# Patient Record
Sex: Male | Born: 1995
Health system: Southern US, Community
[De-identification: ages and names within clinical notes are randomized; demographics above are authoritative.]

## PROBLEM LIST (undated history)

## (undated) DIAGNOSIS — F909 Attention-deficit hyperactivity disorder, unspecified type: Secondary | ICD-10-CM

## (undated) DIAGNOSIS — J0391 Acute recurrent tonsillitis, unspecified: Secondary | ICD-10-CM

## (undated) DIAGNOSIS — Z889 Allergy status to unspecified drugs, medicaments and biological substances status: Secondary | ICD-10-CM

## (undated) DIAGNOSIS — Z789 Other specified health status: Secondary | ICD-10-CM

## (undated) DIAGNOSIS — B20 Human immunodeficiency virus [HIV] disease: Secondary | ICD-10-CM

## (undated) DIAGNOSIS — K08409 Partial loss of teeth, unspecified cause, unspecified class: Secondary | ICD-10-CM

## (undated) HISTORY — PX: MOUTH SURGERY: SHX715

## (undated) HISTORY — PX: WISDOM TOOTH EXTRACTION: SHX21

## (undated) HISTORY — DX: Attention-deficit hyperactivity disorder, unspecified type: F90.9

## (undated) HISTORY — PX: TONSILLECTOMY: SUR1361

---
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
## 2014-06-08 ENCOUNTER — Encounter (HOSPITAL_COMMUNITY): Payer: Self-pay | Admitting: Emergency Medicine

## 2014-06-08 ENCOUNTER — Emergency Department (HOSPITAL_COMMUNITY)
Admission: EM | Admit: 2014-06-08 | Discharge: 2014-06-08 | Disposition: A | Discharge status: Home / Self Care | Payer: Medicaid Other | Attending: Emergency Medicine | Admitting: Emergency Medicine

## 2014-06-08 DIAGNOSIS — R300 Dysuria: Secondary | ICD-10-CM | POA: Insufficient documentation

## 2014-06-08 DIAGNOSIS — F17200 Nicotine dependence, unspecified, uncomplicated: Secondary | ICD-10-CM | POA: Insufficient documentation

## 2014-06-08 DIAGNOSIS — N342 Other urethritis: Secondary | ICD-10-CM | POA: Insufficient documentation

## 2014-06-08 DIAGNOSIS — Z8719 Personal history of other diseases of the digestive system: Secondary | ICD-10-CM | POA: Diagnosis not present

## 2014-06-08 LAB — URINALYSIS, ROUTINE W REFLEX MICROSCOPIC
BILIRUBIN URINE: NEGATIVE
GLUCOSE, UA: NEGATIVE mg/dL
HGB URINE DIPSTICK: NEGATIVE
Ketones, ur: NEGATIVE mg/dL
Leukocytes, UA: NEGATIVE
Nitrite: NEGATIVE
Protein, ur: NEGATIVE mg/dL
SPECIFIC GRAVITY, URINE: 1.025 (ref 1.005–1.030)
UROBILINOGEN UA: 0.2 mg/dL (ref 0.0–1.0)
pH: 5.5 (ref 5.0–8.0)

## 2014-06-08 MED ORDER — LIDOCAINE HCL (PF) 1 % IJ SOLN
INTRAMUSCULAR | Status: AC
  Administered 2014-06-08: 1 mL
  Filled 2014-06-08: qty 5

## 2014-06-08 MED ORDER — METRONIDAZOLE 500 MG PO TABS
2000.0000 mg | ORAL_TABLET | Freq: Once | ORAL | Status: AC
  Administered 2014-06-08: 2000 mg via ORAL
  Filled 2014-06-08: qty 4

## 2014-06-08 MED ORDER — CEFTRIAXONE SODIUM 250 MG IJ SOLR
250.0000 mg | Freq: Once | INTRAMUSCULAR | Status: AC
  Administered 2014-06-08: 250 mg via INTRAMUSCULAR
  Filled 2014-06-08: qty 250

## 2014-06-08 MED ORDER — AZITHROMYCIN 1 G PO PACK
1.0000 g | PACK | Freq: Once | ORAL | Status: AC
  Administered 2014-06-08: 1 g via ORAL
  Filled 2014-06-08: qty 1

## 2014-06-08 NOTE — ED Provider Notes (Signed)
CSN: [id number]     Arrival date & time 06/08/14  2053 History   First MD Initiated Contact with Patient 06/08/14 2120    This chart was scribed for Benny Lennert, MD by Marica Otter, ED Scribe. This patient was seen in room APA08/APA08 and the patient's care was started at 9:39 PM.  Chief Complaint  Patient presents with  . Dysuria   Patient is a 18 y.o. male presenting with dysuria. The history is provided by the patient. No language interpreter was used.  Dysuria This is a new problem. The current episode started 12 to 24 hours ago. The problem occurs constantly. The problem has not changed since onset.Pertinent negatives include no chest pain, no abdominal pain and no headaches. Nothing aggravates the symptoms. Nothing relieves the symptoms. He has tried nothing for the symptoms.   PCP: Colette Ribas, MD HPI Comments: Julian Sanchez is a 18 y.o. male who presents to the Emergency Department complaining of dysuria onset today. Pt reports that ex-girlfriend recently informed him that she was Dx with chlamydia and trich, pt fears he may have the same.   Past Medical History  Diagnosis Date  . Wisdom teeth extracted   . No pertinent past medical history    Past Surgical History  Procedure Laterality Date  . Mouth surgery    . Irrigation and debridement abscess  09/28/2012    Procedure: IRRIGATION AND DEBRIDEMENT ABSCESS;  Surgeon: Flo Shanks, MD;  Location: [facility] OR;  Service: ENT;  Laterality: Right;  Right Peritonsillar Abscess   History reviewed. No pertinent family history. History  Substance Use Topics  . Smoking status: Heavy Tobacco Smoker -- 0.50 packs/day    Types: Cigarettes  . Smokeless tobacco: Not on file  . Alcohol Use: No    Review of Systems  Constitutional: Negative for appetite change and fatigue.  HENT: Negative for congestion, ear discharge and sinus pressure.   Eyes: Negative for discharge.  Respiratory: Negative for cough.   Cardiovascular:  Negative for chest pain.  Gastrointestinal: Negative for abdominal pain and diarrhea.  Genitourinary: Positive for dysuria. Negative for frequency and hematuria.  Musculoskeletal: Negative for back pain.  Skin: Negative for rash.  Neurological: Negative for seizures and headaches.  Psychiatric/Behavioral: Negative for hallucinations.      Allergies  Review of patient's allergies indicates no known allergies.  Home Medications   Prior to Admission medications   Not on File   Triage Vitals: BP 119/62  Pulse 75  Temp(Src) 99.1 F (37.3 C) (Oral)  Resp 18  Ht  (1.854 m)  Wt 144 lb (65.318 kg)  BMI 19.00 kg/m2  SpO2 100% Physical Exam  Nursing note and vitals reviewed. Constitutional: He is oriented to person, place, and time. He appears well-developed and well-nourished. No distress.  HENT:  Head: Normocephalic and atraumatic.  Eyes: Conjunctivae and EOM are normal.  Neck: Neck supple. No tracheal deviation present.  Cardiovascular: Normal rate.   Pulmonary/Chest: Effort normal. No respiratory distress.  Musculoskeletal: Normal range of motion.  Neurological: He is alert and oriented to person, place, and time.  Skin: Skin is warm and dry.  Psychiatric: He has a normal mood and affect. His behavior is normal.    ED Course  Procedures (including critical care time) DIAGNOSTIC STUDIES: Oxygen Saturation is 100% on RA, nl by my interpretation.    COORDINATION OF CARE: 9:42 PM-Discussed treatment plan which includes UA with pt at bedside and pt agreed to plan.   Labs  Review Labs Reviewed  URINALYSIS, ROUTINE W REFLEX MICROSCOPIC    Imaging Review No results found.   EKG Interpretation None      MDM   Final diagnoses:  None   Urethritis   The chart was scribed for me under my direct supervision.  I personally performed the history, physical, and medical decision making and all procedures in the evaluation of this patient.Benny Lennert,  MD 06/08/14 [phone]

## 2014-06-08 NOTE — ED Notes (Signed)
Dysuria, onset today.  Girlfriend dx with chlamydia and trich.

## 2014-06-08 NOTE — Discharge Instructions (Signed)
Follow up with your md next week. °

## 2014-06-12 LAB — GC/CHLAMYDIA PROBE AMP
CT PROBE, AMP APTIMA: NEGATIVE
GC PROBE AMP APTIMA: NEGATIVE

## 2014-08-10 ENCOUNTER — Emergency Department (HOSPITAL_COMMUNITY)
Admission: EM | Admit: 2014-08-10 | Discharge: 2014-08-10 | Disposition: A | Discharge status: Home / Self Care | Payer: Medicaid Other | Attending: Emergency Medicine | Admitting: Emergency Medicine

## 2014-08-10 ENCOUNTER — Encounter (HOSPITAL_COMMUNITY): Payer: Self-pay | Admitting: Emergency Medicine

## 2014-08-10 DIAGNOSIS — R369 Urethral discharge, unspecified: Secondary | ICD-10-CM | POA: Diagnosis present

## 2014-08-10 DIAGNOSIS — Z720 Tobacco use: Secondary | ICD-10-CM | POA: Diagnosis not present

## 2014-08-10 MED ORDER — CEFTRIAXONE SODIUM 250 MG IJ SOLR
250.0000 mg | Freq: Once | INTRAMUSCULAR | Status: AC
  Administered 2014-08-10: 250 mg via INTRAMUSCULAR
  Filled 2014-08-10: qty 250

## 2014-08-10 MED ORDER — METRONIDAZOLE 500 MG PO TABS
2000.0000 mg | ORAL_TABLET | Freq: Once | ORAL | Status: AC
  Administered 2014-08-10: 2000 mg via ORAL
  Filled 2014-08-10: qty 4

## 2014-08-10 MED ORDER — LIDOCAINE HCL (PF) 1 % IJ SOLN
INTRAMUSCULAR | Status: AC
  Filled 2014-08-10: qty 5

## 2014-08-10 MED ORDER — AZITHROMYCIN 250 MG PO TABS
1000.0000 mg | ORAL_TABLET | Freq: Once | ORAL | Status: AC
  Administered 2014-08-10: 1000 mg via ORAL
  Filled 2014-08-10: qty 4

## 2014-08-10 MED ORDER — LIDOCAINE HCL (PF) 1 % IJ SOLN
0.9000 mL | Freq: Once | INTRAMUSCULAR | Status: AC
  Administered 2014-08-10: 0.9 mL via INTRADERMAL

## 2014-08-10 MED ORDER — ONDANSETRON 4 MG PO TBDP
8.0000 mg | ORAL_TABLET | Freq: Once | ORAL | Status: AC
  Administered 2014-08-10: 8 mg via ORAL
  Filled 2014-08-10: qty 2

## 2014-08-10 NOTE — ED Notes (Signed)
Pt reports penile discharge that started last night.

## 2014-08-10 NOTE — ED Provider Notes (Signed)
CSN: [id number]     Arrival date & time 08/10/14  1759 History  This chart was scribed for Julian DredgeEmily Cato Liburd, PA working with Raelyn NumberKristen N Ward, DO found by Elon SpannerGarrett Cook, ED Scribe. This patient was seen in room TR09C/TR09C and the patient's care was started at 6:27 PM.  Chief Complaint  Patient presents with  . SEXUALLY TRANSMITTED DISEASE   The history is provided by the patient. No language interpreter was used.  HPI Comments: Julian RepressDevonte R Sanchez is a 18 y.o. male who presents to the Emergency Department complaining of creamy white penile discharge with associated increased urinary frequency onset yesterday.  Patient reports regular STD testing, most recently last month.  He states he has had a new sexual partner and has had unprotected sex since his last negative STD test.  Patient denies dysuria, testicular pain/swelling, abdominal pain, fevers.    Past Medical History  Diagnosis Date  . Wisdom teeth extracted   . No pertinent past medical history    Past Surgical History  Procedure Laterality Date  . Mouth surgery    . Irrigation and debridement abscess  09/28/2012    Procedure: IRRIGATION AND DEBRIDEMENT ABSCESS;  Surgeon: Flo ShanksKarol Wolicki, MD;  Location[facility] OR;  Service: ENT;  Laterality: Right;  Right Peritonsillar Abscess   History reviewed. No pertinent family history. History  Substance Use Topics  . Smoking status: Heavy Tobacco Smoker -- 0.50 packs/day    Types: Cigarettes  . Smokeless tobacco: Not on file  . Alcohol Use: No    Review of Systems  Constitutional: Negative for fever.  Gastrointestinal: Negative for abdominal pain.  Genitourinary: Positive for discharge. Negative for dysuria, urgency, frequency, penile swelling, scrotal swelling, penile pain and testicular pain.  Musculoskeletal: Negative for back pain.  Skin: Negative for rash.  Allergic/Immunologic: Negative for immunocompromised state.      Allergies  Review of patient's allergies indicates no known  allergies.  Home Medications   Prior to Admission medications   Not on File   BP 120/74  Pulse 93  Temp(Src) 97.7 F (36.5 C) (Oral)  Resp 18  SpO2 99% Physical Exam  Nursing note and vitals reviewed. Constitutional: He appears well-developed and well-nourished. No distress.  HENT:  Head: Normocephalic and atraumatic.  Neck: Neck supple.  Pulmonary/Chest: Effort normal.  Abdominal: Soft. He exhibits no distension. There is no tenderness. There is no rebound and no guarding.  Genitourinary: Testes normal. Right testis shows no mass, no swelling and no tenderness. Right testis is descended. Left testis shows no mass, no swelling and no tenderness. Left testis is descended. Circumcised. No phimosis, paraphimosis, hypospadias, penile erythema or penile tenderness. Discharge found.  Thick white discharge from the urethra  Lymphadenopathy:       Right: No inguinal adenopathy present.       Left: No inguinal adenopathy present.  Neurological: He is alert.  Skin: He is not diaphoretic.    ED Course  Procedures (including critical care time)  DIAGNOSTIC STUDIES: Oxygen Saturation is 99% on RA, normal by my interpretation.    COORDINATION OF CARE:  6:49 PM Will screen for STD's.  Will order anti-emetics.  Patient acknowledges and agrees with plan.    Labs Review Labs Reviewed - No data to display  Imaging Review No results found.   EKG Interpretation None      MDM   Final diagnoses:  Penile discharge    Afebrile, nontoxic patient with penile discharge that developed after unprotected sexual intercourse.  STD panel  ordered.  Tx for GC/Chlam and trichomonas.  Advised sexual partners must also be tested and treated.   D/C home with health department information for follow up.  Discussed result, findings, treatment, and follow up  with patient.  Pt given return precautions.  Pt verbalizes understanding and agrees with plan.       I personally performed the services  described in this documentation, which was scribed in my presence. The recorded information has been reviewed and is accurate.    Julian Dredgemily Hosanna Betley, PA-C 08/10/14 2108

## 2014-08-10 NOTE — ED Provider Notes (Signed)
Medical screening examination/treatment/procedure(s) were performed by non-physician practitioner and as supervising physician I was immediately available for consultation/collaboration.   EKG Interpretation None        Dionicia Cerritos N Sharonann Malbrough, DO 08/10/14 2325 

## 2014-08-10 NOTE — Discharge Instructions (Signed)
Read the information below.  You may return to the Emergency Department at any time for worsening condition or any new symptoms that concern you.  If you develop fevers, abdominal or testicular pain, testicular swelling, or difficulty urinating, return to the ER for a recheck.    You were treated for gonorrhea, chlamydia, and trichomonas today.  These are common sexually transmitted infections.  All of your tests are still pending.  Please see your results on My Chart or contact medical records for the results next week.  All sexual partners must also be tested and treated.  Do not engage in any sexual activity for 2 weeks after the last person was treated.

## 2014-08-11 LAB — RPR

## 2014-08-11 LAB — HIV ANTIBODY (ROUTINE TESTING W REFLEX): HIV: NONREACTIVE

## 2014-08-14 LAB — GC/CHLAMYDIA PROBE AMP
CT PROBE, AMP APTIMA: INVALID
GC PROBE AMP APTIMA: INVALID

## 2015-08-01 ENCOUNTER — Ambulatory Visit (INDEPENDENT_AMBULATORY_CARE_PROVIDER_SITE_OTHER): Payer: Self-pay

## 2015-08-01 DIAGNOSIS — B20 Human immunodeficiency virus [HIV] disease: Secondary | ICD-10-CM

## 2015-08-01 DIAGNOSIS — Z23 Encounter for immunization: Secondary | ICD-10-CM

## 2015-08-01 LAB — COMPLETE METABOLIC PANEL WITH GFR
ALT: 11 U/L (ref 9–46)
AST: 17 U/L (ref 10–40)
Albumin: 4.7 g/dL (ref 3.6–5.1)
Alkaline Phosphatase: 52 U/L (ref 40–115)
BUN: 8 mg/dL (ref 7–25)
CHLORIDE: 101 mmol/L (ref 98–110)
CO2: 28 mmol/L (ref 20–31)
CREATININE: 0.74 mg/dL (ref 0.60–1.35)
Calcium: 9.4 mg/dL (ref 8.6–10.3)
GFR, Est African American: >89 mL/min (ref >=60)
GFR, Est Non African American: >89 mL/min (ref >=60)
Glucose, Bld: 75 mg/dL (ref 65–99)
POTASSIUM: 4.1 mmol/L (ref 3.5–5.3)
SODIUM: 135 mmol/L (ref 135–146)
Total Bilirubin: 0.5 mg/dL (ref 0.2–1.2)
Total Protein: 8.1 g/dL (ref 6.1–8.1)

## 2015-08-01 LAB — LIPID PANEL
Cholesterol: 107 mg/dL — ABNORMAL LOW (ref 125–200)
HDL: 29 mg/dL — AB (ref >=40)
LDL Cholesterol: 58 mg/dL (ref <130)
TRIGLYCERIDES: 101 mg/dL (ref <150)
Total CHOL/HDL Ratio: 3.7 Ratio (ref <=5.0)
VLDL: 20 mg/dL (ref <30)

## 2015-08-01 NOTE — Progress Notes (Signed)
19 year old [place] male here today for newly diagnosed HIV intake. Patient is accompanied by his boyfriend's Mother. He states that she is a great support system for him, and is knowledgeable about HIV because her son is HIV positive. Patient complains of weight loss, approximately 2 pant sizes, but denies any other symptoms like thrush, fatigue, diarrhea, rash. Patient was notified by the [facility], after trying to donate and was later notified by the Health Department where it was confirmed. Flu and pneumonia vaccines were given today and patient is scheduled for 2 weeks with Dr. Orvan Falconerampbell.

## 2015-08-02 DIAGNOSIS — Z23 Encounter for immunization: Secondary | ICD-10-CM

## 2015-08-02 LAB — CBC WITH DIFFERENTIAL/PLATELET
BASOS ABS: 0 10*3/uL (ref 0.0–0.1)
BASOS PCT: 1 % (ref 0–1)
EOS ABS: 0.1 10*3/uL (ref 0.0–0.7)
EOS PCT: 3 % (ref 0–5)
HCT: 43.3 % (ref 39.0–52.0)
Hemoglobin: 14.5 g/dL (ref 13.0–17.0)
LYMPHS PCT: 27 % (ref 12–46)
Lymphs Abs: 1.3 10*3/uL (ref 0.7–4.0)
MCH: 26.6 pg (ref 26.0–34.0)
MCHC: 33.5 g/dL (ref 30.0–36.0)
MCV: 79.4 fL (ref 78.0–100.0)
MPV: 10.1 fL (ref 8.6–12.4)
Monocytes Absolute: 0.5 10*3/uL (ref 0.1–1.0)
Monocytes Relative: 10 % (ref 3–12)
Neutro Abs: 2.8 10*3/uL (ref 1.7–7.7)
Neutrophils Relative %: 59 % (ref 43–77)
PLATELETS: 161 10*3/uL (ref 150–400)
RBC: 5.45 MIL/uL (ref 4.22–5.81)
RDW: 12.5 % (ref 11.5–15.5)
WBC: 4.7 10*3/uL (ref 4.0–10.5)

## 2015-08-02 LAB — T-HELPER CELL (CD4) - (RCID CLINIC ONLY)
CD4 T CELL HELPER: 23 % — AB (ref 33–55)
CD4 T Cell Abs: 440 /uL (ref 400–2700)

## 2015-08-02 LAB — HIV-1 RNA ULTRAQUANT REFLEX TO GENTYP+
HIV 1 RNA QUANT: 43695 {copies}/mL — AB (ref <20)
HIV-1 RNA Quant, Log: 4.64 Log copies/mL — ABNORMAL HIGH (ref <1.30)

## 2015-08-02 LAB — RPR

## 2015-08-02 LAB — HEPATITIS B SURFACE ANTIGEN: HEP B S AG: NEGATIVE

## 2015-08-02 LAB — HEPATITIS B SURFACE ANTIBODY,QUALITATIVE: Hep B S Ab: NEGATIVE

## 2015-08-02 LAB — HEPATITIS C ANTIBODY: HCV AB: NEGATIVE

## 2015-08-02 LAB — HEPATITIS B CORE ANTIBODY, TOTAL: HEP B C TOTAL AB: NONREACTIVE

## 2015-08-02 LAB — HEPATITIS A ANTIBODY, TOTAL: HEP A TOTAL AB: NONREACTIVE

## 2015-08-02 MED ORDER — PNEUMOCOCCAL VAC POLYVALENT 25 MCG/0.5ML IJ INJ: 0.5000 mL | INJECTION | Freq: Once | INTRAMUSCULAR | Status: DC

## 2015-08-03 LAB — QUANTIFERON TB GOLD ASSAY (BLOOD)
INTERFERON GAMMA RELEASE ASSAY: NEGATIVE
Mitogen value: >10 IU/mL
QUANTIFERON NIL VALUE: 0.03 [IU]/mL
Quantiferon Tb Ag Minus Nil Value: 0 IU/mL
TB AG VALUE: 0.03 [IU]/mL

## 2015-08-03 LAB — URINALYSIS
Bilirubin Urine: NEGATIVE
Glucose, UA: NEGATIVE
HGB URINE DIPSTICK: NEGATIVE
KETONES UR: NEGATIVE
Leukocytes, UA: NEGATIVE
Nitrite: NEGATIVE
Protein, ur: NEGATIVE
Specific Gravity, Urine: 1.019 (ref 1.001–1.035)
pH: 7 (ref 5.0–8.0)

## 2015-08-05 LAB — URINE CYTOLOGY ANCILLARY ONLY
CHLAMYDIA, DNA PROBE: NEGATIVE
NEISSERIA GONORRHEA: NEGATIVE

## 2015-08-08 LAB — HLA B*5701: HLA-B*5701 w/rflx HLA-B High: NEGATIVE

## 2015-08-13 LAB — HIV-1 GENOTYPR PLUS

## 2015-08-14 ENCOUNTER — Encounter (HOSPITAL_COMMUNITY): Payer: Self-pay | Admitting: *Deleted

## 2015-08-14 ENCOUNTER — Emergency Department (HOSPITAL_COMMUNITY)
Admission: EM | Admit: 2015-08-14 | Discharge: 2015-08-14 | Disposition: A | Discharge status: Home / Self Care | Payer: Medicaid Other | Attending: Emergency Medicine | Admitting: Emergency Medicine

## 2015-08-14 DIAGNOSIS — Z720 Tobacco use: Secondary | ICD-10-CM | POA: Insufficient documentation

## 2015-08-14 DIAGNOSIS — Z21 Asymptomatic human immunodeficiency virus [HIV] infection status: Secondary | ICD-10-CM | POA: Diagnosis not present

## 2015-08-14 DIAGNOSIS — L988 Other specified disorders of the skin and subcutaneous tissue: Secondary | ICD-10-CM | POA: Diagnosis not present

## 2015-08-14 DIAGNOSIS — R600 Localized edema: Secondary | ICD-10-CM | POA: Diagnosis present

## 2015-08-14 DIAGNOSIS — K130 Diseases of lips: Secondary | ICD-10-CM

## 2015-08-14 HISTORY — DX: Human immunodeficiency virus (HIV) disease: B20

## 2015-08-14 MED ORDER — MUPIROCIN CALCIUM 2 % EX CREA
TOPICAL_CREAM | Freq: Three times a day (TID) | CUTANEOUS | Status: DC
  Administered 2015-08-14: 1 via TOPICAL
  Filled 2015-08-14: qty 15

## 2015-08-14 NOTE — ED Provider Notes (Signed)
CSN: [id number]     Arrival date & time 08/14/15  1923 History   First MD Initiated Contact with Patient 08/14/15 1937     Chief Complaint  Patient presents with  . Oral Swelling     (Consider location/radiation/quality/duration/timing/severity/associated sxs/prior Treatment) The history is provided by the patient. No language interpreter was used.   Julian Sanchez is a 19 year old male with a history of right peritonsillar abscess and oral surgery in 2013 by Dr. Lazarus Salines and HIV who presents for right lip cracking with tenderness when opening his mouth that began this morning. He states it has been bleeding and is painful. He states he recently got over a cold. He denies any fever, chills, history of oral herpes.   Past Medical History  Diagnosis Date  . Wisdom teeth extracted   . No pertinent past medical history   . HIV disease [facility])    Past Surgical History  Procedure Laterality Date  . Mouth surgery    . Irrigation and debridement abscess  09/28/2012    Procedure: IRRIGATION AND DEBRIDEMENT ABSCESS;  Surgeon: Flo Shanks, MD;  Location: [facility] OR;  Service: ENT;  Laterality: Right;  Right Peritonsillar Abscess   Family History  Problem Relation Age of Onset  . Diabetes Mother    Social History  Substance Use Topics  . Smoking status: Heavy Tobacco Smoker -- 0.50 packs/day    Types: Cigarettes  . Smokeless tobacco: None  . Alcohol Use: No    Review of Systems  Constitutional: Negative for fever and chills.  Skin: Positive for wound.      Allergies  Review of patient's allergies indicates no known allergies.  Home Medications   Prior to Admission medications   Not on File   BP 101/66 mmHg  Pulse 87  Temp(Src) 98.2 F (36.8 C) (Oral)  Resp 16  Ht 6' (1.829 m)  Wt 134 lb 5 oz (60.924 kg)  BMI 18.21 kg/m2  SpO2 99% Physical Exam  Constitutional: He is oriented to person, place, and time. He appears well-developed and well-nourished.  HENT:  Head: Normocephalic  and atraumatic.  Eyes: Conjunctivae are normal.  Neck: Neck supple.  Cardiovascular: Normal rate.   Pulmonary/Chest: Effort normal.  Musculoskeletal: Normal range of motion.  Neurological: He is alert and oriented to person, place, and time.  Skin: Skin is warm and dry.  Cracking and inflammation of the lateral commission or of the mouth with fissuring. No active bleeding or drainage. The area is tender but there is no erythema or surrounding redness.  Psychiatric: He has a normal mood and affect. His behavior is normal.  Nursing note and vitals reviewed.   ED Course  Procedures (including critical care time) Labs Review Labs Reviewed - No data to display  Imaging Review No results found.    EKG Interpretation None      MDM   Final diagnoses:  Cracked lips  Patient with history of HIV who presents with pain at the lateral right commission or of the mouth. No signs of infection. He is afebrile and well-appearing. I do not believe this is herpes. Filed Vitals:   08/14/15 2100  BP: 101/66  Pulse: 87  Temp: 98.2 F (36.8 C)  Resp: 16    These can sometimes be called by secondary infection. I will place the patient on Bactroban to keep the area moist and to prevent infection. I discussed follow-up with the patient and he states that he has no point with a new physician. I  also discussed return precautions and the patient verbally agrees with the plan.     Catha GosselinHanna Patel-Mills, PA-C 08/15/15 0109  Lavera Guiseana Duo Liu, MD 08/16/15 1158

## 2015-08-14 NOTE — Discharge Instructions (Signed)
Follow-up with your primary care physician within 2 days for recheck. Return for fever, increased swelling or redness around the lip. Use Bactroban.

## 2015-08-14 NOTE — ED Notes (Signed)
The pt is c/o a lesion on his lower lip since yesterday.

## 2015-08-19 ENCOUNTER — Ambulatory Visit (INDEPENDENT_AMBULATORY_CARE_PROVIDER_SITE_OTHER): Payer: Medicaid Other | Admitting: Internal Medicine

## 2015-08-19 ENCOUNTER — Encounter: Payer: Self-pay | Admitting: Internal Medicine

## 2015-08-19 VITALS — BP 151/81 | HR 68 | Temp 98.5°F | Ht 72.0 in | Wt 143.2 lb

## 2015-08-19 DIAGNOSIS — F17210 Nicotine dependence, cigarettes, uncomplicated: Secondary | ICD-10-CM

## 2015-08-19 DIAGNOSIS — B001 Herpesviral vesicular dermatitis: Secondary | ICD-10-CM

## 2015-08-19 DIAGNOSIS — Z23 Encounter for immunization: Secondary | ICD-10-CM

## 2015-08-19 DIAGNOSIS — B20 Human immunodeficiency virus [HIV] disease: Secondary | ICD-10-CM | POA: Diagnosis not present

## 2015-08-19 DIAGNOSIS — Z720 Tobacco use: Secondary | ICD-10-CM | POA: Diagnosis not present

## 2015-08-19 MED ORDER — EMTRICITABINE-TENOFOVIR AF 200-25 MG PO TABS: 1.0000 | ORAL_TABLET | Freq: Every day | ORAL | Status: DC

## 2015-08-19 MED ORDER — DOLUTEGRAVIR SODIUM 50 MG PO TABS: 50.0000 mg | ORAL_TABLET | Freq: Every day | ORAL | Status: DC

## 2015-08-19 NOTE — Progress Notes (Signed)
Patient Active Problem List   Diagnosis Date Noted  . HIV disease ([place]) 08/19/2015    Priority: High  . 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 Cigarette smoker 08/19/2015    Patient's Medications  New Prescriptions   No medications on file  Previous Medications   No medications on file  Modified Medications   Modified Medication Previous Medication   DOLUTEGRAVIR (TIVICAY) 50 MG TABLET dolutegravir (TIVICAY) 50 MG tablet      Take 1 tablet (50 mg total) by mouth daily.    Take 50 mg by mouth daily.   EMTRICITABINE-TENOFOVIR AF (DESCOVY) 200-25 MG TABLET emtricitabine-tenofovir AF (DESCOVY) 200-25 MG tablet      Take 1 tablet by mouth daily.    Take 1 tablet by mouth daily.  Discontinued Medications   No medications on file    Subjective: Julian Sanchez is in for his initial visit to establish ongoing care for his recently diagnosed HIV infection. He is exclusively gay and undergoes routine HIV testing. He states that he tested negative about 4 months ago but then was called recently and told that his test was positive after he donated plasma. He believes he knows who infected him. He thinks it was a previous partner who never told him about his HIV infection. Since learning of his test results he has shared that information with his best girl friend whose brother had HIV infection. Unfortunately he never took his medication and died of complications. He feels that she has a great source of support for him. He is also told his current partner who he started dating more recently. He is HIV positive and followed here on [place]. He has not been sexually active since he learned of his infection. He is close to his family but has not chosen to tell his parents or his 5 older siblings.  He has been in good health other than mild seasonal allergies. He is on no current medications other than over-the-counter melatonin which she takes to regulate his sleep. He smokes cigarettes and has no current  plan to quit. He occasionally smokes marijuana but no other drugs. He does not drink any alcohol. He currently works at [employer]. His hours of work very greatly from day-to-day as do his eating habits. He is currently living with a friend who is in section 8 housing so he needs to find stable housing of his own.  He had a history of ADHD when he was young and took Concerta. He also took Seroquel because the Concerta. Difficult for him to sleep. He has never had any previous periods of depression or anxiety but states that he has had dramatic mood swings since he learned of his HIV diagnosis. He states that he has been mad, sad and tearful. He has had no thoughts of hurting himself or others.  Review of Systems: Review of Systems  Constitutional: Negative for fever, chills, weight loss, malaise/fatigue and diaphoresis.  HENT: Negative for sore throat.   Respiratory: Negative for cough, sputum production and shortness of breath.   Cardiovascular: Negative for chest pain.  Gastrointestinal: Negative for nausea, vomiting and diarrhea.  Genitourinary: Negative for dysuria and frequency.  Musculoskeletal: Negative for myalgias and joint pain.  Skin: Negative for rash.  Neurological: Negative for focal weakness.  Psychiatric/Behavioral: Negative for depression and substance abuse. The patient is not nervous/anxious.     Past Medical History  Diagnosis Date  . Wisdom teeth extracted   . No  pertinent past medical history   . HIV disease ([place])   . ADHD (attention deficit hyperactivity disorder)     Social History  Substance Use Topics  . Smoking status: Light Tobacco Smoker -- 0.40 packs/day    Types: Cigarettes  . Smokeless tobacco: Never Used  . Alcohol Use: No    Family History  Problem Relation Age of Onset  . Diabetes Mother     No Known Allergies  Objective:  Filed Vitals:   08/19/15 1411  BP: 151/81  Pulse: 68  Temp: 98.5 F (36.9 C)  TempSrc: Oral  Height: 6' (1.829  m)  Weight: 143 lb 4 oz (64.978 kg)   Body mass index is 19.42 kg/(m^2).  Physical Exam  Constitutional: He is oriented to person, place, and time.  He is well dressed and in no distress.  Eyes: Conjunctivae are normal.  Cardiovascular: Normal rate and regular rhythm.   No murmur heard. Pulmonary/Chest: Breath sounds normal.  Abdominal: Soft. He exhibits no mass. There is no tenderness.  Musculoskeletal: Normal range of motion.  Lymphadenopathy:    He has no cervical adenopathy.    He has no axillary adenopathy.       Right: No epitrochlear adenopathy present.       Left: No epitrochlear adenopathy present.  Neurological: He is alert and oriented to person, place, and time.  Skin: No rash noted.  Psychiatric: Mood and affect normal.    Lab Results Lab Results  Component Value Date   WBC 4.7 08/01/2015   HGB 14.5 08/01/2015   HCT 43.3 08/01/2015   MCV 79.4 08/01/2015   PLT 161 08/01/2015    Lab Results  Component Value Date   CREATININE 0.74 08/01/2015   BUN 8 08/01/2015   NA 135 08/01/2015   K 4.1 08/01/2015   CL 101 08/01/2015   CO2 28 08/01/2015    Lab Results  Component Value Date   ALT 11 08/01/2015   AST 17 08/01/2015   ALKPHOS 52 08/01/2015   BILITOT 0.5 08/01/2015    Lab Results  Component Value Date   CHOL 107* 08/01/2015   HDL 29* 08/01/2015   LDLCALC 58 08/01/2015   TRIG 101 08/01/2015   CHOLHDL 3.7 08/01/2015    Lab Results HIV 1 RNA QUANT (copies/mL)  Date Value  08/01/2015 43695*   CD4 T CELL ABS (/uL)  Date Value  08/01/2015 440      Problem List Items Addressed This Visit      High   HIV disease ([place])    He has newly diagnosed HIV infection that was probably acquired recently. He has no recent illness compatible with primary infection. Fortunately his CD4 count is normal. I have begun the process of education about how to interpret CD4 count and viral load. I talked to him about the importance of careful partners collection and  condom use going forward. We reviewed treatment options with him. He will start on Descovy and Tivicay. He prefers to have a regimen with small pills and something he can take without food. He has gone over treatment adherence with our pharmacist, Onnie Boer.  He was started on hepatitis A and B vaccines today. He recently received his influenza vaccine.  He has some situational depression and unstable housing. He has been scheduled to meet with our behavioral health counselor within the next week and he met with our care manager here today. He will follow-up with me in 3 weeks.      Relevant  Medications   dolutegravir (TIVICAY) 50 MG tablet   emtricitabine-tenofovir AF (DESCOVY) 200-25 MG tablet   Other Relevant Orders   T-helper cell (CD4)- ([facility] clinic only)   HIV 1 RNA quant-no reflex-bld   CBC   Comprehensive metabolic panel     Unprioritized   Cigarette smoker    He is currently not ready to try to quit smoking cigarettes but I did discuss with him the importance of having an overall plan for his health.      Herpes labialis   Relevant Medications   dolutegravir (TIVICAY) 50 MG tablet   emtricitabine-tenofovir AF (DESCOVY) 200-25 MG tablet    Other Visit Diagnoses    Human immunodeficiency virus (HIV) disease ([place])    -  Primary    Relevant Medications    dolutegravir (TIVICAY) 50 MG tablet    emtricitabine-tenofovir AF (DESCOVY) 200-25 MG tablet    Other Relevant Orders    Hepatitis B vaccine adult IM (Completed)    Hepatitis A vaccine adult IM (Completed)         Michel Bickers, MD [facility] [place] [phone] pager   [phone] cell 08/19/2015, 5:33 PM

## 2015-08-19 NOTE — Assessment & Plan Note (Signed)
He is currently not ready to try to quit smoking cigarettes but I did discuss with him the importance of having an overall plan for his health.

## 2015-08-19 NOTE — Assessment & Plan Note (Addendum)
He has newly diagnosed HIV infection that was probably acquired recently. He has no recent illness compatible with primary infection. Fortunately his CD4 count is normal. I have begun the process of education about how to interpret CD4 count and viral load. I talked to him about the importance of careful partners collection and condom use going forward. We reviewed treatment options with him. He will start on Descovy and Tivicay. He prefers to have a regimen with small pills and something he can take without food. He has gone over treatment adherence with our pharmacist, Onnie Boer.  He was started on hepatitis A and B vaccines today. He recently received his influenza vaccine.  He has some situational depression and unstable housing. He has been scheduled to meet with our behavioral health counselor within the next week and he met with our care manager here today. He will follow-up with me in 3 weeks.

## 2015-08-20 ENCOUNTER — Other Ambulatory Visit: Payer: Self-pay | Admitting: *Deleted

## 2015-08-20 DIAGNOSIS — B20 Human immunodeficiency virus [HIV] disease: Secondary | ICD-10-CM

## 2015-08-20 MED ORDER — DOLUTEGRAVIR SODIUM 50 MG PO TABS: 50.0000 mg | ORAL_TABLET | Freq: Every day | ORAL | Status: DC

## 2015-08-20 MED ORDER — EMTRICITABINE-TENOFOVIR AF 200-25 MG PO TABS: 1.0000 | ORAL_TABLET | Freq: Every day | ORAL | Status: DC

## 2015-08-20 NOTE — Progress Notes (Signed)
Patient ID: Julian Sanchez, male   DOB: 06/23/1996, 19 y.o.   MRN: [id number] HPI: Julian Sanchez is a 19 y.o. male who is here for his initial visit for his HIV.   Allergies: No Known Allergies  Vitals: Temp: 98.5 F (36.9 C) (11/07 1411) Temp Source: Oral (11/07 1411) BP: 151/81 mmHg (11/07 1411) Pulse Rate: 68 (11/07 1411)  Past Medical History: Past Medical History  Diagnosis Date  . Wisdom teeth extracted   . No pertinent past medical history   . HIV disease (HCC)   . ADHD (attention deficit hyperactivity disorder)     Social History: Social History   Social History  . Marital Status: Single    Spouse Name: N/A  . Number of Children: N/A  . Years of Education: N/A   Social History Main Topics  . Smoking status: Light Tobacco Smoker -- 0.40 packs/day    Types: Cigarettes  . Smokeless tobacco: Never Used  . Alcohol Use: No  . Drug Use: 7.00 per week    Special: Marijuana     Comment: started smoking "weed" as a freshman in high school, 6 years   . Sexual Activity: Not Currently   Other Topics Concern  . None   Social History Narrative    Previous Regimen: None  Current Regimen: None  Labs: HIV 1 RNA QUANT (copies/mL)  Date Value  08/01/2015 [id number]*   CD4 T CELL ABS (/uL)  Date Value  08/01/2015 440   HEP B S AB (no units)  Date Value  08/01/2015 NEG   HEPATITIS B SURFACE AG (no units)  Date Value  08/01/2015 NEGATIVE   HCV AB (no units)  Date Value  08/01/2015 NEGATIVE    CrCl: Estimated Creatinine Clearance: 134.3 mL/min (by C-G formula based on Cr of 0.74).  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 We discussed several options of  ART with him including Genvoya, Triumeq, and DTG+Descovy. Due to his late work schedule, consistent meals can be difficult. He preferred to take smaller pills so Triumeq is not an option. He much rather take the regimen of DTG+Descovy due to the tablet size and no meal requirement. I stressed the importance of adherence to him. He understood. He is in the process of getting ADAP so [facility].   Recommendations:  Start DTG 50mg  PO qday Start Descovy 1 PO qday [address]  [place] SimsburyPham, [place]h RockdaleQuang, PharmD Clinical Infectious Disease Pharmacist [facility] 08/20/2015, 12:12 AM

## 2015-08-20 NOTE — Progress Notes (Signed)
Patient ID: Julian Sanchez, male   DOB: 07/18/1996, 19 y.o.   MRN: [id number] medication refill - ADAP approved.  [place] [facility] [place]

## 2015-08-20 NOTE — Telephone Encounter (Signed)
medication refill - ADAP approved

## 2015-08-22 ENCOUNTER — Ambulatory Visit: Payer: Medicaid Other

## 2015-08-28 ENCOUNTER — Other Ambulatory Visit: Payer: Self-pay | Admitting: *Deleted

## 2015-08-28 DIAGNOSIS — B20 Human immunodeficiency virus [HIV] disease: Secondary | ICD-10-CM

## 2015-08-28 MED ORDER — DOLUTEGRAVIR SODIUM 50 MG PO TABS: 50.0000 mg | ORAL_TABLET | Freq: Every day | ORAL | Status: DC

## 2015-08-28 MED ORDER — EMTRICITABINE-TENOFOVIR AF 200-25 MG PO TABS: 1.0000 | ORAL_TABLET | Freq: Every day | ORAL | Status: DC

## 2015-09-03 NOTE — Progress Notes (Signed)
Notified [facility] via fax. Charla Criscione M, RN 

## 2015-09-10 ENCOUNTER — Ambulatory Visit (INDEPENDENT_AMBULATORY_CARE_PROVIDER_SITE_OTHER): Payer: Medicaid Other | Admitting: Internal Medicine

## 2015-09-10 ENCOUNTER — Encounter: Payer: Self-pay | Admitting: Internal Medicine

## 2015-09-10 VITALS — BP 116/76 | HR 81 | Temp 98.4°F | Wt 148.0 lb

## 2015-09-10 DIAGNOSIS — F329 Major depressive disorder, single episode, unspecified: Secondary | ICD-10-CM | POA: Diagnosis present

## 2015-09-10 DIAGNOSIS — F32A Depression, unspecified: Secondary | ICD-10-CM

## 2015-09-10 DIAGNOSIS — B20 Human immunodeficiency virus [HIV] disease: Secondary | ICD-10-CM

## 2015-09-10 NOTE — Assessment & Plan Note (Signed)
He is off to a good start with his antiretroviral medications and has not been missing doses. He appears to be motivated to take his medication. I reiterated education about how to interpret his CD4 count and viral load. He will return after repeat blood work in 4 weeks. I will get him rescheduled to see Bernette RedbirdKenny and also gave him written information about our peer support group meetings.

## 2015-09-10 NOTE — Progress Notes (Signed)
Patient Active Problem List   Diagnosis Date Noted  . HIV disease (HCC) 08/19/2015    Priority: High  . Depression 09/10/2015  . 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 Cigarette smoker 08/19/2015    Patient's Medications  New Prescriptions   No medications on file  Previous Medications   DOLUTEGRAVIR (TIVICAY) 50 MG TABLET    Take 1 tablet (50 mg total) by mouth daily.   EMTRICITABINE-TENOFOVIR AF (DESCOVY) 200-25 MG TABLET    Take 1 tablet by mouth daily.  Modified Medications   No medications on file  Discontinued Medications   No medications on file    Subjective: Julian Sanchez is in for his initial follow-up visit. He started on Descovy and Tivicay 2 weeks ago. He takes it each evening with dinner before going to work at 8 PM. He's had no problems tolerating his medication and has not missed any doses. He is still having mood swings. He had to miss his appointment with Franne Forts, our behavioral health counselor, because he was called in to work unexpectedly and new that he could be fired if he did not go.  Review of Systems: Review of Systems  Constitutional: Negative for fever, chills and diaphoresis.  Gastrointestinal: Negative for nausea, vomiting and diarrhea.  Psychiatric/Behavioral: Positive for depression. Negative for substance abuse. The patient is not nervous/anxious.     Past Medical History  Diagnosis Date  . Wisdom teeth extracted   . No pertinent past medical history   . HIV disease (HCC)   . ADHD (attention deficit hyperactivity disorder)     Social History  Substance Use Topics  . Smoking status: Light Tobacco Smoker -- 0.40 packs/day    Types: Cigarettes  . Smokeless tobacco: Never Used  . Alcohol Use: No    Family History  Problem Relation Age of Onset  . Diabetes Mother     No Known Allergies  Objective:  Filed Vitals:   09/10/15 1544  BP: 116/76  Pulse: 81  Temp: 98.4 F (36.9 C)  Weight: 148 lb (67.132 kg)   Body mass index is  20.07 kg/(m^2).  Physical Exam  Constitutional: He is oriented to person, place, and time.  He is smiling and in good spirits.  Eyes: Conjunctivae are normal.  Cardiovascular: Normal rate and regular rhythm.   No murmur heard. Pulmonary/Chest: Breath sounds normal.  Abdominal: Soft. He exhibits no mass. There is no tenderness.  Musculoskeletal: Normal range of motion.  Neurological: He is alert and oriented to person, place, and time.  Skin: No rash noted.  Psychiatric: Mood and affect normal.    Lab Results Lab Results  Component Value Date   WBC 4.7 08/01/2015   HGB 14.5 08/01/2015   HCT 43.3 08/01/2015   MCV 79.4 08/01/2015   PLT 161 08/01/2015    Lab Results  Component Value Date   CREATININE 0.74 08/01/2015   BUN 8 08/01/2015   NA 135 08/01/2015   K 4.1 08/01/2015   CL 101 08/01/2015   CO2 28 08/01/2015    Lab Results  Component Value Date   ALT 11 08/01/2015   AST 17 08/01/2015   ALKPHOS 52 08/01/2015   BILITOT 0.5 08/01/2015    Lab Results  Component Value Date   CHOL 107* 08/01/2015   HDL 29* 08/01/2015   LDLCALC 58 08/01/2015   TRIG 101 08/01/2015   CHOLHDL 3.7 08/01/2015    Lab Results HIV 1 RNA QUANT (copies/mL)  Date  Value  08/01/2015 43695*   CD4 T CELL ABS (/uL)  Date Value  08/01/2015 440      Problem List Items Addressed This Visit      High   HIV disease (HCC)    He is off to a good start with his antiretroviral medications and has not been missing doses. He appears to be motivated to take his medication. I reiterated education about how to interpret his CD4 count and viral load. He will return after repeat blood work in 4 weeks. I will get him rescheduled to see Bernette RedbirdKenny and also gave him written information about our peer support group meetings.      Relevant Orders   T-helper cell (CD4)- ([facility] only)   HIV 1 RNA quant-no reflex-bld     Unprioritized   Depression - Primary        Cliffton AstersJohn Halayna Blane, MD [facility] [phone] pager   [phone] cell 09/10/2015, 4:10 PM

## 2015-09-25 ENCOUNTER — Ambulatory Visit: Payer: Medicaid Other

## 2015-09-28 ENCOUNTER — Emergency Department (HOSPITAL_COMMUNITY)
Admission: EM | Admit: 2015-09-28 | Discharge: 2015-09-28 | Disposition: A | Discharge status: Home / Self Care | Payer: Medicaid Other | Attending: Emergency Medicine | Admitting: Emergency Medicine

## 2015-09-28 ENCOUNTER — Emergency Department (HOSPITAL_COMMUNITY): Payer: Medicaid Other

## 2015-09-28 ENCOUNTER — Encounter (HOSPITAL_COMMUNITY): Payer: Self-pay | Admitting: Emergency Medicine

## 2015-09-28 DIAGNOSIS — Z79899 Other long term (current) drug therapy: Secondary | ICD-10-CM | POA: Diagnosis not present

## 2015-09-28 DIAGNOSIS — K529 Noninfective gastroenteritis and colitis, unspecified: Secondary | ICD-10-CM | POA: Insufficient documentation

## 2015-09-28 DIAGNOSIS — Z8659 Personal history of other mental and behavioral disorders: Secondary | ICD-10-CM | POA: Insufficient documentation

## 2015-09-28 DIAGNOSIS — F17210 Nicotine dependence, cigarettes, uncomplicated: Secondary | ICD-10-CM | POA: Diagnosis not present

## 2015-09-28 DIAGNOSIS — R110 Nausea: Secondary | ICD-10-CM | POA: Diagnosis not present

## 2015-09-28 DIAGNOSIS — B20 Human immunodeficiency virus [HIV] disease: Secondary | ICD-10-CM | POA: Insufficient documentation

## 2015-09-28 DIAGNOSIS — R1030 Lower abdominal pain, unspecified: Secondary | ICD-10-CM | POA: Diagnosis present

## 2015-09-28 LAB — COMPREHENSIVE METABOLIC PANEL
ALBUMIN: 4.3 g/dL (ref 3.5–5.0)
ALK PHOS: 56 U/L (ref 38–126)
ALT: 11 U/L — AB (ref 17–63)
AST: 22 U/L (ref 15–41)
Anion gap: 7 (ref 5–15)
BILIRUBIN TOTAL: 0.7 mg/dL (ref 0.3–1.2)
BUN: <5 mg/dL — ABNORMAL LOW (ref 6–20)
CALCIUM: 9.6 mg/dL (ref 8.9–10.3)
CO2: 24 mmol/L (ref 22–32)
CREATININE: 0.97 mg/dL (ref 0.61–1.24)
Chloride: 104 mmol/L (ref 101–111)
GFR calc Af Amer: >60 mL/min (ref >60)
GFR calc non Af Amer: >60 mL/min (ref >60)
GLUCOSE: 101 mg/dL — AB (ref 65–99)
Potassium: 4.2 mmol/L (ref 3.5–5.1)
SODIUM: 135 mmol/L (ref 135–145)
Total Protein: 7.5 g/dL (ref 6.5–8.1)

## 2015-09-28 LAB — CBC
HEMATOCRIT: 45.4 % (ref 39.0–52.0)
HEMOGLOBIN: 14.7 g/dL (ref 13.0–17.0)
MCH: 26.6 pg (ref 26.0–34.0)
MCHC: 32.4 g/dL (ref 30.0–36.0)
MCV: 82.1 fL (ref 78.0–100.0)
Platelets: 179 10*3/uL (ref 150–400)
RBC: 5.53 MIL/uL (ref 4.22–5.81)
RDW: 13.2 % (ref 11.5–15.5)
WBC: 13.5 10*3/uL — ABNORMAL HIGH (ref 4.0–10.5)

## 2015-09-28 LAB — URINALYSIS, ROUTINE W REFLEX MICROSCOPIC
BILIRUBIN URINE: NEGATIVE
Glucose, UA: NEGATIVE mg/dL
Hgb urine dipstick: NEGATIVE
Ketones, ur: 40 mg/dL — AB
NITRITE: NEGATIVE
PH: 6 (ref 5.0–8.0)
Protein, ur: NEGATIVE mg/dL
SPECIFIC GRAVITY, URINE: 1.026 (ref 1.005–1.030)

## 2015-09-28 LAB — URINE MICROSCOPIC-ADD ON: BACTERIA UA: NONE SEEN

## 2015-09-28 LAB — LIPASE, BLOOD: Lipase: 23 U/L (ref 11–51)

## 2015-09-28 MED ORDER — SODIUM CHLORIDE 0.9 % IV BOLUS (SEPSIS)
1000.0000 mL | Freq: Once | INTRAVENOUS | Status: AC
  Administered 2015-09-28: 1000 mL via INTRAVENOUS

## 2015-09-28 MED ORDER — MORPHINE SULFATE (PF) 4 MG/ML IV SOLN
6.0000 mg | Freq: Once | INTRAVENOUS | Status: AC
Start: 2015-09-28 — End: 2015-09-28
  Administered 2015-09-28: 6 mg via INTRAVENOUS
  Filled 2015-09-28: qty 2

## 2015-09-28 MED ORDER — KETOROLAC TROMETHAMINE 30 MG/ML IJ SOLN
30.0000 mg | Freq: Once | INTRAMUSCULAR | Status: AC
  Administered 2015-09-28: 30 mg via INTRAVENOUS
  Filled 2015-09-28: qty 1

## 2015-09-28 MED ORDER — IOHEXOL 300 MG/ML  SOLN
100.0000 mL | Freq: Once | INTRAMUSCULAR | Status: AC | PRN
Start: 2015-09-28 — End: 2015-09-28
  Administered 2015-09-28: 100 mL via INTRAVENOUS

## 2015-09-28 NOTE — ED Notes (Signed)
Pt sts middle abd pain and diarrhea starting this am

## 2015-09-28 NOTE — ED Notes (Signed)
Patient transported to CT 

## 2015-09-28 NOTE — Discharge Instructions (Signed)
Follow-up closely with infectious disease and primary doctor. Stay well-hydrated.Take tylenol for pain.  If you were given medicines take as directed.  If you are on coumadin or contraceptives realize their levels and effectiveness is altered by many different medicines.  If you have any reaction (rash, tongues swelling, other) to the medicines stop taking and see a physician.    If your blood pressure was elevated in the ER make sure you follow up for management with a primary doctor or return for chest pain, shortness of breath or stroke symptoms.  Please follow up as directed and return to the ER or see a physician for new or worsening symptoms.  Thank you. Filed Vitals:   09/28/15 1800 09/28/15 1830 09/28/15 1841 09/28/15 1845  BP: 129/68 118/67  113/67  Pulse: 109 92 97   Temp:      TempSrc:      Resp:      Height:      Weight:      SpO2: 100% 97% 98%

## 2015-09-28 NOTE — ED Provider Notes (Signed)
CSN: [id number]     Arrival date & time 09/28/15  1401 History   First MD Initiated Contact with Patient 09/28/15 1617     Chief Complaint  Patient presents with  . Abdominal Pain  . Diarrhea     (Consider location/radiation/quality/duration/timing/severity/associated sxs/prior Treatment) HPI Comments: 19 year old male with HIV, herpes, severe smoker presents with worsening suprapubic abdominal pain since this morning sharp constant along with 7 episodes of nonbloody diarrhea. No sick contacts no recent travel. Pain is getting worse and is not resolved. No new foods.  Patient is a 19 y.o. male presenting with abdominal pain and diarrhea. The history is provided by the patient.  Abdominal Pain Associated symptoms: diarrhea and nausea   Associated symptoms: no chest pain, no chills, no dysuria, no fever, no shortness of breath and no vomiting   Diarrhea Associated symptoms: abdominal pain   Associated symptoms: no chills, no fever, no headaches and no vomiting     Past Medical History  Diagnosis Date  . Wisdom teeth extracted   . No pertinent past medical history   . HIV disease (HCC)   . ADHD (attention deficit hyperactivity disorder)    Past Surgical History  Procedure Laterality Date  . Mouth surgery    . Irrigation and debridement abscess  09/28/2012    Procedure: IRRIGATION AND DEBRIDEMENT ABSCESS;  Surgeon: Flo ShanksKarol Wolicki, MD;  [facility];  Service: ENT;  Laterality: Right;  Right Peritonsillar Abscess   Family History  Problem Relation Age of Onset  . Diabetes Mother    Social History  Substance Use Topics  . Smoking status: Light Tobacco Smoker -- 0.40 packs/day    Types: Cigarettes  . Smokeless tobacco: Never Used  . Alcohol Use: No    Review of Systems  Constitutional: Negative for fever and chills.  HENT: Negative for congestion.   Eyes: Negative for visual disturbance.  Respiratory: Negative for shortness of breath.   Cardiovascular: Negative for  chest pain.  Gastrointestinal: Positive for nausea, abdominal pain and diarrhea. Negative for vomiting.  Genitourinary: Negative for dysuria and flank pain.  Musculoskeletal: Negative for back pain, neck pain and neck stiffness.  Skin: Negative for rash.  Neurological: Negative for light-headedness and headaches.      Allergies  Review of patient's allergies indicates no known allergies.  Home Medications   Prior to Admission medications   Medication Sig Start Date End Date Taking? Authorizing Provider  dolutegravir (TIVICAY) 50 MG tablet Take 1 tablet (50 mg total) by mouth daily. 08/28/15   Cliffton AstersJohn Campbell, MD  emtricitabine-tenofovir AF (DESCOVY) 200-25 MG tablet Take 1 tablet by mouth daily. 08/28/15   Cliffton AstersJohn Campbell, MD   BP 113/67 mmHg  Pulse 97  Temp(Src) 98.2 F (36.8 C) (Oral)  Resp 18  Ht 6\' 1"  (1.854 m)  Wt 145 lb (65.772 kg)  BMI 19.13 kg/m2  SpO2 98% Physical Exam  Constitutional: He is oriented to person, place, and time. He appears well-developed and well-nourished.  HENT:  Head: Normocephalic and atraumatic.  Eyes: Conjunctivae are normal. Right eye exhibits no discharge. Left eye exhibits no discharge.  Neck: Normal range of motion. Neck supple. No tracheal deviation present.  Cardiovascular: Normal rate and regular rhythm.   Pulmonary/Chest: Effort normal and breath sounds normal.  Abdominal: Soft. He exhibits no distension. There is tenderness ( suprapubic otter). There is no guarding.  Musculoskeletal: He exhibits no edema.  Neurological: He is alert and oriented to person, place, and time.  Skin: Skin is warm.  No rash noted.  Psychiatric: He has a normal mood and affect.  Nursing note and vitals reviewed.   ED Course  Procedures (including critical care time) Labs Review Labs Reviewed  COMPREHENSIVE METABOLIC PANEL - Abnormal; Notable for the following:    Glucose, Bld 101 (*)    BUN <5 (*)    ALT 11 (*)    All other components within normal  limits  CBC - Abnormal; Notable for the following:    WBC 13.5 (*)    All other components within normal limits  URINALYSIS, ROUTINE W REFLEX MICROSCOPIC (NOT AT [facility]) - Abnormal; Notable for the following:    Color, Urine AMBER (*)    Ketones, ur 40 (*)    Leukocytes, UA SMALL (*)    All other components within normal limits  URINE MICROSCOPIC-ADD ON - Abnormal; Notable for the following:    Squamous Epithelial / LPF 0-5 (*)    All other components within normal limits  LIPASE, BLOOD    Imaging Review Ct Abdomen Pelvis W Contrast  09/28/2015  CLINICAL DATA:  Central abdominal pain. Multiple episodes of diarrhea with blood today. EXAM: CT ABDOMEN AND PELVIS WITH CONTRAST TECHNIQUE: Multidetector CT imaging of the abdomen and pelvis was performed using the standard protocol following bolus administration of intravenous contrast. CONTRAST:  OMNIPAQUE IOHEXOL 300 MG/ML  SOLN COMPARISON:  None. FINDINGS: Lower chest:  The included lung bases are clear. Liver: No focal lesion.  Normal in size and density. Hepatobiliary: Gallbladder physiologically distended, no calcified stone. No biliary dilatation. Pancreas: No ductal dilatation or inflammation. Spleen: Mildly enlarged measuring 13.9 cm craniocaudal dimension. Adrenal glands: No nodule. Kidneys: Horseshoe kidney configuration. There is homogeneous enhancement and symmetric excretion. No hydronephrosis. No evident urolithiasis. Stomach/Bowel: Bowel evaluation limited by a lack of enteric contrast and paucity of intra-abdominal fat. Stomach physiologically distended. There are no dilated or thickened small bowel loops. There is moderate sigmoid colonic wall thickening involving the descending colon in with trace adjacent soft tissue stranding. Minimal liquid stool is seen in the distal sigmoid colon. No evidence of perirectal fluid collection. Portions of the appendix are tentatively identified, minimal high-density material in the mid and distal  appendix is likely related to prior enteric contrast. No appendiceal inflammation or findings acute appendicitis. Vascular/Lymphatic: No retroperitoneal adenopathy. Abdominal aorta is normal in caliber. Reproductive: No focal abnormality. Bladder: Decompressed, no wall thickening. Other: No free air, free fluid, or intra-abdominal fluid collection. Musculoskeletal: There are no acute or suspicious osseous abnormalities. IMPRESSION: 1. Moderate length colonic wall thickening involving the descending colon, most consistent with mild colitis. 2. Mild splenomegaly. 3. Incidental note of horseshoe kidney configuration. Electronically Signed   By: Rubye Oaks M.D.   On: 09/28/2015 19:31   I have personally reviewed and evaluated these images and lab results as part of my medical decision-making.   EKG Interpretation None      MDM   Final diagnoses:  Colitis   Patient presents with periumbilical tenderness, worsening.  Likely enteritis, pt has HIV hx.  Plan for CT for further details as no improvement today, possible early appy.  Results and differential diagnosis were discussed with the patient/parent/guardian. Xrays were independently reviewed by myself. Sxs for < 24 hrs, will hold on abx.  CT results reviewed no abscess, colitis. Discussed follow-up with infectious disease and primary doctor.  Close follow up outpatient was discussed, comfortable with the plan.   Medications  ketorolac (TORADOL) 30 MG/ML injection 30 mg (not administered)  morphine 4 MG/ML injection 6 mg (6 mg Intravenous Given 09/28/15 1758)  sodium chloride 0.9 % bolus 1,000 mL (0 mLs Intravenous Stopped 09/28/15 1847)  iohexol (OMNIPAQUE) 300 MG/ML solution 100 mL (100 mLs Intravenous Contrast Given 09/28/15 1850)    Filed Vitals:   09/28/15 1800 09/28/15 1830 09/28/15 1841 09/28/15 1845  BP: 129/68 118/67  113/67  Pulse: 109 92 97   Temp:      TempSrc:      Resp:      Height:      Weight:      SpO2: 100%  97% 98%     Final diagnoses:  Colitis        Blane Ohara, MD 09/28/15 [phone]

## 2015-09-28 NOTE — ED Notes (Signed)
Pt. Left with all belongings and refused wheelchair. Discharge instructions were reviewed and all questions were answered.  

## 2015-10-07 ENCOUNTER — Encounter (HOSPITAL_COMMUNITY): Payer: Self-pay | Admitting: Vascular Surgery

## 2015-10-07 ENCOUNTER — Emergency Department (HOSPITAL_COMMUNITY): Payer: Medicaid Other

## 2015-10-07 ENCOUNTER — Emergency Department (HOSPITAL_COMMUNITY)
Admission: EM | Admit: 2015-10-07 | Discharge: 2015-10-07 | Disposition: A | Discharge status: Home / Self Care | Payer: Medicaid Other | Attending: Emergency Medicine | Admitting: Emergency Medicine

## 2015-10-07 DIAGNOSIS — F17210 Nicotine dependence, cigarettes, uncomplicated: Secondary | ICD-10-CM | POA: Diagnosis not present

## 2015-10-07 DIAGNOSIS — L0231 Cutaneous abscess of buttock: Secondary | ICD-10-CM | POA: Diagnosis not present

## 2015-10-07 DIAGNOSIS — Z8659 Personal history of other mental and behavioral disorders: Secondary | ICD-10-CM | POA: Insufficient documentation

## 2015-10-07 DIAGNOSIS — L0291 Cutaneous abscess, unspecified: Secondary | ICD-10-CM

## 2015-10-07 DIAGNOSIS — Z79899 Other long term (current) drug therapy: Secondary | ICD-10-CM | POA: Diagnosis not present

## 2015-10-07 DIAGNOSIS — Z21 Asymptomatic human immunodeficiency virus [HIV] infection status: Secondary | ICD-10-CM | POA: Insufficient documentation

## 2015-10-07 LAB — BASIC METABOLIC PANEL
ANION GAP: 10 (ref 5–15)
BUN: 8 mg/dL (ref 6–20)
CHLORIDE: 104 mmol/L (ref 101–111)
CO2: 26 mmol/L (ref 22–32)
Calcium: 9.2 mg/dL (ref 8.9–10.3)
Creatinine, Ser: 0.81 mg/dL (ref 0.61–1.24)
GFR calc Af Amer: >60 mL/min (ref >60)
GLUCOSE: 84 mg/dL (ref 65–99)
POTASSIUM: 3.8 mmol/L (ref 3.5–5.1)
SODIUM: 140 mmol/L (ref 135–145)

## 2015-10-07 LAB — CBC WITH DIFFERENTIAL/PLATELET
BASOS ABS: 0 10*3/uL (ref 0.0–0.1)
Basophils Relative: 0 %
EOS PCT: 1 %
Eosinophils Absolute: 0.2 10*3/uL (ref 0.0–0.7)
HCT: 39.9 % (ref 39.0–52.0)
HEMOGLOBIN: 12.8 g/dL — AB (ref 13.0–17.0)
LYMPHS ABS: 2.1 10*3/uL (ref 0.7–4.0)
LYMPHS PCT: 14 %
MCH: 26.2 pg (ref 26.0–34.0)
MCHC: 32.1 g/dL (ref 30.0–36.0)
MCV: 81.8 fL (ref 78.0–100.0)
Monocytes Absolute: 0.9 10*3/uL (ref 0.1–1.0)
Monocytes Relative: 6 %
NEUTROS PCT: 79 %
Neutro Abs: 11.7 10*3/uL — ABNORMAL HIGH (ref 1.7–7.7)
PLATELETS: 301 10*3/uL (ref 150–400)
RBC: 4.88 MIL/uL (ref 4.22–5.81)
RDW: 12.8 % (ref 11.5–15.5)
WBC: 14.8 10*3/uL — AB (ref 4.0–10.5)

## 2015-10-07 LAB — I-STAT CG4 LACTIC ACID, ED: LACTIC ACID, VENOUS: 1.06 mmol/L (ref 0.5–2.0)

## 2015-10-07 MED ORDER — CEPHALEXIN 500 MG PO CAPS: 500.0000 mg | ORAL_CAPSULE | Freq: Four times a day (QID) | ORAL | Status: DC

## 2015-10-07 MED ORDER — LIDOCAINE-EPINEPHRINE (PF) 2 %-1:200000 IJ SOLN
10.0000 mL | Freq: Once | INTRAMUSCULAR | Status: AC
  Administered 2015-10-07: 10 mL
  Filled 2015-10-07: qty 20

## 2015-10-07 MED ORDER — HYDROCODONE-ACETAMINOPHEN 5-325 MG PO TABS: 1.0000 | ORAL_TABLET | Freq: Four times a day (QID) | ORAL | Status: DC | PRN

## 2015-10-07 MED ORDER — SULFAMETHOXAZOLE-TRIMETHOPRIM 800-160 MG PO TABS: 1.0000 | ORAL_TABLET | Freq: Two times a day (BID) | ORAL | Status: AC

## 2015-10-07 MED ORDER — MORPHINE SULFATE (PF) 4 MG/ML IV SOLN
4.0000 mg | Freq: Once | INTRAVENOUS | Status: AC
Start: 2015-10-07 — End: 2015-10-07
  Administered 2015-10-07: 4 mg via INTRAVENOUS
  Filled 2015-10-07: qty 1

## 2015-10-07 MED ORDER — IOHEXOL 300 MG/ML  SOLN
100.0000 mL | Freq: Once | INTRAMUSCULAR | Status: AC | PRN
  Administered 2015-10-07: 100 mL via INTRAVENOUS

## 2015-10-07 MED ORDER — SODIUM CHLORIDE 0.9 % IV BOLUS (SEPSIS)
1000.0000 mL | Freq: Once | INTRAVENOUS | Status: AC
  Administered 2015-10-07: 1000 mL via INTRAVENOUS

## 2015-10-07 NOTE — ED Notes (Signed)
Pt reports to the ED for eval of abscess to left buttocks. Pt reports he first noticed the pain yesterday. States its has not come to a head. He denies any home treatments. Pt has hx of same. Pt A&Ox4, resp e/u, and skin warm and dry

## 2015-10-07 NOTE — ED Provider Notes (Signed)
CSN: [id number]     Arrival date & time 10/07/15  1817 History  By signing my name below, I, Julian Sanchez, attest that this documentation has been prepared under the direction and in the presence of Roxy Horseman, PA-C Electronically Signed: Jarvis Sanchez, ED Scribe. 10/07/2015. 8:53 PM.    Chief Complaint  Patient presents with  . Abscess   The history is provided by the patient. No language interpreter was used.    Calhoun R Bastos is a 19 y.o. male with a h/o HIV who presents to the Emergency Department with a chief complaint of a red, raised, painful, moderately sized, bump to his left buttock that began yesterday. Pt reports he has a h/o abscesses in the past. He denies any drainage from the area. Pt states reports the pain is exacerbated with applied pressure to bump. He denies any home treatments or medication use prior to arrival. He denies any recent fevers, chills, nausea, vomiting or other associated symptoms.   Past Medical History  Diagnosis Date  . Wisdom teeth extracted   . No pertinent past medical history   . HIV disease (HCC)   . ADHD (attention deficit hyperactivity disorder)    Past Surgical History  Procedure Laterality Date  . Mouth surgery    . Irrigation and debridement abscess  09/28/2012    Procedure: IRRIGATION AND DEBRIDEMENT ABSCESS;  Surgeon: Flo Shanks, MD;  Location: [facility] OR;  Service: ENT;  Laterality: Right;  Right Peritonsillar Abscess   Family History  Problem Relation Age of Onset  . Diabetes Mother    Social History  Substance Use Topics  . Smoking status: Light Tobacco Smoker -- 0.40 packs/day    Types: Cigarettes  . Smokeless tobacco: Never Used  . Alcohol Use: No    Review of Systems  Constitutional: Negative for fever and chills.  Gastrointestinal: Negative for nausea and vomiting.  Skin: Positive for color change.       Positive for abscess to left buttock      Allergies  Review of patient's allergies indicates no known  allergies.  Home Medications   Prior to Admission medications   Medication Sig Start Date End Date Taking? Authorizing Provider  dolutegravir (TIVICAY) 50 MG tablet Take 1 tablet (50 mg total) by mouth daily. 08/28/15   Cliffton Asters, MD  emtricitabine-tenofovir AF (DESCOVY) 200-25 MG tablet Take 1 tablet by mouth daily. 08/28/15   Cliffton Asters, MD   Triage Vitals: BP 122/73 mmHg  Pulse 98  Temp(Src) 98.3 F (36.8 C) (Oral)  Resp 16  SpO2 98%  Physical Exam  Physical Exam  Constitutional: Pt is oriented to person, place, and time. Pt appears well-developed and well-nourished. No distress.  HENT:  Head: Normocephalic and atraumatic.  Eyes: Conjunctivae are normal. No scleral icterus.  Neck: Normal range of motion.  Cardiovascular: Normal rate, regular rhythm and intact distal pulses.   Pulmonary/Chest: Effort normal and breath sounds normal.  Abdominal: Soft. Pt exhibits no distension. There is no tenderness.  Lymphadenopathy:    Pt has no cervical adenopathy.  Neurological: Pt is alert and oriented to person, place, and time.  Skin: Skin is warm and dry. Pt is not diaphoretic. There is erythema.  Large golf ball sized abscess on left lower buttock extending toward rectum Psychiatric: Pt has a normal mood and affect.  Nursing note and vitals reviewed.   ED Course  Procedures (including critical care time)  DIAGNOSTIC STUDIES: Oxygen Saturation is 98% on RA, normal by my interpretation.  COORDINATION OF CARE: 6:41 PM- Will order CT pelvis w/o contrast along with CBC w/diff, BMP and I-stat lactic acid. Pt advised of plan for treatment and pt agrees.  Labs Review Labs Reviewed  CBC WITH DIFFERENTIAL/PLATELET - Abnormal; Notable for the following:    WBC 14.8 (*)    Hemoglobin 12.8 (*)    Neutro Abs 11.7 (*)    All other components within normal limits  BASIC METABOLIC PANEL  I-STAT CG4 LACTIC ACID, ED    Imaging Review Ct Pelvis W Contrast  10/07/2015  CLINICAL  DATA:  19 year old male with left buttock abscess EXAM: CT PELVIS WITH CONTRAST TECHNIQUE: Multidetector CT imaging of the pelvis was performed using the standard protocol following the bolus administration of intravenous contrast. CONTRAST:  100 mL Omnipaque 300 COMPARISON:  Prior CT abdomen/pelvis 11/28/2014 FINDINGS: 3.2 x 2.6 cm low-attenuation collection with a thick peripheral enhancing rim in the superficial subcutaneous fat of the left medial gluteal superficial fat in the region of the perineum. No extension into the ischiorectal fossa or along the internal portion of the anus. The visualized intra-abdominal contents are unremarkable. No acute fracture or aggressive appearing lytic or blastic osseous lesion. IMPRESSION: Uncomplicated 3.2 x 2.6 cm left perineal abscess. Electronically Signed   By: Malachy MoanHeath  McCullough M.D.   On: 10/07/2015 20:46   I have personally reviewed and evaluated these images and lab results as part of my medical decision-making.   INCISION AND DRAINAGE Performed by: Roxy HorsemanBROWNING, Joselynne Killam Consent: Verbal consent obtained. Risks and benefits: risks, benefits and alternatives were discussed Type: abscess  Body area: left buttock  Anesthesia: local infiltration  Incision was made with a scalpel.  Local anesthetic: lidocaine 2% with epinephrine  Anesthetic total: 5 ml  Complexity: complex Blunt dissection to break up loculations  Drainage: purulent  Drainage amount: copious  Packing material: not packed  Patient tolerance: Patient tolerated the procedure well with no immediate complications.    MDM   Final diagnoses:  Abscess    Patient with skin abscess amenable to incision and drainage.  Wound recheck in 2 days. Encouraged home warm soaks and flushing.  Mild signs of cellulitis is surrounding skin.  Will d/c to home.   I personally performed the services described in this documentation, which was scribed in my presence. The recorded information has  been reviewed and is accurate.      Roxy Horsemanobert Fedora Knisely, PA-C 10/07/15 [id number]  Derwood KaplanAnkit Nanavati, MD 10/08/15 [phone]

## 2015-10-07 NOTE — ED Notes (Signed)
Pt gone to CT 

## 2015-10-07 NOTE — Discharge Instructions (Signed)

## 2015-10-09 MED ORDER — IOHEXOL 300 MG/ML  SOLN
100.0000 mL | Freq: Once | INTRAMUSCULAR | Status: AC | PRN
  Administered 2015-10-07: 100 mL via INTRAVENOUS

## 2015-10-15 ENCOUNTER — Other Ambulatory Visit: Payer: Medicaid Other

## 2015-10-15 DIAGNOSIS — B20 Human immunodeficiency virus [HIV] disease: Secondary | ICD-10-CM

## 2015-10-16 LAB — T-HELPER CELL (CD4) - (RCID CLINIC ONLY)
CD4 % Helper T Cell: 27 % — ABNORMAL LOW (ref 33–55)
CD4 T CELL ABS: 710 /uL (ref 400–2700)

## 2015-10-18 LAB — HIV-1 RNA QUANT-NO REFLEX-BLD
HIV 1 RNA QUANT: 20 {copies}/mL (ref <20)
HIV-1 RNA Quant, Log: 1.3 Log copies/mL (ref <1.30)

## 2015-10-28 ENCOUNTER — Ambulatory Visit: Payer: Medicaid Other | Admitting: Internal Medicine

## 2015-11-04 ENCOUNTER — Ambulatory Visit: Payer: Medicaid Other | Admitting: Internal Medicine

## 2015-11-19 ENCOUNTER — Telehealth: Payer: Self-pay | Admitting: *Deleted

## 2015-11-19 ENCOUNTER — Ambulatory Visit: Payer: Medicaid Other | Admitting: Internal Medicine

## 2015-11-19 NOTE — Telephone Encounter (Signed)
Left message requesting the pt call for a new appt with Dr. Orvan Falconer.

## 2015-12-31 ENCOUNTER — Ambulatory Visit: Payer: Medicaid Other | Admitting: Internal Medicine

## 2016-01-22 ENCOUNTER — Emergency Department (HOSPITAL_COMMUNITY)
Admission: EM | Admit: 2016-01-22 | Discharge: 2016-01-23 | Disposition: A | Discharge status: Home / Self Care | Payer: Medicaid Other | Attending: Emergency Medicine | Admitting: Emergency Medicine

## 2016-01-22 ENCOUNTER — Encounter (HOSPITAL_COMMUNITY): Payer: Self-pay | Admitting: Emergency Medicine

## 2016-01-22 DIAGNOSIS — Z8719 Personal history of other diseases of the digestive system: Secondary | ICD-10-CM | POA: Diagnosis not present

## 2016-01-22 DIAGNOSIS — Z8659 Personal history of other mental and behavioral disorders: Secondary | ICD-10-CM | POA: Diagnosis not present

## 2016-01-22 DIAGNOSIS — Z21 Asymptomatic human immunodeficiency virus [HIV] infection status: Secondary | ICD-10-CM | POA: Diagnosis not present

## 2016-01-22 DIAGNOSIS — Z79899 Other long term (current) drug therapy: Secondary | ICD-10-CM | POA: Diagnosis not present

## 2016-01-22 DIAGNOSIS — F17210 Nicotine dependence, cigarettes, uncomplicated: Secondary | ICD-10-CM | POA: Diagnosis not present

## 2016-01-22 DIAGNOSIS — L0231 Cutaneous abscess of buttock: Secondary | ICD-10-CM | POA: Diagnosis present

## 2016-01-22 DIAGNOSIS — L03317 Cellulitis of buttock: Secondary | ICD-10-CM | POA: Diagnosis not present

## 2016-01-22 DIAGNOSIS — Z792 Long term (current) use of antibiotics: Secondary | ICD-10-CM | POA: Diagnosis not present

## 2016-01-22 NOTE — ED Provider Notes (Signed)
CSN: [id number]     Arrival date & time 01/22/16  2216 History  By signing my name below, I, Julian Sanchez, attest that this documentation has been prepared under the direction and in the presence of Bear StearnsKayla Elijah Phommachanh, PA-C. Electronically Signed: Evon Slackerrance Sanchez, ED Scribe. 01/22/2016. 12:00 AM.     Chief Complaint  Patient presents with  . Abscess    The patient says he has a boil on his "butt".  He advised me he has had them before and was seen here the last time he had one.  He said it started three days ago and he says he tried to do hot compresses and they did not work.     The history is provided by the patient. No language interpreter was used.   HPI Comments: Arlyss RepressDevonte R Sanchez is a 20 y.o. male with PMH significant for HIV who presents to the Emergency Department complaining of gradual onset, constant, worsening abscess to right buttock 3 days prior. Pt does report intermittent chills. Pt states he has has a Hx of abscesses in same area. Pt states that he has tried warm compress with no relief. He states that standing and sitting on his buttocks makes the pain worse. Pt denies any medications PTA.  Pt denies drainage, fever, nausea or vomiting.   Past Medical History  Diagnosis Date  . Wisdom teeth extracted   . No pertinent past medical history   . HIV disease (HCC)   . ADHD (attention deficit hyperactivity disorder)    Past Surgical History  Procedure Laterality Date  . Mouth surgery    . Irrigation and debridement abscess  09/28/2012    Procedure: IRRIGATION AND DEBRIDEMENT ABSCESS;  Surgeon: Flo ShanksKarol Wolicki, MD;  [facility];  Service: ENT;  Laterality: Right;  Right Peritonsillar Abscess   Family History  Problem Relation Age of Onset  . Diabetes Mother    Social History  Substance Use Topics  . Smoking status: Light Tobacco Smoker -- 0.40 packs/day    Types: Cigarettes  . Smokeless tobacco: Never Used  . Alcohol Use: No    Review of Systems  Constitutional: Positive  for chills. Negative for fever.  Gastrointestinal: Negative for nausea and vomiting.  All other systems reviewed and are negative.    Allergies  Review of patient's allergies indicates no known allergies.  Home Medications   Prior to Admission medications   Medication Sig Start Date End Date Taking? Authorizing Provider  cephALEXin (KEFLEX) 500 MG capsule Take 1 capsule (500 mg total) by mouth 4 (four) times daily. 10/07/15   Roxy Horsemanobert Browning, PA-C  cetirizine (ZYRTEC) 10 MG tablet Take 1 tablet (10 mg total) by mouth daily. 01/23/16   Blythe Veach, PA-C  dolutegravir (TIVICAY) 50 MG tablet Take 1 tablet (50 mg total) by mouth daily. 08/28/15   Cliffton AstersJohn Campbell, MD  emtricitabine-tenofovir AF (DESCOVY) 200-25 MG tablet Take 1 tablet by mouth daily. 08/28/15   Cliffton AstersJohn Campbell, MD  HYDROcodone-acetaminophen (NORCO/VICODIN) 5-325 MG tablet Take 1-2 tablets by mouth every 6 (six) hours as needed. 10/07/15   Roxy Horsemanobert Browning, PA-C   BP 123/80 mmHg  Pulse 102  Temp(Src) 99.3 F (37.4 C) (Oral)  Resp 16  Ht 6' (1.829 m)  Wt 66.225 kg  BMI 19.80 kg/m2  SpO2 98%   Physical Exam  Constitutional: He is oriented to person, place, and time. He appears well-developed and well-nourished.  Non-toxic appearance. He does not have a sickly appearance. He does not appear ill.  HENT:  Head: Normocephalic and atraumatic.  Right Ear: External ear normal.  Left Ear: External ear normal.  Mouth/Throat: Oropharynx is clear and moist.  Eyes: Conjunctivae are normal. Pupils are equal, round, and reactive to light. No scleral icterus.  Neck: Normal range of motion. Neck supple. No tracheal deviation present.  Cardiovascular: Normal rate, regular rhythm and normal heart sounds.   No murmur heard. Pulmonary/Chest: Effort normal and breath sounds normal. No accessory muscle usage or stridor. No respiratory distress. He has no wheezes. He has no rhonchi. He has no rales.  Abdominal: Soft. Bowel sounds are normal. He  exhibits no distension. There is no tenderness.  Musculoskeletal: Normal range of motion.  Lymphadenopathy:    He has no cervical adenopathy.  Neurological: He is alert and oriented to person, place, and time.  Speech clear without dysarthria.  Skin: Skin is warm and dry. There is erythema.     5 cm area of erythema, warmth, and induration on the right buttock. No fluctuance. No active drainage.  Psychiatric: He has a normal mood and affect. His behavior is normal.  Nursing note and vitals reviewed.   ED Course  Procedures (including critical care time) DIAGNOSTIC STUDIES: Oxygen Saturation is 98% on RA, normal by my interpretation.    COORDINATION OF CARE: 11:55 PM-Discussed treatment plan with pt at bedside and pt agreed to plan.     Labs Review Labs Reviewed - No data to display  Imaging Review No results found.    EKG Interpretation None      MDM   Final diagnoses:  Cellulitis of buttock   Patient presents with cellulitis of the right buttock. No area of fluctuance. I&D is not indicated at this time. VSS, NAD. Mild tachycardia with heart rate 102, I suspect this is related to pain. He is afebrile. He appears well, nontoxic, or septic appearing. On exam, heart sounds normal, lungs CTAP, abdomen soft and benign. 5 cm area of erythema, warmth, and induration on the right buttock. Patient given ibuprofen in ED. Plan to discharge home with Norco, Bactrim, and Keflex. Patient advised to follow-up in 2 days if no improvement for possible I&D or IV antibiotics. Discussed return precautions. Patient agrees and acknowledges the above plan for discharge.  I personally performed the services described in this documentation, which was scribed in my presence. The recorded information has been reviewed and is accurate.      Cheri Fowler, PA-C 01/23/16 0025  Tomasita Crumble, MD 01/23/16 [phone]

## 2016-01-22 NOTE — ED Notes (Signed)
The patient says he has a boil on his "butt".  He advised me he has had them before and was seen here the last time he had one.  He said it started three days ago and he says he tried to do hot compresses and they did not work.    He said he has not taken anything for the pain but rates it 10/10 if he sits on it. The abscess is on the right buttocks.

## 2016-01-23 MED ORDER — CEPHALEXIN 500 MG PO CAPS: 500.0000 mg | ORAL_CAPSULE | Freq: Four times a day (QID) | ORAL | Status: DC

## 2016-01-23 MED ORDER — CETIRIZINE HCL 10 MG PO TABS: 10.0000 mg | ORAL_TABLET | Freq: Every day | ORAL | Status: DC

## 2016-01-23 MED ORDER — SULFAMETHOXAZOLE-TRIMETHOPRIM 800-160 MG PO TABS: 1.0000 | ORAL_TABLET | Freq: Two times a day (BID) | ORAL | Status: AC

## 2016-01-23 MED ORDER — IBUPROFEN 400 MG PO TABS
800.0000 mg | ORAL_TABLET | Freq: Once | ORAL | Status: AC
  Administered 2016-01-23: 800 mg via ORAL
  Filled 2016-01-23: qty 2

## 2016-01-23 MED ORDER — HYDROCODONE-ACETAMINOPHEN 5-325 MG PO TABS: 1.0000 | ORAL_TABLET | ORAL | Status: DC | PRN

## 2016-01-23 NOTE — Discharge Instructions (Signed)

## 2016-03-31 ENCOUNTER — Ambulatory Visit: Payer: Medicaid Other | Admitting: Internal Medicine

## 2016-10-30 IMAGING — CT CT PELVIS W/ CM
2 of 3 series · 18 of 46 positions shown, 20 images · IV contrast (omnipaque)
Comparison: Prior CT abdomen/pelvis 11/28/2014

CLINICAL DATA: 19-year-old male with left buttock abscess

EXAM:
CT PELVIS WITH CONTRAST
TECHNIQUE: Multidetector CT imaging of the pelvis was performed using the
standard protocol following the bolus administration of intravenous
contrast.
CONTRAST:  100 mL Omnipaque 300

[Series 3: pelvis 5.0 i31f 1 · axial · 0.65mm/px · z∈[+806,+1091]mm · 15 of 65 slices shown, 17 images]
[im 5/65  soft-tissue]
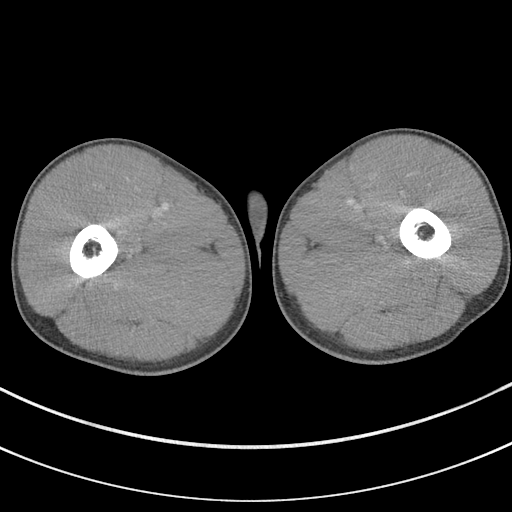
[im 5/65  bone]
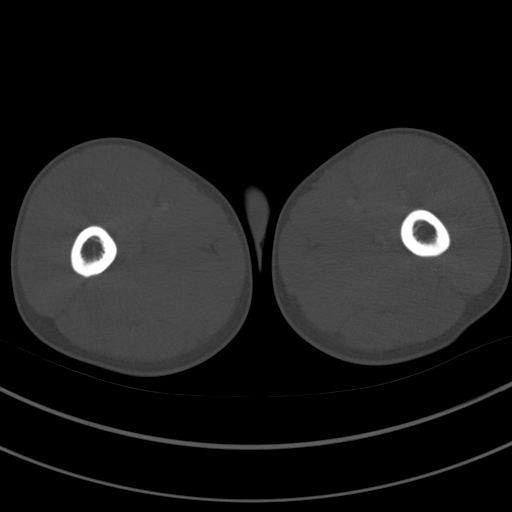
[im 9/65  soft-tissue]
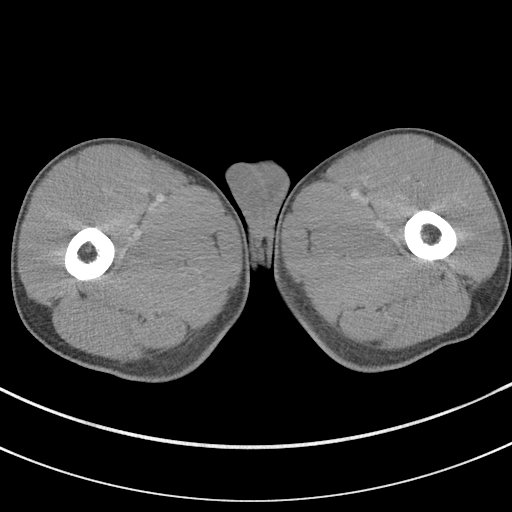
[im 13/65  soft-tissue]
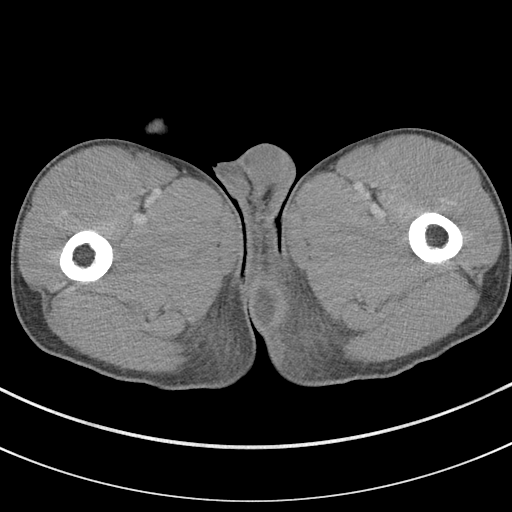
[im 17/65  soft-tissue]
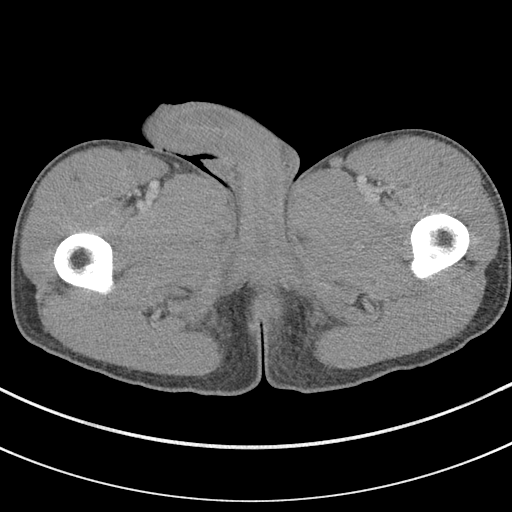
[im 21/65  soft-tissue]
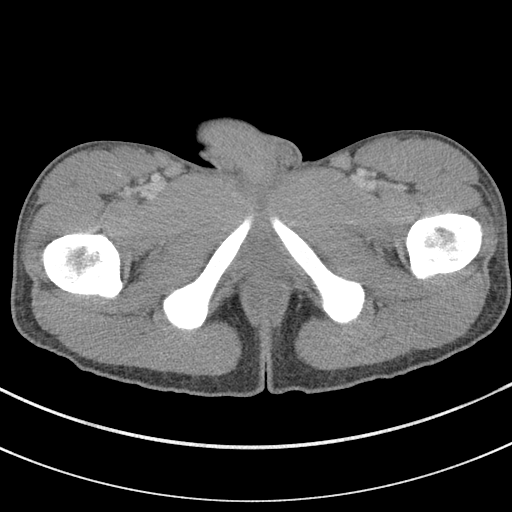
[im 25/65  soft-tissue]
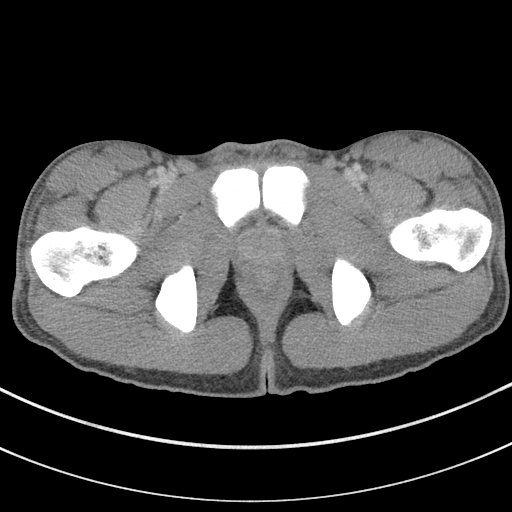
[im 29/65  soft-tissue]
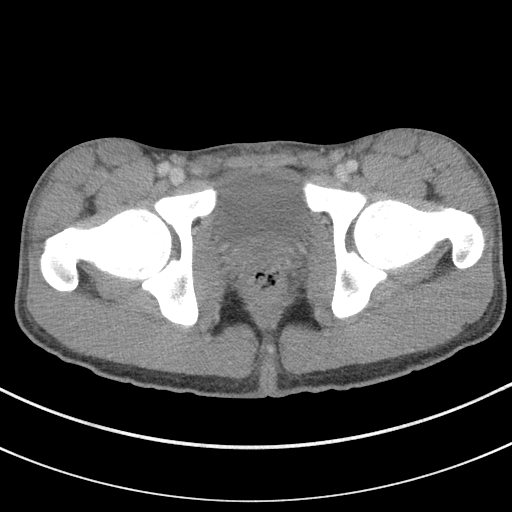
[im 34/65  soft-tissue]
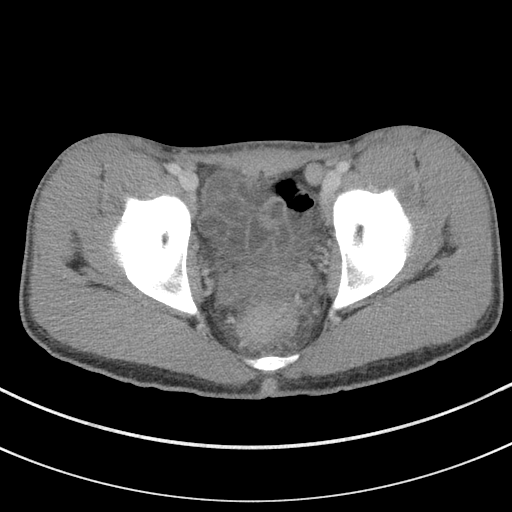
[im 38/65  soft-tissue]
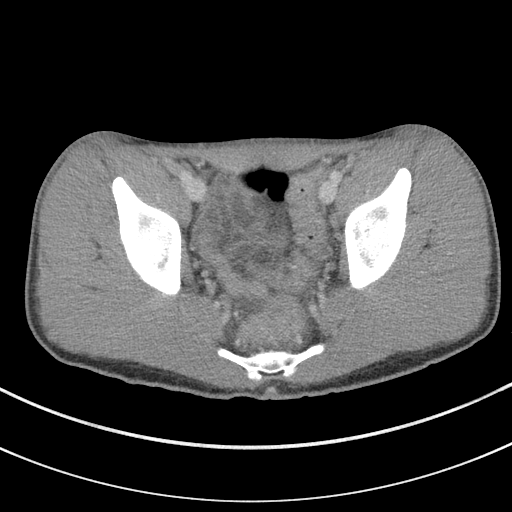
[im 38/65  bone]
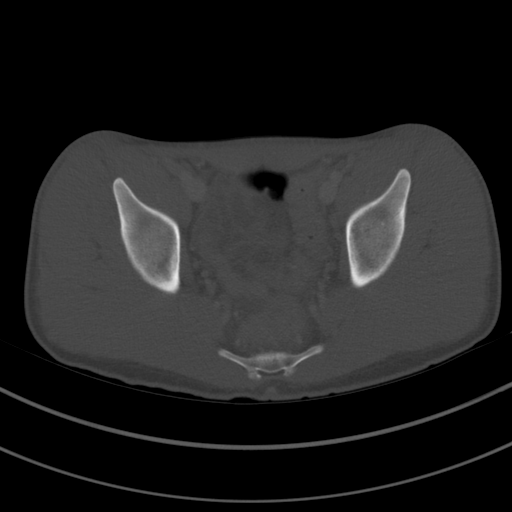
[im 42/65  soft-tissue]
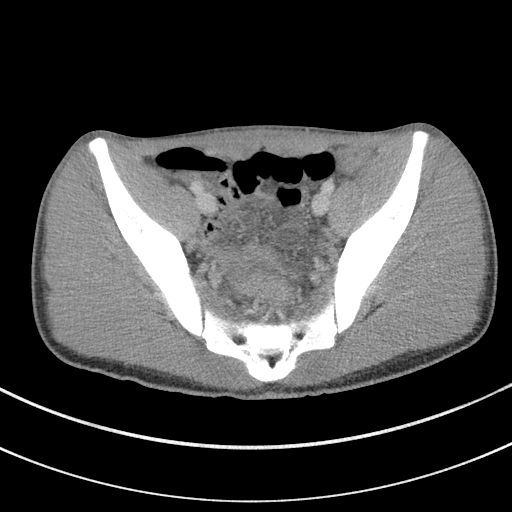
[im 46/65  soft-tissue]
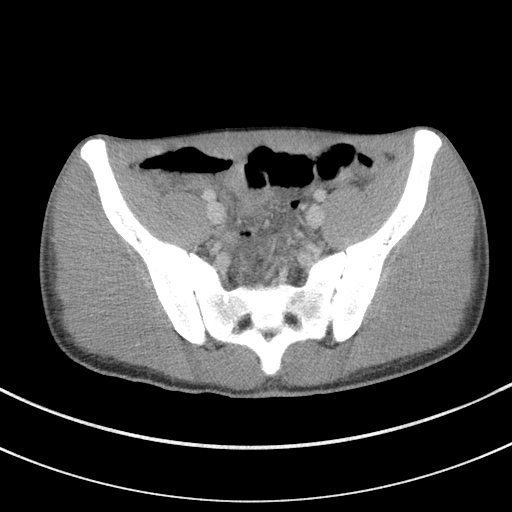
[im 50/65  soft-tissue]
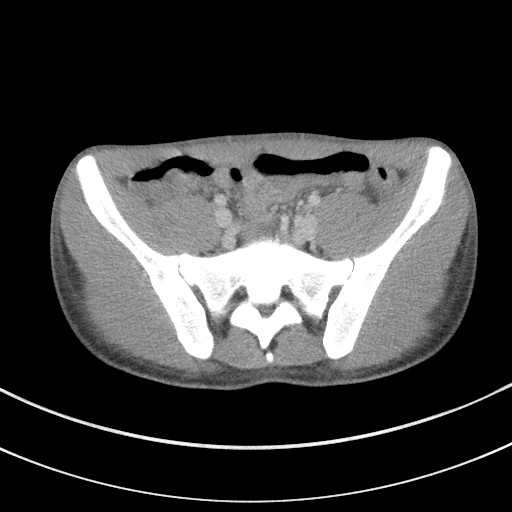
[im 54/65  soft-tissue]
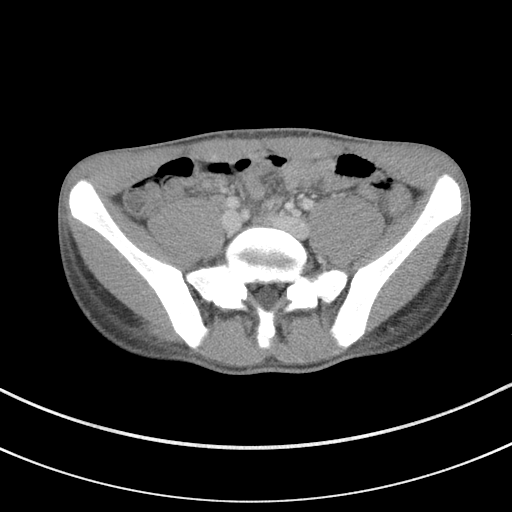
[im 58/65  soft-tissue]
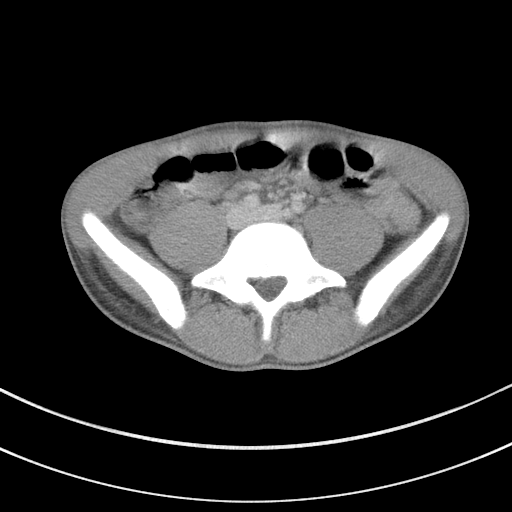
[im 62/65  soft-tissue]
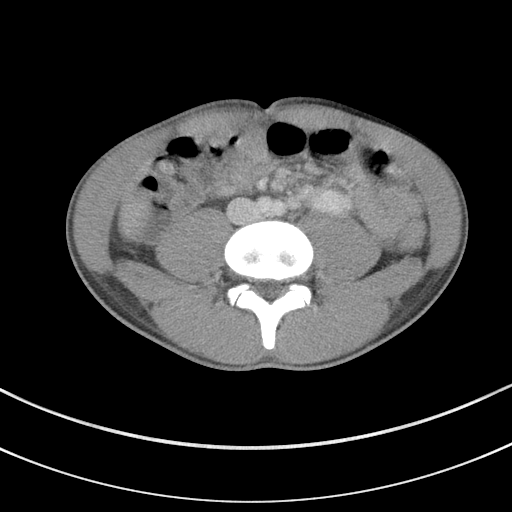

[Series 6: coronal · coronal · 0.62mm/px · 3 of 62 slices shown]
[im 21/62  soft-tissue]
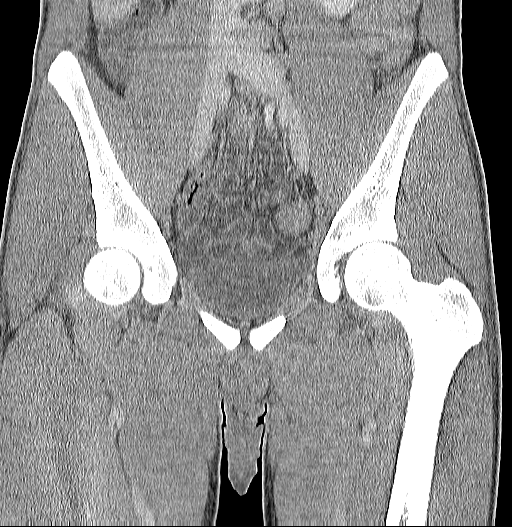
[im 28/62  soft-tissue]
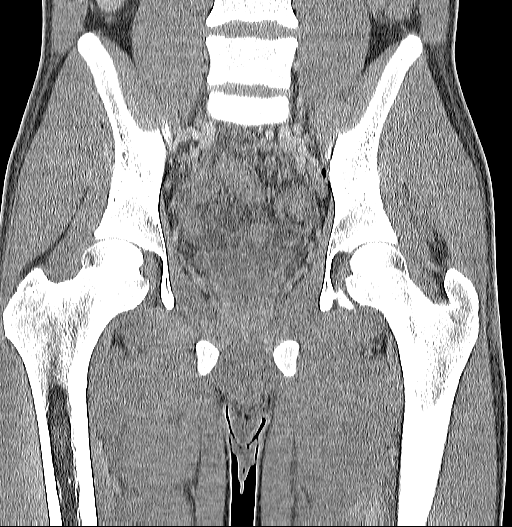
[im 34/62  soft-tissue]
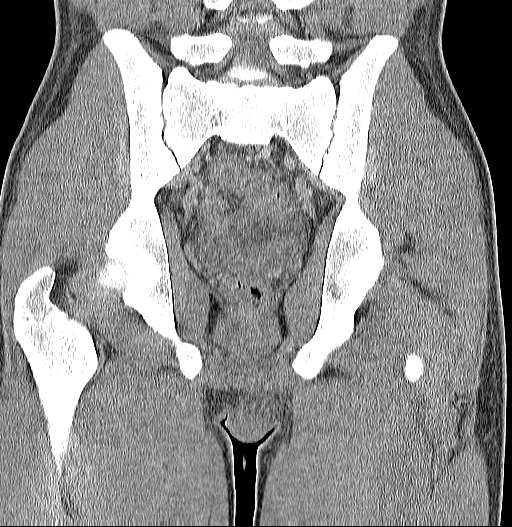

[18 of 46 positions shown; findings below may reference images not displayed]

FINDINGS: 3.2 x 2.6 cm low-attenuation collection with a thick peripheral
enhancing rim in the superficial subcutaneous fat of the left medial
gluteal superficial fat in the region of the perineum. No extension
into the ischiorectal fossa or along the internal portion of the
anus. The visualized intra-abdominal contents are unremarkable. No
acute fracture or aggressive appearing lytic or blastic osseous
lesion.
IMPRESSION: Uncomplicated 3.2 x 2.6 cm left perineal abscess.

## 2017-01-20 ENCOUNTER — Other Ambulatory Visit: Payer: Self-pay

## 2017-01-20 ENCOUNTER — Other Ambulatory Visit (HOSPITAL_COMMUNITY): Admission: RE | Admit: 2017-01-20 | Payer: Medicaid Other | Source: Ambulatory Visit

## 2017-01-20 DIAGNOSIS — Z113 Encounter for screening for infections with a predominantly sexual mode of transmission: Secondary | ICD-10-CM

## 2017-01-20 DIAGNOSIS — Z79899 Other long term (current) drug therapy: Secondary | ICD-10-CM

## 2017-01-20 DIAGNOSIS — B20 Human immunodeficiency virus [HIV] disease: Secondary | ICD-10-CM

## 2017-01-20 LAB — CBC WITH DIFFERENTIAL/PLATELET
Basophils Absolute: 0 cells/uL (ref 0–200)
Basophils Relative: 0 %
Eosinophils Absolute: 94 cells/uL (ref 15–500)
Eosinophils Relative: 1 %
HCT: 43.2 % (ref 38.5–50.0)
Hemoglobin: 14 g/dL (ref 13.2–17.1)
Lymphocytes Relative: 20 %
Lymphs Abs: 1880 cells/uL (ref 850–3900)
MCH: 25.8 pg — ABNORMAL LOW (ref 27.0–33.0)
MCHC: 32.4 g/dL (ref 32.0–36.0)
MCV: 79.7 fL — ABNORMAL LOW (ref 80.0–100.0)
MPV: 10.1 fL (ref 7.5–12.5)
Monocytes Absolute: 564 cells/uL (ref 200–950)
Monocytes Relative: 6 %
Neutro Abs: 6862 cells/uL (ref 1500–7800)
Neutrophils Relative %: 73 %
Platelets: 226 10*3/uL (ref 140–400)
RBC: 5.42 MIL/uL (ref 4.20–5.80)
RDW: 13.1 % (ref 11.0–15.0)
WBC: 9.4 10*3/uL (ref 3.8–10.8)

## 2017-01-20 LAB — LIPID PANEL
CHOL/HDL RATIO: 3.4 ratio (ref <5.0)
Cholesterol: 112 mg/dL (ref <200)
HDL: 33 mg/dL — AB (ref >40)
LDL CALC: 68 mg/dL (ref <100)
Triglycerides: 56 mg/dL (ref <150)
VLDL: 11 mg/dL (ref <30)

## 2017-01-20 LAB — COMPREHENSIVE METABOLIC PANEL
ALT: 8 U/L — ABNORMAL LOW (ref 9–46)
AST: 17 U/L (ref 10–40)
Albumin: 4.3 g/dL (ref 3.6–5.1)
Alkaline Phosphatase: 62 U/L (ref 40–115)
BUN: 10 mg/dL (ref 7–25)
CO2: 21 mmol/L (ref 20–31)
Calcium: 9.9 mg/dL (ref 8.6–10.3)
Chloride: 102 mmol/L (ref 98–110)
Creat: 0.98 mg/dL (ref 0.60–1.35)
Glucose, Bld: 91 mg/dL (ref 65–99)
Potassium: 3.9 mmol/L (ref 3.5–5.3)
Sodium: 140 mmol/L (ref 135–146)
Total Bilirubin: 0.5 mg/dL (ref 0.2–1.2)
Total Protein: 8.7 g/dL — ABNORMAL HIGH (ref 6.1–8.1)

## 2017-01-21 LAB — URINE CYTOLOGY ANCILLARY ONLY
Chlamydia: NEGATIVE
Neisseria Gonorrhea: NEGATIVE

## 2017-01-21 LAB — FLUORESCENT TREPONEMAL AB(FTA)-IGG-BLD: Fluorescent Treponemal ABS: REACTIVE — AB

## 2017-01-21 LAB — T-HELPER CELL (CD4) - (RCID CLINIC ONLY)
CD4 % Helper T Cell: 25 % — ABNORMAL LOW (ref 33–55)
CD4 T CELL ABS: 490 /uL (ref 400–2700)

## 2017-01-22 LAB — HIV-1 RNA QUANT-NO REFLEX-BLD
HIV 1 RNA Quant: 172 copies/mL — ABNORMAL HIGH
HIV-1 RNA Quant, Log: 2.24 Log copies/mL — ABNORMAL HIGH

## 2017-01-23 LAB — RPR: RPR: REACTIVE — AB

## 2017-01-23 LAB — RPR TITER

## 2017-01-25 ENCOUNTER — Telehealth: Payer: Self-pay | Admitting: *Deleted

## 2017-01-25 NOTE — Telephone Encounter (Signed)
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

## 2017-01-25 NOTE — Telephone Encounter (Signed)
Abnormal lab work requiring treatment per Dr. Orvan Falconer.  Unable to contact patient at telephone numbers available.  MD appt scheduled for 02/02/17.  RN spoke with [facility] pharmacist.  Patient has not refilled HIV rxes since 02/18/2016.  RN will forward this message to Dr. Orvan Falconer.

## 2017-01-26 NOTE — Telephone Encounter (Signed)
Hopefully he will show up for his scheduled visit on 02/02/2017.

## 2017-02-02 ENCOUNTER — Encounter: Payer: Self-pay | Admitting: Internal Medicine

## 2017-02-02 ENCOUNTER — Ambulatory Visit (INDEPENDENT_AMBULATORY_CARE_PROVIDER_SITE_OTHER): Payer: Self-pay | Admitting: Internal Medicine

## 2017-02-02 VITALS — BP 108/66 | HR 75 | Temp 98.0°F | Ht 72.0 in | Wt 140.0 lb

## 2017-02-02 DIAGNOSIS — F17210 Nicotine dependence, cigarettes, uncomplicated: Secondary | ICD-10-CM

## 2017-02-02 DIAGNOSIS — Z5900 Homelessness unspecified: Secondary | ICD-10-CM | POA: Insufficient documentation

## 2017-02-02 DIAGNOSIS — F32A Depression, unspecified: Secondary | ICD-10-CM

## 2017-02-02 DIAGNOSIS — Z23 Encounter for immunization: Secondary | ICD-10-CM

## 2017-02-02 DIAGNOSIS — A528 Late syphilis, latent: Secondary | ICD-10-CM | POA: Insufficient documentation

## 2017-02-02 DIAGNOSIS — F329 Major depressive disorder, single episode, unspecified: Secondary | ICD-10-CM

## 2017-02-02 DIAGNOSIS — B20 Human immunodeficiency virus [HIV] disease: Secondary | ICD-10-CM

## 2017-02-02 MED ORDER — PENICILLIN G BENZATHINE 1200000 UNIT/2ML IM SUSP
1.2000 10*6.[IU] | Freq: Once | INTRAMUSCULAR | Status: AC
  Administered 2017-02-02: 1.2 10*6.[IU] via INTRAMUSCULAR

## 2017-02-02 NOTE — Progress Notes (Addendum)
Patient Active Problem List   Diagnosis Date Noted  . HIV disease ([place]) 08/19/2015    Priority: High  . Homelessness 02/02/2017  . 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 Depression 09/10/2015  . 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 Cigarette smoker 08/19/2015    Patient's Medications  New Prescriptions   No medications on file  Previous Medications   DOLUTEGRAVIR (TIVICAY) 50 MG TABLET    Take 1 tablet (50 mg total) by mouth daily.   EMTRICITABINE-TENOFOVIR AF (DESCOVY) 200-25 MG TABLET    Take 1 tablet by mouth daily.  Modified Medications   No medications on file  Discontinued Medications   CEPHALEXIN (KEFLEX) 500 MG CAPSULE    Take 1 capsule (500 mg total) by mouth 4 (four) times daily.   HYDROCODONE-ACETAMINOPHEN (NORCO/VICODIN) 5-325 MG TABLET    Take 1 tablet by mouth every 4 (four) hours as needed.    Subjective: Chuckie is in for his first visit since November 2016. He has struggled with homelessness and is currently living out of his car. For a while he was living with a friend in [place] and found it difficult to get back to his appointments here. He did stay on Descovy and Tivicay until about October of last year. He was taking them each evening about 8 PM. He had no side effects and was not missing doses. He recently met with his [employer] case manager, Myriam Jacobson, who told him that he was working on housing for him. He is currently working at [employer] and enjoys his work. His mother also works there. He has told his mother about his HIV infection and feels like he has good support. He has been sexually active with 3 male partners in the past year. He says that he always uses condoms. He has not disclosed his HIV status to them. He is smoking cigarettes and has no plans to quit. He states that his mother diagnosed him as bipolar because of his continued mood swings.   Review of Systems: Review of Systems  Constitutional: Negative for chills, diaphoresis, fever, malaise/fatigue  and weight loss.  HENT: Negative for sore throat.   Respiratory: Negative for cough, sputum production and shortness of breath.   Cardiovascular: Negative for chest pain.  Gastrointestinal: Negative for abdominal pain, diarrhea, heartburn, nausea and vomiting.  Genitourinary: Negative for dysuria.  Musculoskeletal: Negative for joint pain and myalgias.  Skin: Negative for rash.  Neurological: Negative for dizziness and headaches.  Psychiatric/Behavioral: Positive for depression. Negative for substance abuse. The patient is not nervous/anxious.        He still has some problems with anger management.    Past Medical History:  Diagnosis Date  . ADHD (attention deficit hyperactivity disorder)   . HIV disease ([place])   . No pertinent past medical history   . Wisdom teeth extracted     Social History  Substance Use Topics  . Smoking status: Light Tobacco Smoker    Packs/day: 0.40    Types: Cigarettes  . Smokeless tobacco: Never Used  . Alcohol use No    Family History  Problem Relation Age of Onset  . Diabetes Mother     No Known Allergies  Objective:  Vitals:   02/02/17 1042  BP: 108/66  Pulse: 75  Temp: 98 F (36.7 C)  TempSrc: Oral  Weight: 140 lb (63.5 kg)  Height: 6' (1.829 m)   Body mass index is 18.99 kg/m.  Physical Exam  Constitutional: He  is oriented to person, place, and time.  His weight is down 8 pounds.  HENT:  Mouth/Throat: No oropharyngeal exudate.  Eyes: Conjunctivae are normal.  Cardiovascular: Normal rate and regular rhythm.   No murmur heard. Pulmonary/Chest: Breath sounds normal.  Abdominal: Soft. He exhibits no mass. There is no tenderness.  Musculoskeletal: Normal range of motion.  Neurological: He is alert and oriented to person, place, and time.  Skin: No rash noted.  Psychiatric: Mood and affect normal.    Lab Results Lab Results  Component Value Date   WBC 9.4 01/20/2017   HGB 14.0 01/20/2017   HCT 43.2 01/20/2017   MCV  79.7 (L) 01/20/2017   PLT 226 01/20/2017    Lab Results  Component Value Date   CREATININE 0.98 01/20/2017   BUN 10 01/20/2017   NA 140 01/20/2017   K 3.9 01/20/2017   CL 102 01/20/2017   CO2 21 01/20/2017    Lab Results  Component Value Date   ALT 8 (L) 01/20/2017   AST 17 01/20/2017   ALKPHOS 62 01/20/2017   BILITOT 0.5 01/20/2017    Lab Results  Component Value Date   CHOL 112 01/20/2017   HDL 33 (L) 01/20/2017   LDLCALC 68 01/20/2017   TRIG 56 01/20/2017   CHOLHDL 3.4 01/20/2017   HIV 1 RNA Quant (copies/mL)  Date Value  01/20/2017 172 (H)  10/15/2015 20  08/01/2015 43,695 (H)   CD4 T Cell Abs (/uL)  Date Value  01/20/2017 490  10/15/2015 710  08/01/2015 440     Problem List Items Addressed This Visit      High   HIV disease ([place])    Fortunately his CD4 count remains normal and he's only had very low-level viral activation since stopping Descovy and Tivicay 6 months ago. We need to clarify his insurance status and try to get him back on antiretroviral therapy as soon as possible. I did talk to him about the importance of disclosing his HIV status to future partners as well as limiting the number of partners that he has. He was given a supply of condoms today. He will follow-up in 4 weeks.        Unprioritized   Cigarette smoker    I did ask him to consider cigarette cessation.      Depression    He continues to struggle with mood swings. I will set him up to see our counselor when he comes for his next visit.      Homelessness    He has been assigned to Lanelle Bal, his new case Freight forwarder with [employer]. We will try to get them together as soon as possible to work on stable housing.      Late latent syphilis    He has contracted syphilis sometime in the last year and a half. 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           Michel Bickers, MD [facility]  [facility] [phone] pager   [phone] cell 02/02/2017, 11:25 AM

## 2017-02-02 NOTE — Assessment & Plan Note (Signed)
He continues to struggle with mood swings. I will set him up to see our counselor when he comes for his next visit.

## 2017-02-02 NOTE — Assessment & Plan Note (Signed)
He has contracted syphilis sometime in the last year and a half. 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

## 2017-02-02 NOTE — Addendum Note (Signed)
Addended by: Andree Coss on: 02/02/2017 03:00 PM   Modules accepted: Orders

## 2017-02-02 NOTE — Assessment & Plan Note (Signed)
I did ask him to consider cigarette cessation. 

## 2017-02-02 NOTE — Assessment & Plan Note (Signed)
He has been assigned to Dorene Grebe, his new case Production designer, theatre/television/film with [employer]. We will try to get them together as soon as possible to work on stable housing.

## 2017-02-02 NOTE — Assessment & Plan Note (Addendum)
Fortunately his CD4 count remains normal and he's only had very low-level viral activation since stopping Descovy and Tivicay 6 months ago. We need to clarify his insurance status and try to get him back on antiretroviral therapy as soon as possible. I did talk to him about the importance of disclosing his HIV status to future partners as well as limiting the number of partners that he has. He was given a supply of condoms today. He will follow-up in 4 weeks.

## 2017-02-09 ENCOUNTER — Ambulatory Visit (INDEPENDENT_AMBULATORY_CARE_PROVIDER_SITE_OTHER): Payer: Self-pay | Admitting: *Deleted

## 2017-02-09 DIAGNOSIS — Z113 Encounter for screening for infections with a predominantly sexual mode of transmission: Secondary | ICD-10-CM

## 2017-02-09 MED ORDER — PENICILLIN G BENZATHINE 1200000 UNIT/2ML IM SUSP
1.2000 10*6.[IU] | Freq: Once | INTRAMUSCULAR | Status: AC
  Administered 2017-02-09: 1.2 10*6.[IU] via INTRAMUSCULAR

## 2017-02-16 ENCOUNTER — Ambulatory Visit (INDEPENDENT_AMBULATORY_CARE_PROVIDER_SITE_OTHER): Payer: Self-pay | Admitting: *Deleted

## 2017-02-16 DIAGNOSIS — A539 Syphilis, unspecified: Secondary | ICD-10-CM

## 2017-02-16 MED ORDER — PENICILLIN G BENZATHINE 1200000 UNIT/2ML IM SUSP
1.2000 10*6.[IU] | Freq: Once | INTRAMUSCULAR | Status: AC
  Administered 2017-02-16: 1.2 10*6.[IU] via INTRAMUSCULAR

## 2017-04-29 ENCOUNTER — Ambulatory Visit: Payer: Self-pay | Admitting: Internal Medicine

## 2017-05-19 ENCOUNTER — Other Ambulatory Visit: Payer: Self-pay | Admitting: Pharmacist Clinician (PhC)/ Clinical Pharmacy Specialist

## 2017-05-19 ENCOUNTER — Ambulatory Visit: Payer: Self-pay

## 2017-05-19 ENCOUNTER — Ambulatory Visit (INDEPENDENT_AMBULATORY_CARE_PROVIDER_SITE_OTHER): Payer: Self-pay | Admitting: Pharmacist Clinician (PhC)/ Clinical Pharmacy Specialist

## 2017-05-19 DIAGNOSIS — B20 Human immunodeficiency virus [HIV] disease: Secondary | ICD-10-CM

## 2017-05-19 MED ORDER — BICTEGRAVIR-EMTRICITAB-TENOFOV 50-200-25 MG PO TABS: 1.0000 | ORAL_TABLET | Freq: Every day | ORAL | 1 refills | Status: DC

## 2017-05-19 NOTE — Progress Notes (Signed)
HPI: Julian Sanchez is a 21 y.o. male who was recently here to see Dr. Orvan Falconerampbell after being MIA for while.   Allergies: No Known Allergies  Vitals:    Past Medical History: Past Medical History:  Diagnosis Date  . ADHD (attention deficit hyperactivity disorder)   . HIV disease (HCC)   . No pertinent past medical history   . Wisdom teeth extracted     Social History: Social History   Social History  . Marital status: Single    Spouse name: N/A  . Number of children: N/A  . Years of education: N/A   Social History Main Topics  . Smoking status: Light Tobacco Smoker    Packs/day: 0.40    Types: Cigarettes  . Smokeless tobacco: Never Used  . Alcohol use No  . Drug use: Yes    Frequency: 7.0 times per week    Types: Marijuana     Comment: started smoking "weed" as a freshman in high school, 6 years   . Sexual activity: Not Currently    Partners: Male, Male    Birth control/ protection: Condom     Comment: given condoms   Other Topics Concern  . Not on file   Social History Narrative  . No narrative on file    Previous Regimen: DTG/Descovy  Current Regimen: None  Labs: HIV 1 RNA Quant (copies/mL)  Date Value  01/20/2017 172 (H)  10/15/2015 20  08/01/2015 43,695 (H)   CD4 T Cell Abs (/uL)  Date Value  01/20/2017 490  10/15/2015 710  08/01/2015 440   Hep B S Ab (no units)  Date Value  08/01/2015 NEG   Hepatitis B Surface Ag (no units)  Date Value  08/01/2015 NEGATIVE   HCV Ab (no units)  Date Value  08/01/2015 NEGATIVE    CrCl: CrCl cannot be calculated (Patient's most recent lab result is older than the maximum 21 days allowed.).  Lipids:    Component Value Date/Time   CHOL 112 01/20/2017 1118   TRIG 56 01/20/2017 1118   HDL 33 (L) 01/20/2017 1118   CHOLHDL 3.4 01/20/2017 1118   VLDL 11 01/20/2017 1118   LDLCALC 68 01/20/2017 1118    Assessment: Olegario MessierKathy stopped me to ask if I can see Julian Sanchez today to deal with his med issue. He  recently saw Dr. Orvan Falconerampbell to re-establish care with Julian Sanchez after being out of care for about a year. He still struggles with being homeless. He has been off of ART for a while and surprisingly his VL was 172 on 4/11. Since he has no coverage right now at all and just filled out the ADAP app with Olegario MessierKathy, we'll try to get some Biktarvy for him to restart on. He has been approved for 30d supply and I'll send in the patient assitance application also. Just in case the ADAP has not been approved. [facility] and [facility]. We'll f/u with him 6 wks for labs.   Recommendations:  Dc DTG/Descovy Start Biktarvy 1 daily F/u with pharmacy in 6 wks for labs then with MD  [name], PharmD, BCPS, AAHIVP, CPP Clinical Infectious Disease Pharmacist [facility] 05/19/2017, 11:25 AM

## 2017-05-20 ENCOUNTER — Encounter: Payer: Self-pay | Admitting: Internal Medicine

## 2017-05-26 ENCOUNTER — Other Ambulatory Visit: Payer: Self-pay | Admitting: Pharmacist

## 2017-06-24 MED FILL — BIKTARVY 50-200-25 MG TABS: 50-200-25 | 30 days supply | Qty: 30 | Fill #0

## 2017-06-29 ENCOUNTER — Ambulatory Visit: Payer: Self-pay

## 2017-07-21 ENCOUNTER — Emergency Department (HOSPITAL_COMMUNITY): Payer: Self-pay

## 2017-07-21 ENCOUNTER — Emergency Department (HOSPITAL_COMMUNITY)
Admission: EM | Admit: 2017-07-21 | Discharge: 2017-07-21 | Disposition: A | Discharge status: Home / Self Care | Payer: Self-pay | Attending: Emergency Medicine | Admitting: Emergency Medicine

## 2017-07-21 ENCOUNTER — Encounter (HOSPITAL_COMMUNITY): Payer: Self-pay | Admitting: Emergency Medicine

## 2017-07-21 DIAGNOSIS — Z21 Asymptomatic human immunodeficiency virus [HIV] infection status: Secondary | ICD-10-CM | POA: Insufficient documentation

## 2017-07-21 DIAGNOSIS — Z590 Homelessness: Secondary | ICD-10-CM | POA: Insufficient documentation

## 2017-07-21 DIAGNOSIS — B20 Human immunodeficiency virus [HIV] disease: Secondary | ICD-10-CM

## 2017-07-21 DIAGNOSIS — F909 Attention-deficit hyperactivity disorder, unspecified type: Secondary | ICD-10-CM | POA: Insufficient documentation

## 2017-07-21 DIAGNOSIS — Z79899 Other long term (current) drug therapy: Secondary | ICD-10-CM | POA: Insufficient documentation

## 2017-07-21 DIAGNOSIS — J36 Peritonsillar abscess: Secondary | ICD-10-CM | POA: Insufficient documentation

## 2017-07-21 DIAGNOSIS — F17210 Nicotine dependence, cigarettes, uncomplicated: Secondary | ICD-10-CM | POA: Insufficient documentation

## 2017-07-21 DIAGNOSIS — F329 Major depressive disorder, single episode, unspecified: Secondary | ICD-10-CM | POA: Insufficient documentation

## 2017-07-21 LAB — CBC WITH DIFFERENTIAL/PLATELET
Basophils Absolute: 0 10*3/uL (ref 0.0–0.1)
Basophils Relative: 0 %
EOS ABS: 0 10*3/uL (ref 0.0–0.7)
Eosinophils Relative: 0 %
HEMATOCRIT: 41.1 % (ref 39.0–52.0)
HEMOGLOBIN: 13.8 g/dL (ref 13.0–17.0)
LYMPHS ABS: 2.5 10*3/uL (ref 0.7–4.0)
LYMPHS PCT: 19 %
MCH: 27.2 pg (ref 26.0–34.0)
MCHC: 33.6 g/dL (ref 30.0–36.0)
MCV: 81.1 fL (ref 78.0–100.0)
MONOS PCT: 7 %
Monocytes Absolute: 0.9 10*3/uL (ref 0.1–1.0)
NEUTROS PCT: 74 %
Neutro Abs: 9.8 10*3/uL — ABNORMAL HIGH (ref 1.7–7.7)
Platelets: 223 10*3/uL (ref 150–400)
RBC: 5.07 MIL/uL (ref 4.22–5.81)
RDW: 13.1 % (ref 11.5–15.5)
WBC: 13.3 10*3/uL — ABNORMAL HIGH (ref 4.0–10.5)

## 2017-07-21 LAB — BASIC METABOLIC PANEL
Anion gap: 8 (ref 5–15)
BUN: 8 mg/dL (ref 6–20)
CHLORIDE: 103 mmol/L (ref 101–111)
CO2: 26 mmol/L (ref 22–32)
CREATININE: 0.88 mg/dL (ref 0.61–1.24)
Calcium: 9.6 mg/dL (ref 8.9–10.3)
GFR calc Af Amer: >60 mL/min (ref >60)
GFR calc non Af Amer: >60 mL/min (ref >60)
GLUCOSE: 79 mg/dL (ref 65–99)
POTASSIUM: 3.7 mmol/L (ref 3.5–5.1)
Sodium: 137 mmol/L (ref 135–145)

## 2017-07-21 LAB — RAPID STREP SCREEN (MED CTR MEBANE ONLY): Streptococcus, Group A Screen (Direct): NEGATIVE

## 2017-07-21 MED ORDER — SODIUM CHLORIDE 0.9 % IV BOLUS (SEPSIS)
1000.0000 mL | Freq: Once | INTRAVENOUS | Status: AC
  Administered 2017-07-21: 1000 mL via INTRAVENOUS

## 2017-07-21 MED ORDER — IOPAMIDOL (ISOVUE-300) INJECTION 61%
INTRAVENOUS | Status: AC
  Filled 2017-07-21: qty 75

## 2017-07-21 MED ORDER — OXYCODONE-ACETAMINOPHEN 5-325 MG PO TABS
2.0000 | ORAL_TABLET | Freq: Once | ORAL | Status: AC
  Administered 2017-07-21: 2 via ORAL
  Filled 2017-07-21: qty 2

## 2017-07-21 MED ORDER — CLINDAMYCIN PHOSPHATE 600 MG/50ML IV SOLN
600.0000 mg | Freq: Once | INTRAVENOUS | Status: AC
  Administered 2017-07-21: 600 mg via INTRAVENOUS
  Filled 2017-07-21: qty 50

## 2017-07-21 MED ORDER — OXYCODONE-ACETAMINOPHEN 5-325 MG PO TABS: 1.0000 | ORAL_TABLET | ORAL | 0 refills | Status: DC | PRN

## 2017-07-21 MED ORDER — AMOXICILLIN 400 MG/5ML PO SUSR: 1000.0000 mg | Freq: Three times a day (TID) | ORAL | 0 refills | Status: AC

## 2017-07-21 MED ORDER — DEXAMETHASONE SODIUM PHOSPHATE 10 MG/ML IJ SOLN
10.0000 mg | Freq: Once | INTRAMUSCULAR | Status: AC
Start: 2017-07-21 — End: 2017-07-21
  Administered 2017-07-21: 10 mg via INTRAVENOUS
  Filled 2017-07-21: qty 1

## 2017-07-21 NOTE — Discharge Instructions (Signed)
Call Dr. Raye Sorrow office at 0830 in the morning.  Recheck in his office tomorrow.  Take all antibiotics and your hiv medications as prescribed. Return if unable to swallow or any difficulty breathing.

## 2017-07-21 NOTE — ED Triage Notes (Signed)
Pt arrives to ED with complaints of painful swallowing x 3 days, with chills. Denies N/V, fevers. Reports hx of abscess in throat 5 years ago that had to be drained. Pt reports having HIV and not being able to take HIV medications for 3 days.

## 2017-07-21 NOTE — ED Notes (Signed)
ED Provider at bedside. 

## 2017-07-21 NOTE — ED Provider Notes (Signed)
[facility]-EMERGENCY DEPT Provider Note   CSN: [id number] Arrival date & time: 07/21/17  1141     History   Chief Complaint Chief Complaint  Patient presents with  . Dysphagia  . Sore Throat    HPI Julian Sanchez is a 21 y.o. male.  HPI  21 year old male history of HIV, noncompliant with medications who presents complaining of 3 days of right-sided throat pain. He has had some subjective fever and chills. He has had difficulty swallowing. He states he has not taken any by mouth fluids, solids, or pills for 24 hours due to pain and difficulty swallowing.He is able to speak without difficulty. He states he did have a abscess several years ago.  Past Medical History:  Diagnosis Date  . ADHD (attention deficit hyperactivity disorder)   . HIV disease (HCC)   . No pertinent past medical history   . Wisdom teeth extracted     Patient Active Problem List   Diagnosis Date Noted  . Homelessness 02/02/2017  . 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 Depression 09/10/2015  . HIV disease (HCC) 08/19/2015  . 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 Cigarette smoker 08/19/2015    Past Surgical History:  Procedure Laterality Date  . IRRIGATION AND DEBRIDEMENT ABSCESS  09/28/2012   Procedure: IRRIGATION AND DEBRIDEMENT ABSCESS;  Surgeon: Flo Shanks, MD;  Location: [facility] OR;  Service: ENT;  Laterality: Right;  Right Peritonsillar Abscess  . MOUTH SURGERY         Home Medications    Prior to Admission medications   Medication Sig Start Date End Date Taking? Authorizing Provider  bictegravir-emtricitabine-tenofovir AF (BIKTARVY) 50-200-25 MG TABS tablet Take 1 tablet by mouth daily. 05/19/17   Cliffton Asters, MD    Family History Family History  Problem Relation Age of Onset  . Diabetes Mother     Social History Social History  Substance Use Topics  . Smoking status: Light Tobacco Smoker    Packs/day: 0.40    Types: Cigarettes  . Smokeless tobacco: Never Used  . Alcohol use No      Allergies   Patient has no known allergies.   Review of Systems Review of Systems  Constitutional: Positive for activity change, appetite change and unexpected weight change.  HENT: Negative.   Eyes: Negative.   Respiratory: Negative.   Cardiovascular: Negative.   Gastrointestinal: Negative.   Genitourinary: Negative.   Musculoskeletal: Negative.   Skin: Negative.   Neurological: Negative.   Hematological: Positive for adenopathy.  Psychiatric/Behavioral: Negative.   All other systems reviewed and are negative.    Physical Exam Updated Vital Signs BP 115/78 (BP Location: Left Arm)   Pulse 94   Temp 98.5 F (36.9 C) (Oral)   Resp 14   Ht 1.829 m (6')   Wt 63.5 kg (140 lb)   SpO2 100%   BMI 18.99 kg/m   Physical Exam  Constitutional: He is oriented to person, place, and time. He appears well-developed and well-nourished.  HENT:  Head: Normocephalic and atraumatic.  Right Ear: External ear normal.  Left Ear: External ear normal.  Nose: Nose normal.  Right oropharyngeal swelling with some exudate and uvulitis  Eyes: Pupils are equal, round, and reactive to light. EOM are normal.  Neck: Normal range of motion. Neck supple.  Cardiovascular: Normal rate and regular rhythm.   Pulmonary/Chest: Effort normal and breath sounds normal.  Abdominal: Soft. Bowel sounds are normal.  Musculoskeletal: Normal range of motion.  Lymphadenopathy:    He has cervical adenopathy.  Neurological: He is alert and oriented to person, place, and time.  Skin: Skin is warm. Capillary refill takes less than 2 seconds.  Psychiatric: He has a normal mood and affect.  Nursing note and vitals reviewed.    ED Treatments / Results  Labs (all labs ordered are listed, but only abnormal results are displayed) Labs Reviewed  RAPID STREP SCREEN (NOT AT [facility])  CBC WITH DIFFERENTIAL/PLATELET  BASIC METABOLIC PANEL    EKG  EKG Interpretation None       Radiology No results  found.  Procedures Procedures (including critical care time)  Medications Ordered in ED Medications  sodium chloride 0.9 % bolus 1,000 mL (not administered)     Initial Impression / Assessment and Plan / ED Course  I have reviewed the triage vital signs and the nursing notes.  Pertinent labs & imaging results that were available during my care of the patient were reviewed by me and considered in my medical decision making (see chart for details).     21 year old male history of HIV presents today with sore throat and right throat pain CT done and patient does have abscess and some swelling. Discussed with Dr. Lazarus Salines. Patient given clindamycin, Decadron, and IV fluids here. He is tolerating fluids. He is to call Dr. Raye Sorrow  office in the morning for follow-up tomorrow. We have discussed return precautions need for close follow-up and he voices understanding.  Final Clinical Impressions(s) / ED Diagnoses   Final diagnoses:  Peritonsillar abscess  HIV (human immunodeficiency virus infection) (HCC)    New Prescriptions New Prescriptions   No medications on file     Margarita Grizzle, MD 07/21/17 2045

## 2017-07-22 ENCOUNTER — Other Ambulatory Visit: Payer: Self-pay | Admitting: Otolaryngology

## 2017-07-22 ENCOUNTER — Encounter (HOSPITAL_COMMUNITY): Payer: Self-pay

## 2017-07-22 MED FILL — BIKTARVY 50-200-25 MG TABS: 50-200-25 | 30 days supply | Qty: 30 | Fill #1

## 2017-07-22 NOTE — Anesthesia Preprocedure Evaluation (Signed)
Anesthesia Evaluation  Patient identified by MRN, date of birth, ID band Patient awake    Reviewed: Allergy & Precautions, H&P , Patient's Chart, lab work & pertinent test results, reviewed documented beta blocker date and time   Airway Mallampati: II  TM Distance: >3 FB Neck ROM: full    Dental no notable dental hx.    Pulmonary Current Smoker,    Pulmonary exam normal breath sounds clear to auscultation       Cardiovascular  Rhythm:regular Rate:Normal     Neuro/Psych    GI/Hepatic   Endo/Other    Renal/GU      Musculoskeletal   Abdominal   Peds  Hematology   Anesthesia Other Findings   Reproductive/Obstetrics                             Anesthesia Physical Anesthesia Plan  ASA: II  Anesthesia Plan: General   Post-op Pain Management:    Induction: Intravenous  PONV Risk Score and Plan: 2 and Ondansetron, Dexamethasone, Scopolamine patch - Pre-op and Treatment may vary due to age or medical condition  Airway Management Planned: Oral ETT  Additional Equipment:   Intra-op Plan:   Post-operative Plan: Extubation in OR  Informed Consent: I have reviewed the patients History and Physical, chart, labs and discussed the procedure including the risks, benefits and alternatives for the proposed anesthesia with the patient or authorized representative who has indicated his/her understanding and acceptance.   Dental Advisory Given  Plan Discussed with: CRNA and Surgeon  Anesthesia Plan Comments: (  )        Anesthesia Quick Evaluation

## 2017-07-22 NOTE — Progress Notes (Signed)
Unable to contact patient. Spoke with mom on the phone and gave her pre-op instructions. Instructed mom that patient should take precocet and antiviral medication before surgery.

## 2017-07-23 ENCOUNTER — Encounter (HOSPITAL_COMMUNITY): Payer: Self-pay

## 2017-07-23 ENCOUNTER — Ambulatory Visit (HOSPITAL_COMMUNITY): Payer: Self-pay | Admitting: Anesthesiology

## 2017-07-23 ENCOUNTER — Encounter (HOSPITAL_COMMUNITY)
Admission: RE | Disposition: A | Discharge status: Home / Self Care | Payer: Self-pay | Source: Ambulatory Visit | Attending: Otolaryngology

## 2017-07-23 ENCOUNTER — Observation Stay (HOSPITAL_COMMUNITY)
Admission: RE | Admit: 2017-07-23 | Discharge: 2017-07-24 | Disposition: A | Discharge status: Home / Self Care | Payer: Self-pay | Source: Ambulatory Visit | Attending: Otolaryngology | Admitting: Otolaryngology

## 2017-07-23 DIAGNOSIS — R590 Localized enlarged lymph nodes: Secondary | ICD-10-CM | POA: Insufficient documentation

## 2017-07-23 DIAGNOSIS — Z23 Encounter for immunization: Secondary | ICD-10-CM | POA: Insufficient documentation

## 2017-07-23 DIAGNOSIS — J36 Peritonsillar abscess: Principal | ICD-10-CM | POA: Diagnosis present

## 2017-07-23 DIAGNOSIS — F909 Attention-deficit hyperactivity disorder, unspecified type: Secondary | ICD-10-CM | POA: Insufficient documentation

## 2017-07-23 DIAGNOSIS — Z21 Asymptomatic human immunodeficiency virus [HIV] infection status: Secondary | ICD-10-CM | POA: Insufficient documentation

## 2017-07-23 DIAGNOSIS — J351 Hypertrophy of tonsils: Secondary | ICD-10-CM | POA: Insufficient documentation

## 2017-07-23 DIAGNOSIS — J45909 Unspecified asthma, uncomplicated: Secondary | ICD-10-CM | POA: Insufficient documentation

## 2017-07-23 DIAGNOSIS — F17210 Nicotine dependence, cigarettes, uncomplicated: Secondary | ICD-10-CM | POA: Insufficient documentation

## 2017-07-23 HISTORY — DX: Acute recurrent tonsillitis, unspecified: J03.91

## 2017-07-23 HISTORY — DX: Allergy status to unspecified drugs, medicaments and biological substances: Z88.9

## 2017-07-23 HISTORY — PX: TONSILLECTOMY: SHX5217

## 2017-07-23 SURGERY — TONSILLECTOMY
Anesthesia: General | Site: Throat

## 2017-07-23 MED ORDER — LIDOCAINE 2% (20 MG/ML) 5 ML SYRINGE
INTRAMUSCULAR | Status: AC
  Filled 2017-07-23: qty 5

## 2017-07-23 MED ORDER — HYDROMORPHONE HCL 1 MG/ML IJ SOLN
INTRAMUSCULAR | Status: DC | PRN
  Administered 2017-07-23: 0.5 mg via INTRAVENOUS

## 2017-07-23 MED ORDER — ONDANSETRON HCL 4 MG/2ML IJ SOLN
INTRAMUSCULAR | Status: AC
  Filled 2017-07-23: qty 2

## 2017-07-23 MED ORDER — ROCURONIUM BROMIDE 50 MG/5ML IV SOLN
INTRAVENOUS | Status: AC
  Filled 2017-07-23: qty 1

## 2017-07-23 MED ORDER — OXYCODONE-ACETAMINOPHEN 5-325 MG/5ML PO SOLN: 5.0000 mL | Freq: Four times a day (QID) | ORAL | 0 refills | Status: DC | PRN

## 2017-07-23 MED ORDER — PROPOFOL 10 MG/ML IV BOLUS
INTRAVENOUS | Status: DC | PRN
  Administered 2017-07-23: 200 mg via INTRAVENOUS

## 2017-07-23 MED ORDER — MORPHINE SULFATE (PF) 2 MG/ML IV SOLN: 2.0000 mg | INTRAVENOUS | Status: DC | PRN

## 2017-07-23 MED ORDER — ONDANSETRON HCL 4 MG/2ML IJ SOLN
INTRAMUSCULAR | Status: DC | PRN
  Administered 2017-07-23: 4 mg via INTRAVENOUS

## 2017-07-23 MED ORDER — DEXAMETHASONE SODIUM PHOSPHATE 10 MG/ML IJ SOLN
INTRAMUSCULAR | Status: AC
  Filled 2017-07-23: qty 1

## 2017-07-23 MED ORDER — 0.9 % SODIUM CHLORIDE (POUR BTL) OPTIME
TOPICAL | Status: DC | PRN
  Administered 2017-07-23: 1000 mL

## 2017-07-23 MED ORDER — ARTIFICIAL TEARS OPHTHALMIC OINT
TOPICAL_OINTMENT | OPHTHALMIC | Status: AC
  Filled 2017-07-23: qty 3.5

## 2017-07-23 MED ORDER — KCL IN DEXTROSE-NACL 20-5-0.45 MEQ/L-%-% IV SOLN
INTRAVENOUS | Status: DC
  Administered 2017-07-23 (×2): via INTRAVENOUS
  Administered 2017-07-24: 1 mL via INTRAVENOUS
  Filled 2017-07-23 (×3): qty 1000

## 2017-07-23 MED ORDER — FENTANYL CITRATE (PF) 100 MCG/2ML IJ SOLN
25.0000 ug | INTRAMUSCULAR | Status: DC | PRN
  Administered 2017-07-23: 100 ug via INTRAVENOUS

## 2017-07-23 MED ORDER — NEOSTIGMINE METHYLSULFATE 5 MG/5ML IV SOSY
PREFILLED_SYRINGE | INTRAVENOUS | Status: AC
  Filled 2017-07-23: qty 5

## 2017-07-23 MED ORDER — SUGAMMADEX SODIUM 200 MG/2ML IV SOLN
INTRAVENOUS | Status: DC | PRN
  Administered 2017-07-23: 200 mg via INTRAVENOUS

## 2017-07-23 MED ORDER — ONDANSETRON HCL 4 MG/2ML IJ SOLN
INTRAMUSCULAR | Status: AC
Start: 2017-07-23 — End: 2017-07-23
  Filled 2017-07-23: qty 2

## 2017-07-23 MED ORDER — CHLORHEXIDINE GLUCONATE CLOTH 2 % EX PADS: 6.0000 | MEDICATED_PAD | Freq: Once | CUTANEOUS | Status: DC

## 2017-07-23 MED ORDER — ROCURONIUM BROMIDE 10 MG/ML (PF) SYRINGE
PREFILLED_SYRINGE | INTRAVENOUS | Status: DC | PRN
  Administered 2017-07-23: 40 mg via INTRAVENOUS

## 2017-07-23 MED ORDER — FENTANYL CITRATE (PF) 100 MCG/2ML IJ SOLN
INTRAMUSCULAR | Status: AC
  Filled 2017-07-23: qty 2

## 2017-07-23 MED ORDER — PROMETHAZINE HCL 25 MG RE SUPP: 25.0000 mg | Freq: Four times a day (QID) | RECTAL | Status: DC | PRN

## 2017-07-23 MED ORDER — CLINDAMYCIN PHOSPHATE 600 MG/50ML IV SOLN
600.0000 mg | Freq: Three times a day (TID) | INTRAVENOUS | Status: DC
  Administered 2017-07-23 – 2017-07-24 (×3): 600 mg via INTRAVENOUS
  Filled 2017-07-23 (×4): qty 50

## 2017-07-23 MED ORDER — OXYCODONE HCL 5 MG/5ML PO SOLN
5.0000 mg | ORAL | Status: DC | PRN
  Administered 2017-07-23 – 2017-07-24 (×6): 10 mg via ORAL
  Filled 2017-07-23 (×5): qty 10

## 2017-07-23 MED ORDER — INFLUENZA VAC SPLIT QUAD 0.5 ML IM SUSY
0.5000 mL | PREFILLED_SYRINGE | INTRAMUSCULAR | Status: AC
  Administered 2017-07-24: 0.5 mL via INTRAMUSCULAR
  Filled 2017-07-23: qty 0.5

## 2017-07-23 MED ORDER — FENTANYL CITRATE (PF) 250 MCG/5ML IJ SOLN
INTRAMUSCULAR | Status: AC
  Filled 2017-07-23: qty 5

## 2017-07-23 MED ORDER — PROMETHAZINE HCL 25 MG PO TABS: 25.0000 mg | ORAL_TABLET | Freq: Four times a day (QID) | ORAL | Status: DC | PRN

## 2017-07-23 MED ORDER — PROPOFOL 10 MG/ML IV BOLUS
INTRAVENOUS | Status: AC
  Filled 2017-07-23: qty 40

## 2017-07-23 MED ORDER — CLINDAMYCIN PHOSPHATE 600 MG/50ML IV SOLN
600.0000 mg | INTRAVENOUS | Status: AC
  Administered 2017-07-23: 600 mg via INTRAVENOUS
  Filled 2017-07-23: qty 50

## 2017-07-23 MED ORDER — ACETAMINOPHEN 160 MG/5ML PO SOLN
ORAL | Status: AC
  Filled 2017-07-23: qty 20.3

## 2017-07-23 MED ORDER — LACTATED RINGERS IV SOLN
INTRAVENOUS | Status: DC | PRN
  Administered 2017-07-23 (×2): via INTRAVENOUS

## 2017-07-23 MED ORDER — LIDOCAINE 2% (20 MG/ML) 5 ML SYRINGE
INTRAMUSCULAR | Status: DC | PRN
  Administered 2017-07-23: 100 mg via INTRAVENOUS

## 2017-07-23 MED ORDER — MIDAZOLAM HCL 5 MG/5ML IJ SOLN
INTRAMUSCULAR | Status: DC | PRN
  Administered 2017-07-23: 2 mg via INTRAVENOUS

## 2017-07-23 MED ORDER — HYDROMORPHONE HCL 1 MG/ML IJ SOLN
INTRAMUSCULAR | Status: AC
  Filled 2017-07-23: qty 0.5

## 2017-07-23 MED ORDER — FENTANYL CITRATE (PF) 100 MCG/2ML IJ SOLN
INTRAMUSCULAR | Status: DC | PRN
Start: 2017-07-23 — End: 2017-07-23
  Administered 2017-07-23 (×3): 50 ug via INTRAVENOUS
  Administered 2017-07-23: 100 ug via INTRAVENOUS

## 2017-07-23 MED ORDER — DEXAMETHASONE SODIUM PHOSPHATE 10 MG/ML IJ SOLN
INTRAMUSCULAR | Status: DC | PRN
  Administered 2017-07-23: 10 mg via INTRAVENOUS

## 2017-07-23 MED ORDER — ACETAMINOPHEN 160 MG/5ML PO SOLN
325.0000 mg | ORAL | Status: DC | PRN
  Administered 2017-07-23: 650 mg via ORAL

## 2017-07-23 MED ORDER — BICTEGRAVIR-EMTRICITAB-TENOFOV 50-200-25 MG PO TABS
1.0000 | ORAL_TABLET | Freq: Every day | ORAL | Status: DC
  Administered 2017-07-24: 1 via ORAL
  Filled 2017-07-23 (×2): qty 1

## 2017-07-23 MED ORDER — OXYCODONE HCL 5 MG/5ML PO SOLN
ORAL | Status: AC
  Filled 2017-07-23: qty 10

## 2017-07-23 MED ORDER — MIDAZOLAM HCL 2 MG/2ML IJ SOLN
INTRAMUSCULAR | Status: AC
  Filled 2017-07-23: qty 2

## 2017-07-23 SURGICAL SUPPLY — 33 items
BLADE SURG 15 STRL LF DISP TIS (BLADE) IMPLANT
BLADE SURG 15 STRL SS (BLADE)
CANISTER SUCT 3000ML PPV (MISCELLANEOUS) ×3 IMPLANT
CATH ROBINSON RED A/P 10FR (CATHETERS) ×3 IMPLANT
CLEANER TIP ELECTROSURG 2X2 (MISCELLANEOUS) ×3 IMPLANT
COAGULATOR SUCT 6 FR SWTCH (ELECTROSURGICAL) ×1
COAGULATOR SUCT SWTCH 10FR 6 (ELECTROSURGICAL) ×2 IMPLANT
CRADLE DONUT ADULT HEAD (MISCELLANEOUS) IMPLANT
DRAPE HALF SHEET 40X57 (DRAPES) IMPLANT
ELECT COATED BLADE 2.86 ST (ELECTRODE) ×3 IMPLANT
ELECT REM PT RETURN 9FT ADLT (ELECTROSURGICAL)
ELECT REM PT RETURN 9FT PED (ELECTROSURGICAL)
ELECTRODE REM PT RETRN 9FT PED (ELECTROSURGICAL) IMPLANT
ELECTRODE REM PT RTRN 9FT ADLT (ELECTROSURGICAL) IMPLANT
GAUZE SPONGE 4X4 16PLY XRAY LF (GAUZE/BANDAGES/DRESSINGS) IMPLANT
GLOVE BIO SURGEON STRL SZ7.5 (GLOVE) ×3 IMPLANT
GOWN STRL REUS W/ TWL LRG LVL3 (GOWN DISPOSABLE) ×2 IMPLANT
GOWN STRL REUS W/TWL LRG LVL3 (GOWN DISPOSABLE) ×4
KIT BASIN OR (CUSTOM PROCEDURE TRAY) ×3 IMPLANT
KIT ROOM TURNOVER OR (KITS) ×3 IMPLANT
NEEDLE HYPO 25GX1X1/2 BEV (NEEDLE) IMPLANT
NS IRRIG 1000ML POUR BTL (IV SOLUTION) ×3 IMPLANT
PACK SURGICAL SETUP 50X90 (CUSTOM PROCEDURE TRAY) ×3 IMPLANT
PAD ARMBOARD 7.5X6 YLW CONV (MISCELLANEOUS) ×6 IMPLANT
PENCIL BUTTON HOLSTER BLD 10FT (ELECTRODE) ×3 IMPLANT
SPECIMEN JAR SMALL (MISCELLANEOUS) ×6 IMPLANT
SPONGE TONSIL 1.25 RF SGL STRG (GAUZE/BANDAGES/DRESSINGS) ×3 IMPLANT
SYR BULB 3OZ (MISCELLANEOUS) ×3 IMPLANT
TUBE CONNECTING 12'X1/4 (SUCTIONS) ×1
TUBE CONNECTING 12X1/4 (SUCTIONS) ×2 IMPLANT
TUBE SALEM SUMP 14F W/ARV (TUBING) ×3 IMPLANT
TUBE SALEM SUMP 16 FR W/ARV (TUBING) IMPLANT
YANKAUER SUCT BULB TIP NO VENT (SUCTIONS) ×3 IMPLANT

## 2017-07-23 NOTE — Transfer of Care (Signed)
Immediate Anesthesia Transfer of Care Note  Patient: Julian Sanchez  Procedure(s) Performed: TONSILLECTOMY (N/A Throat)  Patient Location: PACU  Anesthesia Type:General  Level of Consciousness: patient cooperative and responds to stimulation  Airway & Oxygen Therapy: Patient Spontanous Breathing and Patient connected to face mask oxygen  Post-op Assessment: Report given to RN, Post -op Vital signs reviewed and stable and Patient moving all extremities X 4  Post vital signs: Reviewed and stable  Last Vitals:  Vitals:   07/23/17 0642  BP: 115/63  Pulse: 78  Resp: 16  Temp: 36.7 C  SpO2: 100%    Last Pain:  Vitals:   07/23/17 0642  TempSrc: Oral  PainSc: 7       Patients Stated Pain Goal: 3 (07/23/17 4540)  Complications: No apparent anesthesia complications

## 2017-07-23 NOTE — Anesthesia Postprocedure Evaluation (Signed)
Anesthesia Post Note  Patient: Arlyss Repress  Procedure(s) Performed: TONSILLECTOMY (N/A Throat)     Patient location during evaluation: PACU Anesthesia Type: General Level of consciousness: awake and alert Pain management: pain level controlled Vital Signs Assessment: post-procedure vital signs reviewed and stable Respiratory status: spontaneous breathing, nonlabored ventilation, respiratory function stable and patient connected to nasal cannula oxygen Cardiovascular status: blood pressure returned to baseline and stable Postop Assessment: no apparent nausea or vomiting Anesthetic complications: no    Last Vitals:  Vitals:   07/23/17 1035 07/23/17 1228  BP: 111/76 111/66  Pulse: 62 65  Resp:    Temp: 37 C 36.9 C  SpO2: 100% 100%    Last Pain:  Vitals:   07/23/17 1240  TempSrc:   PainSc: 7                  Evadean Sproule EDWARD

## 2017-07-23 NOTE — Op Note (Signed)
NAME:  Julian Sanchez, Julian Sanchez NO.:  MEDICAL RECORD NO.:  [id number]  LOCATION:                                 FACILITY:  PHYSICIAN:  Antony Contras, MD     DATE OF BIRTH:  06-14-1996  DATE OF PROCEDURE:  07/23/2017 DATE OF DISCHARGE:                              OPERATIVE REPORT   PREOPERATIVE DIAGNOSIS:  Recurrent peritonsillar abscess.  POSTOPERATIVE DIAGNOSIS:  Recurrent peritonsillar abscess.  PROCEDURE:  Tonsillectomy.  SURGEON:  Antony Contras, MD.  ANESTHESIA:  General endotracheal anesthesia.  COMPLICATIONS:  None.  INDICATION:  The patient is a 21 year old male with a 4-day history of right-sided tonsil pain and swelling, who was found to have a peritonsillar abscess by CT.  He had a previous right peritonsillar abscess drained in 2013.  At this time, he has elected to proceed with tonsillectomy and presents after a surgical management.  FINDINGS:  There was fullness in the right peritonsillar region and dissection into the subcapsular plane yielded yellow pus.  Both tonsils were removed and sent separately for pathology.  DESCRIPTION OF PROCEDURE:  The patient was identified in the holding room, and informed consent having been obtained including discussion of risks, benefits, and alternatives, the patient was brought to the operative suite and put on the operating table in supine position. Anesthesia was induced and the patient was intubated by the Anesthesia team without difficulty.  The patient was given intravenous antibiotics and steroids during the case.  The eyes taped closed and bed was turned 90 degrees from anesthesia.  A head wrap was placed around the patient's head, and Crowe-Davis retractor was inserted in the mouth and opened via the oropharynx.  This was placed in suspension on Mayo stand.  The right tonsil was grasped with a curved Allis and retracted medially while a curvilinear incision was made along the anterior tonsillar  pillar using Bovie electrocautery on a setting of 20.  Dissection continued in the subcapsular plane where the pocket of pus was encountered and suctioned. Dissection continued in that plane using Bovie electrocautery until the tonsil was removed.  The same was then performed on the left side, and the tonsils were passed to Nursing for pathology.  Bleeding was then controlled on both sides using suction cautery on a setting of 30. After this was completed, the nose and throat were copiously irrigated with saline and a flexible catheter was passed down the esophagus to suction out the stomach and esophagus.  Retractor was taken out of suspension and removed from the patient's mouth.  He was turned back to Anesthesia for wake-up, was extubated and moved to the recovery room in stable condition.     Antony Contras, MD     DDB/MEDQ  D:  07/23/2017  T:  07/23/2017  Job:  [id number]  cc:   Antony Contras, MD's office

## 2017-07-23 NOTE — H&P (Signed)
Julian Sanchez is an 21 y.o. male.   Chief Complaint: Peritonsillar abscess HPI: 21 year old male with four day history of worsening right-sided throat pain.  Found to have peritonsillar abscess by CT.  He had a similar problem five years ago.  He has elected to proceed with tonsillectomy.  Past Medical History:  Diagnosis Date  . ADHD (attention deficit hyperactivity disorder)   . Asthma   . HIV disease ([place])   . No pertinent past medical history   . Recurrent tonsillitis   . Wisdom teeth extracted     Past Surgical History:  Procedure Laterality Date  . IRRIGATION AND DEBRIDEMENT ABSCESS  09/28/2012   Procedure: IRRIGATION AND DEBRIDEMENT ABSCESS;  Surgeon: Jodi Marble, MD;  Location: [place];  Service: ENT;  Laterality: Right;  Right Peritonsillar Abscess  . MOUTH SURGERY      Family History  Problem Relation Age of Onset  . Diabetes Mother   . Asthma Father    Social History:  reports that he has been smoking Cigarettes.  He has been smoking about 0.40 packs per day. He has never used smokeless tobacco. He reports that he does not drink alcohol or use drugs.  Allergies: No Known Allergies  Medications Prior to Admission  Medication Sig Dispense Refill  . amoxicillin (AMOXIL) 400 MG/5ML suspension Take 12.5 mLs (1,000 mg total) by mouth 3 (three) times daily. Take one gram three times a day for three days, then 500 mg three times per day for one week. 100 mL 0  . bictegravir-emtricitabine-tenofovir AF (BIKTARVY) 50-200-25 MG TABS tablet Take 1 tablet by mouth daily. 30 tablet 1  . oxyCODONE-acetaminophen (PERCOCET/ROXICET) 5-325 MG tablet Take 1 tablet by mouth every 4 (four) hours as needed for severe pain. 10 tablet 0    Results for orders placed or performed during the hospital encounter of 07/21/17 (from the past 48 hour(s))  Rapid strep screen (not at [facility])     Status: None   Collection Time: 07/21/17  5:07 PM  Result Value Ref Range   Streptococcus, Group A Screen  (Direct) NEGATIVE NEGATIVE    Comment: (NOTE) A Rapid Antigen test may result negative if the antigen level in the sample is below the detection level of this test. The FDA has not cleared this test as a stand-alone test therefore the rapid antigen negative result has reflexed to a Group A Strep culture.   Culture, group A strep     Status: None (Preliminary result)   Collection Time: 07/21/17  5:07 PM  Result Value Ref Range   Specimen Description THROAT    Special Requests NONE Reflexed from [id number]    Culture TOO YOUNG TO READ    Report Status PENDING   CBC with Differential/Platelet     Status: Abnormal   Collection Time: 07/21/17  6:25 PM  Result Value Ref Range   WBC 13.3 (H) 4.0 - 10.5 K/uL   RBC 5.07 4.22 - 5.81 MIL/uL   Hemoglobin 13.8 13.0 - 17.0 g/dL   HCT 41.1 39.0 - 52.0 %   MCV 81.1 78.0 - 100.0 fL   MCH 27.2 26.0 - 34.0 pg   MCHC 33.6 30.0 - 36.0 g/dL   RDW 13.1 11.5 - 15.5 %   Platelets 223 150 - 400 K/uL   Neutrophils Relative % 74 %   Neutro Abs 9.8 (H) 1.7 - 7.7 K/uL   Lymphocytes Relative 19 %   Lymphs Abs 2.5 0.7 - 4.0 K/uL  Monocytes Relative 7 %   Monocytes Absolute 0.9 0.1 - 1.0 K/uL   Eosinophils Relative 0 %   Eosinophils Absolute 0.0 0.0 - 0.7 K/uL   Basophils Relative 0 %   Basophils Absolute 0.0 0.0 - 0.1 K/uL  Basic metabolic panel     Status: None   Collection Time: 07/21/17  6:25 PM  Result Value Ref Range   Sodium 137 135 - 145 mmol/L   Potassium 3.7 3.5 - 5.1 mmol/L   Chloride 103 101 - 111 mmol/L   CO2 26 22 - 32 mmol/L   Glucose, Bld 79 65 - 99 mg/dL   BUN 8 6 - 20 mg/dL   Creatinine, Ser 0.88 0.61 - 1.24 mg/dL   Calcium 9.6 8.9 - 10.3 mg/dL   GFR calc non Af Amer >60 >60 mL/min   GFR calc Af Amer >60 >60 mL/min    Comment: (NOTE) The eGFR has been calculated using the CKD EPI equation. This calculation has not been validated in all clinical situations. eGFR's persistently <60 mL/min signify possible Chronic Kidney Disease.     Anion gap 8 5 - 15   Ct Soft Tissue Neck W Contrast  Result Date: 07/21/2017 CLINICAL DATA:  Painful swallowing EXAM: CT NECK WITH CONTRAST TECHNIQUE: Multidetector CT imaging of the neck was performed using the standard protocol following the bolus administration of intravenous contrast. CONTRAST:  75 mL Isovue-300 COMPARISON:  None. FINDINGS: Pharynx and larynx: --Nasopharynx: Fossae of Rosenmuller are clear. Normal adenoid tonsils for age. --Oral cavity and oropharynx: The right palatine tonsils are markedly enlarged. There is heterogeneous enhancement with rounded areas of fluid attenuation consistent with peritonsillar abscess. --Hypopharynx: Normal vallecula and pyriform sinuses. --Larynx: Normal epiglottis and pre-epiglottic space. Normal aryepiglottic and vocal folds. --Retropharyngeal space: Mild retropharyngeal effusion on the right measuring up to 3 mm. No abnormal retropharyngeal enhancement. Salivary glands: --Parotid: No mass lesion or inflammation. No sialolithiasis or ductal dilatation. --Submandibular: Symmetric without inflammation. No sialolithiasis or ductal dilatation. --Sublingual: Normal. No ranula or other visible lesion of the base of tongue and floor of mouth. Thyroid: Normal. Lymph nodes: There are bilateral enlarged level 2A lymph nodes. Vascular: Major cervical vessels are patent. Limited intracranial: Normal. Visualized orbits: Normal. Mastoids and visualized paranasal sinuses: No fluid levels or advanced mucosal thickening. No mastoid effusion. Skeleton: No bony spinal canal stenosis. No lytic or blastic lesions. Upper chest: Clear. Other: None. IMPRESSION: 1. Right palatine tonsil peritonsillar abscess with extensive tonsillar edema. Small, right eccentric retropharyngeal effusion. 2. Reactive bilateral cervical lymphadenopathy.  Barb Electronically Signed   By: Ulyses Jarred M.D.   On: 07/21/2017 19:27    Review of Systems  HENT: Positive for sore throat.   All other  systems reviewed and are negative.   Blood pressure 115/63, pulse 78, temperature 98.1 F (36.7 C), temperature source Oral, resp. rate 16, height 6' (1.829 m), weight 145 lb (65.8 kg), SpO2 100 %. Physical Exam  Constitutional: He is oriented to person, place, and time. He appears well-developed and well-nourished. No distress.  HENT:  Head: Normocephalic and atraumatic.  Right Ear: External ear normal.  Left Ear: External ear normal.  Nose: Nose normal.  Mouth/Throat: Oropharynx is clear and moist.  Right peritonsillar fullness  Eyes: Pupils are equal, round, and reactive to light. Conjunctivae and EOM are normal.  Neck: Normal range of motion. Neck supple.  Cardiovascular: Normal rate.   Respiratory: Effort normal.  Musculoskeletal: Normal range of motion.  Neurological: He is alert and oriented  to person, place, and time. No cranial nerve deficit.  Skin: Skin is warm and dry.  Psychiatric: He has a normal mood and affect. His behavior is normal. Judgment and thought content normal.     Assessment/Plan Right peritonsillar abscess  To OR for tonsillectomy.  Melida Quitter, MD 07/23/2017, 7:22 AM

## 2017-07-23 NOTE — Brief Op Note (Signed)
07/23/2017  8:07 AM  PATIENT:  Julian Sanchez  21 y.o. male  PRE-OPERATIVE DIAGNOSIS:  PERITONSILLAR ABSCESS  POST-OPERATIVE DIAGNOSIS:  PERITONSILLAR ABSCESS  PROCEDURE:  Procedure(s): TONSILLECTOMY (N/A)  SURGEON:  Surgeon(s) and Role:    * Christia Reading, MD - Primary  PHYSICIAN ASSISTANT:   ASSISTANTS: none   ANESTHESIA:   general  EBL:  No intake/output data recorded.  BLOOD ADMINISTERED:none  DRAINS: none   LOCAL MEDICATIONS USED:  NONE  SPECIMEN:  Source of Specimen:  Right and left tonsils  DISPOSITION OF SPECIMEN:  PATHOLOGY  COUNTS:  YES  TOURNIQUET:  * No tourniquets in log *  DICTATION: .Other Dictation: Dictation Number [id number]  PLAN OF CARE: Admit for overnight observation  PATIENT DISPOSITION:  PACU - hemodynamically stable.   Delay start of Pharmacological VTE agent (>24hrs) due to surgical blood loss or risk of bleeding: no

## 2017-07-23 NOTE — Discharge Instructions (Signed)
Resume and complete amoxicillin course.  Drink plenty of fluids.  Advance diet as able.  Call with bleeding, fever over 102, or inability to drink.

## 2017-07-23 NOTE — Progress Notes (Signed)
1030 Received pt from PACU, sleepy. Call light with in reach.

## 2017-07-24 ENCOUNTER — Encounter (HOSPITAL_COMMUNITY): Payer: Self-pay | Admitting: Otolaryngology

## 2017-07-24 LAB — CULTURE, GROUP A STREP (THRC)

## 2017-07-24 NOTE — Progress Notes (Signed)
   ENT Progress Note: POD #1 s/p Procedure(s): TONSILLECTOMY   Subjective: tol po fluids  Objective: Vital signs in last 24 hours: Temp:  [97.5 F (36.4 C)-98.6 F (37 C)] 98.1 F (36.7 C) (10/13 0617) Pulse Rate:  [53-97] 62 (10/13 0617) Resp:  [8-18] 16 (10/13 0617) BP: (101-121)/(60-89) 117/77 (10/13 0617) SpO2:  [99 %-100 %] 99 % (10/13 0617) Weight:  [63.6 kg (140 lb 4.8 oz)] 63.6 kg (140 lb 4.8 oz) (10/13 0617) Weight change: -2.132 kg (-4 lb 11.2 oz)    Intake/Output from previous day: 10/12 0701 - 10/13 0700 In: 3180.3 [P.O.:282; I.V.:2748.3; IV Piggyback:150] Out: -  Intake/Output this shift: No intake/output data recorded.  Labs:  Recent Labs  07/21/17 1825  WBC 13.3*  HGB 13.8  HCT 41.1  PLT 223    Recent Labs  07/21/17 1825  NA 137  K 3.7  CL 103  CO2 26  GLUCOSE 79  BUN 8  CALCIUM 9.6    Studies/Results: No results found.   PHYSICAL EXAM: Tonsillar fossa intact No bleeding   Assessment/Plan: Stable postop D/C to home    Julian Sanchez 07/24/2017, 9:17 AM

## 2017-07-24 NOTE — Discharge Summary (Signed)
Physician Discharge Summary  Patient ID: CAEDIN MOGAN MRN: [id number] DOB/AGE: 1996-05-18 21 y.o.  Admit date: 07/23/2017 Discharge date: 07/24/2017  Admission Diagnoses:  Active Problems:   Peritonsillar abscess   Discharge Diagnoses:  Same  Surgeries: Procedure(s): TONSILLECTOMY on 07/23/2017   Consultants: None  Discharged Condition: Improved  Hospital Course: BARTLETT ENKE is an 21 y.o. male who was admitted 07/23/2017 with a diagnosis of Active Problems:   Peritonsillar abscess  and went to the operating room on 07/23/2017 and underwent the above named procedures.   Pt s/p tonsillectomy. Stable postop day 1. D/C to home.  Recent vital signs:  Vitals:   07/24/17 0203 07/24/17 0617  BP: 119/89 117/77  Pulse: 62 62  Resp: 18 16  Temp: 98.2 F (36.8 C) 98.1 F (36.7 C)  SpO2: 100% 99%    Recent laboratory studies:  Results for orders placed or performed during the hospital encounter of 07/21/17  Rapid strep screen (not at [facility])  Result Value Ref Range   Streptococcus, Group A Screen (Direct) NEGATIVE NEGATIVE  Culture, group A strep  Result Value Ref Range   Specimen Description THROAT    Special Requests NONE Reflexed from [id number]    Culture CULTURE REINCUBATED FOR BETTER GROWTH    Report Status PENDING   CBC with Differential/Platelet  Result Value Ref Range   WBC 13.3 (H) 4.0 - 10.5 K/uL   RBC 5.07 4.22 - 5.81 MIL/uL   Hemoglobin 13.8 13.0 - 17.0 g/dL   HCT 91.4 78.2 - 95.6 %   MCV 81.1 78.0 - 100.0 fL   MCH 27.2 26.0 - 34.0 pg   MCHC 33.6 30.0 - 36.0 g/dL   RDW 21.3 08.6 - 57.8 %   Platelets 223 150 - 400 K/uL   Neutrophils Relative % 74 %   Neutro Abs 9.8 (H) 1.7 - 7.7 K/uL   Lymphocytes Relative 19 %   Lymphs Abs 2.5 0.7 - 4.0 K/uL   Monocytes Relative 7 %   Monocytes Absolute 0.9 0.1 - 1.0 K/uL   Eosinophils Relative 0 %   Eosinophils Absolute 0.0 0.0 - 0.7 K/uL   Basophils Relative 0 %   Basophils Absolute 0.0 0.0 - 0.1 K/uL  Basic  metabolic panel  Result Value Ref Range   Sodium 137 135 - 145 mmol/L   Potassium 3.7 3.5 - 5.1 mmol/L   Chloride 103 101 - 111 mmol/L   CO2 26 22 - 32 mmol/L   Glucose, Bld 79 65 - 99 mg/dL   BUN 8 6 - 20 mg/dL   Creatinine, Ser 4.69 0.61 - 1.24 mg/dL   Calcium 9.6 8.9 - 62.9 mg/dL   GFR calc non Af Amer >60 >60 mL/min   GFR calc Af Amer >60 >60 mL/min   Anion gap 8 5 - 15    Discharge Medications:   Allergies as of 07/24/2017   No Known Allergies     Medication List    TAKE these medications   amoxicillin 400 MG/5ML suspension Commonly known as:  AMOXIL Take 12.5 mLs (1,000 mg total) by mouth 3 (three) times daily. Take one gram three times a day for three days, then 500 mg three times per day for one week.   bictegravir-emtricitabine-tenofovir AF 50-200-25 MG Tabs tablet Commonly known as:  BIKTARVY Take 1 tablet by mouth daily.   oxyCODONE-acetaminophen 5-325 MG tablet Commonly known as:  PERCOCET/ROXICET Take 1 tablet by mouth every 4 (four) hours as needed for severe pain. What  changed:  Another medication with the same name was added. Make sure you understand how and when to take each.   oxyCODONE-acetaminophen 5-325 MG/5ML solution Commonly known as:  ROXICET Take 5-10 mLs by mouth every 6 (six) hours as needed for severe pain. What changed:  You were already taking a medication with the same name, and this prescription was added. Make sure you understand how and when to take each.       Diagnostic Studies: Ct Soft Tissue Neck W Contrast  Result Date: 07/21/2017 CLINICAL DATA:  Painful swallowing EXAM: CT NECK WITH CONTRAST TECHNIQUE: Multidetector CT imaging of the neck was performed using the standard protocol following the bolus administration of intravenous contrast. CONTRAST:  75 mL Isovue-300 COMPARISON:  None. FINDINGS: Pharynx and larynx: --Nasopharynx: Fossae of Rosenmuller are clear. Normal adenoid tonsils for age. --Oral cavity and oropharynx: The  right palatine tonsils are markedly enlarged. There is heterogeneous enhancement with rounded areas of fluid attenuation consistent with peritonsillar abscess. --Hypopharynx: Normal vallecula and pyriform sinuses. --Larynx: Normal epiglottis and pre-epiglottic space. Normal aryepiglottic and vocal folds. --Retropharyngeal space: Mild retropharyngeal effusion on the right measuring up to 3 mm. No abnormal retropharyngeal enhancement. Salivary glands: --Parotid: No mass lesion or inflammation. No sialolithiasis or ductal dilatation. --Submandibular: Symmetric without inflammation. No sialolithiasis or ductal dilatation. --Sublingual: Normal. No ranula or other visible lesion of the base of tongue and floor of mouth. Thyroid: Normal. Lymph nodes: There are bilateral enlarged level 2A lymph nodes. Vascular: Major cervical vessels are patent. Limited intracranial: Normal. Visualized orbits: Normal. Mastoids and visualized paranasal sinuses: No fluid levels or advanced mucosal thickening. No mastoid effusion. Skeleton: No bony spinal canal stenosis. No lytic or blastic lesions. Upper chest: Clear. Other: None. IMPRESSION: 1. Right palatine tonsil peritonsillar abscess with extensive tonsillar edema. Small, right eccentric retropharyngeal effusion. 2. Reactive bilateral cervical lymphadenopathy.  Barb Electronically Signed   By: Deatra Robinson M.D.   On: 07/21/2017 19:27    Disposition: 01-Home or Self Care  Discharge Instructions    Diet - low sodium heart healthy    Complete by:  As directed    Diet - low sodium heart healthy    Complete by:  As directed    Discharge instructions    Complete by:  As directed    Tonsillectomy Care After Refer to this sheet in the next few weeks. These instructions provide you with information on caring for yourself after your procedure. Your caregiver may also give you specific instructions. Your treatment has been planned according to current medical practices, but problems  sometimes occur. Call your caregiver if you have any problems or questions after your procedure. HOME CARE INSTRUCTIONS  Obtain proper rest, keeping your head elevated at all times. You will feel worn out and tired for a while.  Drink plenty of fluids. This reduces pain and hastens the healing process.  Only take over-the-counter or prescription medicines for pain, discomfort, or fever as directed by your caregiver. Do not take aspirin or nonsteroidal anti-inflammatory drugs. These medications increase the possibility of bleeding.  Sometimes the use of pain medication can cause constipation. If this happens, ask your caregiver about laxatives that you can take.  When eating, only eat a small portion of your food and then take your prescribed pain medication. Eat the remainder of your food 45 minutes later. This will make swallowing less painful.  Soft and cold foods, such as gelatin, sherbet, ice cream, frozen ice pops, and cold drinks, are usually  the easiest to eat. Several days after surgery, you will be able to eat more solid food.  Avoid mouth washes and gargles.  Avoid contact with people who have upper respiratory infections, such as colds and sore throats.  An ice pack applied to your neck may help with discomfort and keep swelling down.  SEEK MEDICAL CARE IF:  You have increasing pain that is not controlled with medications.  You have an oral temperature above 102 F (38.9 C).  You feel lightheaded or have a fainting spell.  You develop a rash.  SEEK IMMEDIATE MEDICAL CARE IF:  You have difficulty breathing.  You experience side effects or allergic reactions to medications.  You bleed bright red blood from your throat, or you vomit bright red blood.  MAKE SURE YOU: Understand these instructions.  Will watch your condition.  Will get help right away if you are not doing well or get worse.   Alternate ibuprofen and oral narcotic pain medication every 6 hours as needed for pain  management.  CALL [phone] for any questions or Emergency concerns.   Increase activity slowly    Complete by:  As directed    Increase activity slowly    Complete by:  As directed       Follow-up Information    Christia Reading, MD. Schedule an appointment as soon as possible for a visit in 1 month.   Specialty:  Otolaryngology Contact information: [address] [place] [address] [phone]            Signed: Osborn Coho 07/24/2017, 9:20 AM

## 2017-07-24 NOTE — Progress Notes (Signed)
Pt for discharge going home, health teachings, prescription meds, next appt.,given all his personal belongings, removed the peripheral IV line, given pain meds prior to discharge, no s/s of distress noted, family at the bedside.

## 2017-08-12 ENCOUNTER — Other Ambulatory Visit: Payer: Self-pay | Admitting: Internal Medicine

## 2017-08-18 MED FILL — BIKTARVY 50-200-25 MG TABS: 50-200-25 | 30 days supply | Qty: 30 | Fill #0

## 2017-09-09 MED FILL — BIKTARVY 50-200-25 MG TABS: 50-200-25 | 30 days supply | Qty: 30 | Fill #1

## 2017-10-24 ENCOUNTER — Emergency Department (HOSPITAL_COMMUNITY)
Admission: EM | Admit: 2017-10-24 | Discharge: 2017-10-24 | Disposition: A | Discharge status: Home / Self Care | Payer: Self-pay | Attending: Emergency Medicine | Admitting: Emergency Medicine

## 2017-10-24 ENCOUNTER — Encounter (HOSPITAL_COMMUNITY): Payer: Self-pay | Admitting: Emergency Medicine

## 2017-10-24 ENCOUNTER — Other Ambulatory Visit: Payer: Self-pay

## 2017-10-24 DIAGNOSIS — F909 Attention-deficit hyperactivity disorder, unspecified type: Secondary | ICD-10-CM | POA: Insufficient documentation

## 2017-10-24 DIAGNOSIS — F17210 Nicotine dependence, cigarettes, uncomplicated: Secondary | ICD-10-CM | POA: Insufficient documentation

## 2017-10-24 DIAGNOSIS — Z79899 Other long term (current) drug therapy: Secondary | ICD-10-CM | POA: Insufficient documentation

## 2017-10-24 DIAGNOSIS — Z202 Contact with and (suspected) exposure to infections with a predominantly sexual mode of transmission: Secondary | ICD-10-CM | POA: Insufficient documentation

## 2017-10-24 MED ORDER — CEFTRIAXONE SODIUM 250 MG IJ SOLR
250.0000 mg | Freq: Once | INTRAMUSCULAR | Status: AC
  Administered 2017-10-24: 250 mg via INTRAMUSCULAR
  Filled 2017-10-24: qty 250

## 2017-10-24 MED ORDER — AZITHROMYCIN 250 MG PO TABS
1000.0000 mg | ORAL_TABLET | Freq: Once | ORAL | Status: AC
  Administered 2017-10-24: 1000 mg via ORAL
  Filled 2017-10-24: qty 4

## 2017-10-24 MED ORDER — STERILE WATER FOR INJECTION IJ SOLN
INTRAMUSCULAR | Status: AC
  Administered 2017-10-24: 15:00:00
  Filled 2017-10-24: qty 10

## 2017-10-24 NOTE — Discharge Instructions (Signed)
Please read and follow all provided instructions.  Today you were cultured for a sexually transmitted disease like chlamydia or gonorrhea. You have elected to be treated at this time as a precaution . Results of your gonorrhea and chlamydia tests are pending and you will be notified if they are positive. Please refrain from sexual activity for 48 hours  It is very important to practice safe sex and use condoms when sexually active. If the test is positive, please refrain from sexual activity for 10 days for the medicine to take effect and notify your sexual partners for the last 6 months as they, too, may want to be tested. The website https://garcia.net/http://www.dontspreadit.com/ can be used to send anonymous text messages or emails to alert sexual contacts.  Because you have had unprotected sex, you may want to consider getting tested for HIV (and Syphilis) as well. If you did not elect to be tested for HIV or Syphilis (blood tests) in the department today, you can follow up at the Health Department for further testing for this. Remember that latex condoms or abstinence are the only way to prevent against STDs or HIV.  If you should develop severe or worsening pain in your abdomen or the pelvis or develop severe fevers, nausea or vomiting that prevent you from taking your medications, return to the emergency department immediately. Otherwise contact your local physician or county health department for a follow up appointment to complete STD testing including HIV and syphilis. I have attached the information for the health department.   Gonorrhea and Chlamydia SYMPTOMS  In males, symptoms include:  -Burning with urination.  -Pain in the testicles.  -Watery mucous-like discharge from the penis.  It can cause longstanding (chronic) pelvic pain after frequent infections.  TREATMENT  -You have elected to be treated at this time as a precaution . Return if you develop an itchy rash, swelling in your mouth or lips, or  difficulty breathing.  -This is a sexually transmitted infection. So you are also at risk for other sexually transmitted diseases, including HIV (AIDS), it is recommended that you get tested. HOME CARE INSTRUCTIONS  Warning: This infection is contagious. Do not have sex until treatment is completed. Follow up at your caregiver's office or the clinic to which you were referred. If your diagnosis (learning what is wrong) is confirmed by culture or some other method, your recent sexual contacts need treatment. Even if they are symptom free or have a negative culture or evaluation, they should be treated.  PREVENTION  Practice safe sex, use condoms, have only one sex partner and be sure your sex partner is not having sex with others.  Ask your caregiver to test you for chlamydia at your regular checkups or sooner if you are having symptoms.  Ask for further information if you are pregnant.  SEEK IMMEDIATE MEDICAL CARE IF:  Please return to the Emergency Department if you do not get better, if you get worse, or new symptoms OR You develop an oral temperature above 102 F (38.9 C), not controlled by medications or lasting more than 2 days.  You develop an increase in pain.  You develop any type of abnormal discharge.  You develop vaginal bleeding and it is not time for your period.  You develop painful intercourse.  Please return if you have any other emergent concerns Additional Information:  Your vital signs today were: BP 128/86 (BP Location: Right Arm)    Pulse 75    Temp 98.6 F (37  C) (Oral)    Resp 15    Ht 5\' 6"  (1.676 m)    Wt 70.3 kg (155 lb)    SpO2 96%    BMI 25.02 kg/m  If your blood pressure (BP) was elevated above 135/85 this visit, please have this repeated by your doctor within one month. ---------------

## 2017-10-24 NOTE — ED Triage Notes (Signed)
Patient requesting STD check. Denies any discharge, itching, or pain.

## 2017-10-24 NOTE — ED Provider Notes (Signed)
[facility] EMERGENCY DEPARTMENT Provider Note   CSN: [id number] Arrival date & time: 10/24/17  1407     History   Chief Complaint Chief Complaint  Patient presents with  . SEXUALLY TRANSMITTED DISEASE    HPI Tonie GriffithDevonte R Larita FifeLynn is a 22 y.o. male with a history of HIV (followed by Dr. Arlester MarkerPham of ID with last CD4 490 on 01/20/17) who presents the emergency department today for exposure to STD.  Patient states that he is sexually active with a male partner who recently was diagnosed with chlamydia.  He states that he is the receiving partner and on one occasion did not use protection.  He is not sexually active with any other partners currently.  He states he has a significant history of STDs including gonorrhea, chlamydia, HIV and latent syphilis.  Patient has not been seen or taken anything for symptoms.  He denies any fever, chills, abdominal pain, painful bowel movements, penile pain, penile discharge, rectal pain, testicular pain/swelling, scrotal pain/swelling, rashes/lesions/ulcerations or urinary symptoms.   HPI  Past Medical History:  Diagnosis Date  . ADHD (attention deficit hyperactivity disorder)   . HIV disease (HCC)   . Hx of seasonal allergies   . No pertinent past medical history   . Recurrent tonsillitis   . Wisdom teeth extracted     Patient Active Problem List   Diagnosis Date Noted  . Peritonsillar abscess 07/23/2017  . Homelessness 02/02/2017  . 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 Depression 09/10/2015  . HIV disease (HCC) 08/19/2015  . 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 Cigarette smoker 08/19/2015    Past Surgical History:  Procedure Laterality Date  . IRRIGATION AND DEBRIDEMENT ABSCESS  09/28/2012   Procedure: IRRIGATION AND DEBRIDEMENT ABSCESS;  Surgeon: Flo ShanksKarol Wolicki, MD;  [facility];  Service: ENT;  Laterality: Right;  Right Peritonsillar Abscess  . MOUTH SURGERY    . TONSILLECTOMY    . TONSILLECTOMY N/A 07/23/2017   Procedure: TONSILLECTOMY;  Surgeon:  Christia ReadingBates, Dwight, MD;  [facility];  Service: ENT;  Laterality: N/A;  . WISDOM TOOTH EXTRACTION         Home Medications    Prior to Admission medications   Medication Sig Start Date End Date Taking? Authorizing Provider  BIKTARVY 50-200-25 MG TABS tablet TAKE 1 TABLET BY MOUTH DAILY. 08/12/17   Cliffton Astersampbell, John, MD  oxyCODONE-acetaminophen (PERCOCET/ROXICET) 5-325 MG tablet Take 1 tablet by mouth every 4 (four) hours as needed for severe pain. 07/21/17   Margarita Grizzleay, Danielle, MD  oxyCODONE-acetaminophen (ROXICET) [phone] MG/5ML solution Take 5-10 mLs by mouth every 6 (six) hours as needed for severe pain. 07/23/17   Christia ReadingBates, Dwight, MD    Family History Family History  Problem Relation Age of Onset  . Diabetes Mother   . Asthma Father     Social History Social History   Tobacco Use  . Smoking status: Light Tobacco Smoker    Packs/day: 0.40    Types: Cigarettes  . Smokeless tobacco: Never Used  Substance Use Topics  . Alcohol use: No    Alcohol/week: 0.0 oz  . Drug use: Yes    Frequency: 7.0 times per week    Types: Marijuana     Allergies   Patient has no known allergies.   Review of Systems Review of Systems  All other systems reviewed and are negative.    Physical Exam Updated Vital Signs BP 128/86 (BP Location: Right Arm)   Pulse 75   Temp 98.6 F (37 C) (Oral)  Resp 15   Ht 5\' 6"  (1.676 m)   Wt 70.3 kg (155 lb)   SpO2 96%   BMI 25.02 kg/m   Physical Exam  Constitutional: He appears well-developed and well-nourished.  HENT:  Head: Normocephalic and atraumatic.  Right Ear: External ear normal.  Left Ear: External ear normal.  Eyes: Conjunctivae are normal. Right eye exhibits no discharge. Left eye exhibits no discharge. No scleral icterus.  Pulmonary/Chest: Effort normal. No respiratory distress.  Abdominal: Normal appearance and bowel sounds are normal. He exhibits no distension. There is no tenderness. There is no rigidity, no rebound, no guarding and  no CVA tenderness.  Genitourinary: Rectum normal, testes normal and penis normal. Rectal exam shows no external hemorrhoid. Cremasteric reflex is present. Right testis shows no mass, no swelling and no tenderness. Left testis shows no mass, no swelling and no tenderness. Circumcised. No phimosis, paraphimosis, penile erythema or penile tenderness. No discharge found.  Genitourinary Comments: Chaperone present.  Normal exam.  Neurological: He is alert.  Speech clear. Follows commands. No facial droop. PERRLA. EOM grossly intact. CN III-XII grossly intact. Grossly moves all extremities 4 without ataxia. Able and appropriate strength for age to upper and lower extremities bilaterally. Normal gait.   Skin: Skin is warm and dry. Capillary refill takes less than 2 seconds. No pallor.  No rash on palms  Psychiatric: He has a normal mood and affect.  Nursing note and vitals reviewed.    ED Treatments / Results  Labs (all labs ordered are listed, but only abnormal results are displayed) Labs Reviewed  RPR  GC/CHLAMYDIA PROBE AMP ([place]) NOT AT [facility]    EKG  EKG Interpretation None       Radiology No results found.  Procedures Procedures (including critical care time)  Medications Ordered in ED Medications  azithromycin (ZITHROMAX) tablet 1,000 mg (1,000 mg Oral Given 10/24/17 1515)  cefTRIAXone (ROCEPHIN) injection 250 mg (250 mg Intramuscular Given 10/24/17 1515)  sterile water (preservative free) injection (  Given 10/24/17 1515)     Initial Impression / Assessment and Plan / ED Course  I have reviewed the triage vital signs and the nursing notes.  Pertinent labs & imaging results that were available during my care of the patient were reviewed by me and considered in my medical decision making (see chart for details).     22 y.o. male with a history of HIV (followed by Dr. Arlester Marker of ID with last CD4 490 on 01/20/17) presenting after recent exposure to poison with known  chlamydia.  Patient states that he is with one male partner and is the receiving partner only. On one occasion he did not use protection recently. He is requesting testing for syphilis, gonorrhea and chlamydia.   Patient is afebrile without abdominal tenderness, abdominal pain or painful bowel movements to indicate prostatitis.  No tenderness to palpation of the testes or epididymis to suggest orchitis or epididymitis. Normal rectal exam.  STD cultures obtained including syphilis, gonorrhea and chlamydia. Patient to be discharged with instructions to follow up with his infectious disease doctor. Discussed importance of using protection when sexually active. Pt understands that they have GC/Chlamydia cultures pending and that they will need to inform all sexual partners if results return positive. Patient has been treated prophylactically with azithromycin and Rocephin.    Final Clinical Impressions(s) / ED Diagnoses   Final diagnoses:  STD exposure    ED Discharge Orders    None       Ivon Oelkers,  Elmer Sow, PA-C 10/24/17 1538    Bethann Berkshire, MD 10/24/17 [phone]

## 2017-10-25 LAB — RPR: RPR Ser Ql: REACTIVE — AB

## 2017-10-25 LAB — RPR, QUANT+TP ABS (REFLEX)
Rapid Plasma Reagin, Quant: 1:8 {titer} — ABNORMAL HIGH
TREPONEMA PALLIDUM AB: POSITIVE — AB

## 2017-10-25 LAB — GC/CHLAMYDIA PROBE AMP (~~LOC~~) NOT AT ARMC
CHLAMYDIA, DNA PROBE: NEGATIVE
Neisseria Gonorrhea: NEGATIVE

## 2017-12-07 ENCOUNTER — Other Ambulatory Visit: Payer: Self-pay

## 2017-12-07 ENCOUNTER — Other Ambulatory Visit: Payer: Self-pay | Admitting: *Deleted

## 2017-12-07 ENCOUNTER — Ambulatory Visit: Payer: Self-pay

## 2017-12-07 DIAGNOSIS — B20 Human immunodeficiency virus [HIV] disease: Secondary | ICD-10-CM

## 2017-12-21 ENCOUNTER — Encounter: Payer: Self-pay | Admitting: Internal Medicine

## 2018-03-30 ENCOUNTER — Emergency Department (HOSPITAL_COMMUNITY): Payer: Self-pay

## 2018-03-30 ENCOUNTER — Encounter (HOSPITAL_COMMUNITY): Payer: Self-pay | Admitting: *Deleted

## 2018-03-30 ENCOUNTER — Emergency Department (HOSPITAL_COMMUNITY)
Admission: EM | Admit: 2018-03-30 | Discharge: 2018-03-30 | Disposition: A | Discharge status: Home / Self Care | Payer: Self-pay | Attending: Emergency Medicine | Admitting: Emergency Medicine

## 2018-03-30 DIAGNOSIS — F17210 Nicotine dependence, cigarettes, uncomplicated: Secondary | ICD-10-CM | POA: Insufficient documentation

## 2018-03-30 DIAGNOSIS — F909 Attention-deficit hyperactivity disorder, unspecified type: Secondary | ICD-10-CM | POA: Insufficient documentation

## 2018-03-30 DIAGNOSIS — Y939 Activity, unspecified: Secondary | ICD-10-CM | POA: Insufficient documentation

## 2018-03-30 DIAGNOSIS — Y999 Unspecified external cause status: Secondary | ICD-10-CM | POA: Insufficient documentation

## 2018-03-30 DIAGNOSIS — B20 Human immunodeficiency virus [HIV] disease: Secondary | ICD-10-CM | POA: Insufficient documentation

## 2018-03-30 DIAGNOSIS — Y929 Unspecified place or not applicable: Secondary | ICD-10-CM | POA: Insufficient documentation

## 2018-03-30 DIAGNOSIS — W260XXA Contact with knife, initial encounter: Secondary | ICD-10-CM | POA: Insufficient documentation

## 2018-03-30 DIAGNOSIS — Z23 Encounter for immunization: Secondary | ICD-10-CM | POA: Insufficient documentation

## 2018-03-30 DIAGNOSIS — S61011A Laceration without foreign body of right thumb without damage to nail, initial encounter: Secondary | ICD-10-CM | POA: Insufficient documentation

## 2018-03-30 DIAGNOSIS — Z79899 Other long term (current) drug therapy: Secondary | ICD-10-CM | POA: Insufficient documentation

## 2018-03-30 MED ORDER — TETANUS-DIPHTH-ACELL PERTUSSIS 5-2.5-18.5 LF-MCG/0.5 IM SUSP
0.5000 mL | Freq: Once | INTRAMUSCULAR | Status: AC
  Administered 2018-03-30: 0.5 mL via INTRAMUSCULAR
  Filled 2018-03-30: qty 0.5

## 2018-03-30 MED ORDER — LIDOCAINE HCL (PF) 1 % IJ SOLN
30.0000 mL | Freq: Once | INTRAMUSCULAR | Status: AC
  Administered 2018-03-30: 30 mL
  Filled 2018-03-30: qty 30

## 2018-03-30 MED ORDER — IBUPROFEN 600 MG PO TABS: 600.0000 mg | ORAL_TABLET | Freq: Four times a day (QID) | ORAL | 0 refills | Status: DC | PRN

## 2018-03-30 NOTE — ED Notes (Signed)
Patients visitor jeffrey wanted to leave his number, stated he had to leave and could be reached at [phone] if needed

## 2018-03-30 NOTE — ED Provider Notes (Signed)
[facility] Provider Note   CSN: [id number] Arrival date & time: 03/30/18  0200     History   Chief Complaint Chief Complaint  Patient presents with  . Laceration    HPI Julian Sanchez is a 22 y.o. male.  The history is provided by the patient. No language interpreter was used.  Laceration   The incident occurred 6 to 12 hours ago. The laceration is located on the right hand. The laceration is 2 cm in size. The laceration mechanism was a a clean knife. The pain is at a severity of 6/10. The pain is moderate. The pain has been constant since onset. He reports no foreign bodies present. His tetanus status is out of date.   Pt reports he cut his thumb on a knife.  Pt was cutting fruit and became distracted.  Past Medical History:  Diagnosis Date  . ADHD (attention deficit hyperactivity disorder)   . HIV disease (HCC)   . Hx of seasonal allergies   . No pertinent past medical history   . Recurrent tonsillitis   . Wisdom teeth extracted     Patient Active Problem List   Diagnosis Date Noted  . Peritonsillar abscess 07/23/2017  . Homelessness 02/02/2017  . 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 Depression 09/10/2015  . HIV disease (HCC) 08/19/2015  . 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 Cigarette smoker 08/19/2015    Past Surgical History:  Procedure Laterality Date  . IRRIGATION AND DEBRIDEMENT ABSCESS  09/28/2012   Procedure: IRRIGATION AND DEBRIDEMENT ABSCESS;  Surgeon: Flo ShanksKarol Wolicki, MD;  [facility];  Service: ENT;  Laterality: Right;  Right Peritonsillar Abscess  . MOUTH SURGERY    . TONSILLECTOMY    . TONSILLECTOMY N/A 07/23/2017   Procedure: TONSILLECTOMY;  Surgeon: Christia ReadingBates, Dwight, MD;  [facility];  Service: ENT;  Laterality: N/A;  . WISDOM TOOTH EXTRACTION          Home Medications    Prior to Admission medications   Medication Sig Start Date End Date Taking? Authorizing Provider  BIKTARVY 50-200-25 MG TABS tablet  TAKE 1 TABLET BY MOUTH DAILY. 08/12/17   Cliffton Astersampbell, John, MD  ibuprofen (ADVIL,MOTRIN) 600 MG tablet Take 1 tablet (600 mg total) by mouth every 6 (six) hours as needed. 03/30/18   Elson AreasSofia, Leslie K, PA-C  oxyCODONE-acetaminophen (PERCOCET/ROXICET) 5-325 MG tablet Take 1 tablet by mouth every 4 (four) hours as needed for severe pain. 07/21/17   Margarita Grizzleay, Danielle, MD  oxyCODONE-acetaminophen (ROXICET) [phone] MG/5ML solution Take 5-10 mLs by mouth every 6 (six) hours as needed for severe pain. 07/23/17   Christia ReadingBates, Dwight, MD  cetirizine (ZYRTEC) 10 MG tablet Take 1 tablet (10 mg total) by mouth daily. 01/23/16 01/23/16  Cheri Fowlerose, Kayla, PA-C    Family History Family History  Problem Relation Age of Onset  . Diabetes Mother   . Asthma Father     Social History Social History   Tobacco Use  . Smoking status: Light Tobacco Smoker    Packs/day: 0.40    Types: Cigarettes  . Smokeless tobacco: Never Used  Substance Use Topics  . Alcohol use: No    Alcohol/week: 0.0 oz  . Drug use: Yes    Frequency: 7.0 times per week    Types: Marijuana     Allergies   Patient has no known allergies.   Review of Systems Review of Systems  Skin: Positive for wound.  All other systems reviewed and are negative.  Physical Exam Updated Vital Signs BP 127/80 (BP Location: Left Arm)   Pulse (!) 109   Temp 99.3 F (37.4 C) (Oral)   Resp 14   Ht 6' (1.829 m)   Wt 68 kg (150 lb)   SpO2 100%   BMI 20.34 kg/m   Physical Exam  Constitutional: He appears well-developed and well-nourished.  HENT:  Head: Normocephalic and atraumatic.  Right Ear: External ear normal.  Left Ear: External ear normal.  Eyes: Conjunctivae are normal.  Neck: Neck supple.  Cardiovascular: Normal rate and regular rhythm.  No murmur heard. Pulmonary/Chest: Effort normal and breath sounds normal. No respiratory distress.  Abdominal: Soft. There is no tenderness.  Musculoskeletal: He exhibits no edema.  Neurological: He is alert.    Skin: Skin is warm and dry.  Psychiatric: He has a normal mood and affect.  Nursing note and vitals reviewed.    ED Treatments / Results  Labs (all labs ordered are listed, but only abnormal results are displayed) Labs Reviewed - No data to display  EKG None  Radiology Dg Finger Thumb Right  Result Date: 03/30/2018 CLINICAL DATA:  Right thumb laceration EXAM: RIGHT THUMB 2+V COMPARISON:  None. FINDINGS: Soft tissue detail is slightly limited by overlying bandaging artifact. There soft tissue swelling at the level of the MCP joint. No radiopaque foreign body nor acute osseous abnormality is seen. IMPRESSION: No acute osseous abnormality nor radiopaque foreign body. Soft tissue detail is somewhat limited by overlying bandaging. The site of laceration is radiographically occult Electronically Signed   By: Tollie Eth M.D.   On: 03/30/2018 03:23    Procedures .Marland KitchenLaceration Repair Date/Time: 03/30/2018 8:53 AM Performed by: Elson Areas, PA-C Authorized by: Elson Areas, PA-C   Consent:    Consent obtained:  Verbal   Consent given by:  Patient   Risks discussed:  Infection Anesthesia (see MAR for exact dosages):    Anesthesia method:  None Laceration details:    Location:  Finger   Finger location:  R thumb   Length (cm):  3   Depth (mm):  4 Repair type:    Repair type:  Simple Exploration:    Wound exploration: wound explored through full range of motion     Wound extent: tendon damage     Contaminated: no   Treatment:    Area cleansed with:  Betadine   Amount of cleaning:  Extensive   Irrigation solution:  Sterile saline Skin repair:    Repair method:  Sutures   Suture size:  5-0   Suture technique:  Simple interrupted   Number of sutures:  7 Approximation:    Approximation:  Loose Post-procedure details:    Dressing:  Open (no dressing)   Patient tolerance of procedure:  Tolerated well, no immediate complications   (including critical care  time)  Medications Ordered in ED Medications  Tdap (BOOSTRIX) injection 0.5 mL (has no administration in time range)  lidocaine (PF) (XYLOCAINE) 1 % injection 30 mL (30 mLs Infiltration Given by Other 03/30/18 0730)     Initial Impression / Assessment and Plan / ED Course  I have reviewed the triage vital signs and the nursing notes.  Pertinent labs & imaging results that were available during my care of the patient were reviewed by me and considered in my medical decision making (see chart for details).  Clinical Course as of Mar 31 847  Wed Mar 30, 2018  0644 DG Finger Thumb Right [LS]  Clinical Course User Index [LS] Elson Areas, [place]      Final Clinical Impressions(s) / ED Diagnoses   Final diagnoses:  Laceration of right thumb, foreign body presence unspecified, nail damage status unspecified, initial encounter    ED Discharge Orders        Ordered    ibuprofen (ADVIL,MOTRIN) 600 MG tablet  Every 6 hours PRN     03/30/18 0837    An After Visit Summary was printed and given to the patient.   Elson Areas, [place] 03/30/18 8469    Zadie Rhine, MD 03/31/18 [phone]

## 2018-03-30 NOTE — ED Triage Notes (Signed)
To ED for treatment of right thumb lac. States he was cutting something when the knife slipped and lacerated thumb. States thumb is numb. Bleeding controlled and clean bandage placed in triage to cover injury until seen by provider. Cap refill and sensation wnl.

## 2018-03-30 NOTE — Discharge Instructions (Signed)
Return if any problems.  Suture removal in 8 days at Urgent care

## 2018-04-09 ENCOUNTER — Encounter (HOSPITAL_COMMUNITY): Payer: Self-pay | Admitting: Emergency Medicine

## 2018-04-09 ENCOUNTER — Emergency Department (HOSPITAL_COMMUNITY)
Admission: EM | Admit: 2018-04-09 | Discharge: 2018-04-09 | Disposition: A | Discharge status: Home / Self Care | Payer: Self-pay | Attending: Emergency Medicine | Admitting: Emergency Medicine

## 2018-04-09 DIAGNOSIS — W228XXD Striking against or struck by other objects, subsequent encounter: Secondary | ICD-10-CM | POA: Insufficient documentation

## 2018-04-09 DIAGNOSIS — F17210 Nicotine dependence, cigarettes, uncomplicated: Secondary | ICD-10-CM | POA: Insufficient documentation

## 2018-04-09 DIAGNOSIS — Z79899 Other long term (current) drug therapy: Secondary | ICD-10-CM | POA: Insufficient documentation

## 2018-04-09 DIAGNOSIS — Z4802 Encounter for removal of sutures: Secondary | ICD-10-CM

## 2018-04-09 DIAGNOSIS — S61011D Laceration without foreign body of right thumb without damage to nail, subsequent encounter: Secondary | ICD-10-CM | POA: Insufficient documentation

## 2018-04-09 NOTE — ED Provider Notes (Signed)
[facility] EMERGENCY DEPARTMENT Provider Note   CSN: [id number] Arrival date & time: 04/09/18  0740     History   Chief Complaint Chief Complaint  Patient presents with  . Suture / Staple Removal    HPI Julian GriffithDevonte R Larita FifeLynn is a 22 y.o. male.  Patient presents for suture removal.  Proximal to 10 days ago patient cut his right thumb and had 8 sutures placed.  They have been healing well no fevers redness or drainage.     Past Medical History:  Diagnosis Date  . ADHD (attention deficit hyperactivity disorder)   . HIV disease (HCC)   . Hx of seasonal allergies   . No pertinent past medical history   . Recurrent tonsillitis   . Wisdom teeth extracted     Patient Active Problem List   Diagnosis Date Noted  . Peritonsillar abscess 07/23/2017  . Homelessness 02/02/2017  . 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 Depression 09/10/2015  . HIV disease (HCC) 08/19/2015  . 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 Cigarette smoker 08/19/2015    Past Surgical History:  Procedure Laterality Date  . IRRIGATION AND DEBRIDEMENT ABSCESS  09/28/2012   Procedure: IRRIGATION AND DEBRIDEMENT ABSCESS;  Surgeon: Flo ShanksKarol Wolicki, MD;  [facility];  Service: ENT;  Laterality: Right;  Right Peritonsillar Abscess  . MOUTH SURGERY    . TONSILLECTOMY    . TONSILLECTOMY N/A 07/23/2017   Procedure: TONSILLECTOMY;  Surgeon: Christia ReadingBates, Dwight, MD;  [facility];  Service: ENT;  Laterality: N/A;  . WISDOM TOOTH EXTRACTION          Home Medications    Prior to Admission medications   Medication Sig Start Date End Date Taking? Authorizing Provider  BIKTARVY 50-200-25 MG TABS tablet TAKE 1 TABLET BY MOUTH DAILY. 08/12/17   Cliffton Astersampbell, John, MD  ibuprofen (ADVIL,MOTRIN) 600 MG tablet Take 1 tablet (600 mg total) by mouth every 6 (six) hours as needed. 03/30/18   Elson AreasSofia, Leslie K, PA-C  oxyCODONE-acetaminophen (PERCOCET/ROXICET) 5-325 MG tablet Take 1 tablet by mouth every 4 (four) hours as needed for severe pain.  07/21/17   Margarita Grizzleay, Danielle, MD  oxyCODONE-acetaminophen (ROXICET) [phone] MG/5ML solution Take 5-10 mLs by mouth every 6 (six) hours as needed for severe pain. 07/23/17   Christia ReadingBates, Dwight, MD    Family History Family History  Problem Relation Age of Onset  . Diabetes Mother   . Asthma Father     Social History Social History   Tobacco Use  . Smoking status: Light Tobacco Smoker    Packs/day: 0.40    Types: Cigarettes  . Smokeless tobacco: Never Used  Substance Use Topics  . Alcohol use: No    Alcohol/week: 0.0 oz  . Drug use: Yes    Frequency: 7.0 times per week    Types: Marijuana     Allergies   Patient has no known allergies.   Review of Systems Review of Systems  Constitutional: Negative for fever.  Skin: Positive for wound.  Neurological: Negative for weakness and numbness.     Physical Exam Updated Vital Signs BP 108/74 (BP Location: Left Arm)   Pulse 83   Temp 98.4 F (36.9 C) (Oral)   Resp 18   Ht 6' (1.829 m)   Wt 68 kg (150 lb)   SpO2 100%   BMI 20.34 kg/m   Physical Exam  Constitutional: He appears well-nourished.  Cardiovascular: Normal rate.  Musculoskeletal: Normal range of motion. He exhibits no edema, tenderness or deformity.  Neurological: He  is alert.  Skin: Skin is warm. No erythema.  Patient has 8 sutures placed linear laceration right lateral palm/base of thumb.  No external sign of infection.  No gaping after sutures removed.  Nursing note and vitals reviewed.    ED Treatments / Results  Labs (all labs ordered are listed, but only abnormal results are displayed) Labs Reviewed - No data to display  EKG None  Radiology No results found.  Procedures .Suture Removal Date/Time: 04/09/2018 8:28 AM Performed by: Blane Ohara, MD Authorized by: Blane Ohara, MD   Consent:    Consent obtained:  Verbal   Consent given by:  Patient   Risks discussed:  Bleeding, pain and wound separation   Alternatives discussed:  No  treatment Location:    Location:  Upper extremity   Upper extremity location:  Hand   Hand location:  R thumb Procedure details:    Wound appearance:  No signs of infection, good wound healing, clean and moist   Number of sutures removed:  8   Number of staples removed:  0 Post-procedure details:    Patient tolerance of procedure:  Tolerated well, no immediate complications   (including critical care time)  Medications Ordered in ED Medications - No data to display   Initial Impression / Assessment and Plan / ED Course  I have reviewed the triage vital signs and the nursing notes.  Pertinent labs & imaging results that were available during my care of the patient were reviewed by me and considered in my medical decision making (see chart for details).    Patient presents with requiring suture removal.  Sutures removed without significant difficulty, wound soaked to assist in process.  Final Clinical Impressions(s) / ED Diagnoses   Final diagnoses:  Visit for suture removal    ED Discharge Orders    None       Blane Ohara, MD 04/09/18 [phone]

## 2018-04-09 NOTE — ED Triage Notes (Signed)
Pt here for suture removal from right thumb. 

## 2018-04-09 NOTE — Discharge Instructions (Addendum)
Keep wound clean, watch for infections. If you were given medicines take as directed.  If you are on coumadin or contraceptives realize their levels and effectiveness is altered by many different medicines.  If you have any reaction (rash, tongues swelling, other) to the medicines stop taking and see a physician.    If your blood pressure was elevated in the ER make sure you follow up for management with a primary doctor or return for chest pain, shortness of breath or stroke symptoms.  Please follow up as directed and return to the ER or see a physician for new or worsening symptoms.  Thank you. Vitals:   04/09/18 0752 04/09/18 0753  BP: 108/74   Pulse: 83   Resp: 18   Temp: 98.4 F (36.9 C)   TempSrc: Oral   SpO2: 100%   Weight:  68 kg (150 lb)  Height:  6' (1.829 m)

## 2018-06-21 ENCOUNTER — Emergency Department (HOSPITAL_COMMUNITY)
Admission: EM | Admit: 2018-06-21 | Discharge: 2018-06-22 | Disposition: A | Discharge status: Home / Self Care | Payer: Self-pay | Attending: Emergency Medicine | Admitting: Emergency Medicine

## 2018-06-21 ENCOUNTER — Encounter (HOSPITAL_COMMUNITY): Payer: Self-pay | Admitting: Emergency Medicine

## 2018-06-21 ENCOUNTER — Other Ambulatory Visit: Payer: Self-pay

## 2018-06-21 DIAGNOSIS — Z7689 Persons encountering health services in other specified circumstances: Secondary | ICD-10-CM | POA: Insufficient documentation

## 2018-06-21 NOTE — Discharge Instructions (Addendum)
Your vital signs are nonacute.  Your pulse oximetry is 100% on room air.  Your examination is nonacute at this time.  No findings tonight that are concerning for meningitis or any other meningeal illness.  It is important that you see your primary physician or your specialist for evaluation.  Usual mask if you are going to be around anyone who is ill or could have had a compromised immune system.

## 2018-06-21 NOTE — ED Triage Notes (Signed)
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 Pt and roommate unable to state if it was viral or bacterial. Pt denies any sx at this time.

## 2018-06-21 NOTE — ED Provider Notes (Signed)
[facility] EMERGENCY DEPARTMENT Provider Note   CSN: [id number] Arrival date & time: 06/21/18  1903     History   Chief Complaint Chief Complaint  Patient presents with  . Wellness Visit    HPI Julian Sanchez is a 22 y.o. male.  Patient is a 22 year old male who presents to the emergency department to request evaluation for possible meningitis.  The patient states that he was sent to the emergency department by his employer for evaluation of possible meningitis.  He states that his roommate was recently diagnosed with meningitis.  He had a 3-day hospital stay for management of the same.  The patient's roommate does not recall what type of meningitis he had, and he does not recall how he was treated.  The patient states that as soon his his roommate got sick, he went and got mask and has been using mask since that time.  He also says he has been using strict handwashing routine.  The patient denies any symptoms including no unusual back or neck pain.  No chills, nausea vomiting, no rash, no difficulty with breathing, and no confusion.  It is of note that the patient is HIV positive.  The patient does not recall what his CD4 count was recently but does remember that it was not severe.                                                                                                                                                                                                                                                                                          The history is provided by the patient.    Past Medical History:  Diagnosis Date  . ADHD (attention deficit hyperactivity disorder)   . HIV disease (HCC)   . Hx of seasonal allergies   . No pertinent past medical history   . Recurrent tonsillitis   . Wisdom teeth extracted     Patient Active Problem List   Diagnosis Date Noted  . Peritonsillar abscess 07/23/2017  . Homelessness 02/02/2017  . Late latent syphilis  02/02/2017  . Depression 09/10/2015  . HIV disease (HCC) 08/19/2015  .  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 Cigarette smoker 08/19/2015    Past Surgical History:  Procedure Laterality Date  . IRRIGATION AND DEBRIDEMENT ABSCESS  09/28/2012   Procedure: IRRIGATION AND DEBRIDEMENT ABSCESS;  Surgeon: Flo Shanks, MD;  Location: [facility] OR;  Service: ENT;  Laterality: Right;  Right Peritonsillar Abscess  . MOUTH SURGERY    . TONSILLECTOMY    . TONSILLECTOMY N/A 07/23/2017   Procedure: TONSILLECTOMY;  Surgeon: Christia Reading, MD;  Location: [facility] OR;  Service: ENT;  Laterality: N/A;  . WISDOM TOOTH EXTRACTION          Home Medications    Prior to Admission medications   Medication Sig Start Date End Date Taking? Authorizing Provider  BIKTARVY 50-200-25 MG TABS tablet TAKE 1 TABLET BY MOUTH DAILY. 08/12/17   Cliffton Asters, MD  ibuprofen (ADVIL,MOTRIN) 600 MG tablet Take 1 tablet (600 mg total) by mouth every 6 (six) hours as needed. 03/30/18   Elson Areas, PA-C  oxyCODONE-acetaminophen (PERCOCET/ROXICET) 5-325 MG tablet Take 1 tablet by mouth every 4 (four) hours as needed for severe pain. 07/21/17   Margarita Grizzle, MD  oxyCODONE-acetaminophen (ROXICET) [phone] MG/5ML solution Take 5-10 mLs by mouth every 6 (six) hours as needed for severe pain. 07/23/17   Christia Reading, MD    Family History Family History  Problem Relation Age of Onset  . Diabetes Mother   . Asthma Father     Social History Social History   Tobacco Use  . Smoking status: Light Tobacco Smoker    Packs/day: 0.40    Types: Cigarettes  . Smokeless tobacco: Never Used  Substance Use Topics  . Alcohol use: No    Alcohol/week: 0.0 standard drinks  . Drug use: Yes    Frequency: 7.0 times per week    Types: Marijuana     Allergies   Patient has no known allergies.   Review of Systems Review of Systems  Constitutional: Negative for activity change.       All ROS Neg except as noted in HPI  HENT: Negative for  nosebleeds.   Eyes: Negative for photophobia and discharge.  Respiratory: Negative for cough, shortness of breath and wheezing.   Cardiovascular: Negative for chest pain and palpitations.  Gastrointestinal: Negative for abdominal pain and blood in stool.  Genitourinary: Negative for dysuria, frequency and hematuria.  Musculoskeletal: Negative for arthralgias, back pain and neck pain.  Skin: Negative.   Neurological: Negative for dizziness, seizures and speech difficulty.  Psychiatric/Behavioral: Negative for confusion and hallucinations.     Physical Exam Updated Vital Signs BP 116/72 (BP Location: Right Arm)   Pulse 97   Temp 99.3 F (37.4 C) (Oral)   Resp 15   Ht 6' (1.829 m)   Wt 74.8 kg   SpO2 100%   BMI 22.38 kg/m   Physical Exam  Constitutional: He is oriented to person, place, and time. He appears well-developed and well-nourished.  Non-toxic appearance.  HENT:  Head: Normocephalic.  Right Ear: Tympanic membrane and external ear normal.  Left Ear: Tympanic membrane and external ear normal.  Eyes: Pupils are equal, round, and reactive to light. EOM and lids are normal.  Neck: Normal range of motion. Neck supple. Carotid bruit is not present.  Cardiovascular: Normal rate, regular rhythm, normal heart sounds, intact distal pulses and normal pulses.  Pulmonary/Chest: Breath sounds normal. No stridor. No respiratory distress. He has no wheezes. He has no rales.  Abdominal: Soft. Bowel sounds are normal. There is no tenderness. There is  no guarding.  Musculoskeletal: Normal range of motion.  Lymphadenopathy:       Head (right side): No submandibular adenopathy present.       Head (left side): No submandibular adenopathy present.    He has no cervical adenopathy.  Neurological: He is alert and oriented to person, place, and time. He has normal strength. No cranial nerve deficit or sensory deficit.  Skin: Skin is warm and dry. No rash noted.  Psychiatric: He has a normal  mood and affect. His speech is normal.  Nursing note and vitals reviewed.    ED Treatments / Results  Labs (all labs ordered are listed, but only abnormal results are displayed) Labs Reviewed - No data to display  EKG None  Radiology No results found.  Procedures Procedures (including critical care time)  Medications Ordered in ED Medications - No data to display   Initial Impression / Assessment and Plan / ED Course  I have reviewed the triage vital signs and the nursing notes.  Pertinent labs & imaging results that were available during my care of the patient were reviewed by me and considered in my medical decision making (see chart for details).       Final Clinical Impressions(s) / ED Diagnoses MDM Vital signs reviewed.  Patient denies any symptoms related to meningitis.  He has not had any high fever, no chills, no changes in his appetite, no nausea vomiting, no rash, no neck or back pain, no confusion, no unusual sensitivity to light.  With no symptomatology at this time, no further work-up to be considered in the emergency department.  I have asked the patient to see his primary physician, his infectious disease specialist or return to the emergency department if any changes in his condition, problems, or concerns.   Final diagnoses:  Return to work evaluation    ED Discharge Orders    None       Ivery Quale, Cordelia Poche 06/22/18 1118    Loren Racer, MD 06/24/18 1539

## 2018-08-13 ENCOUNTER — Emergency Department (HOSPITAL_COMMUNITY)
Admission: EM | Admit: 2018-08-13 | Discharge: 2018-08-13 | Disposition: A | Discharge status: Home / Self Care | Payer: Self-pay | Attending: Emergency Medicine | Admitting: Emergency Medicine

## 2018-08-13 ENCOUNTER — Emergency Department (HOSPITAL_COMMUNITY): Payer: Self-pay

## 2018-08-13 ENCOUNTER — Other Ambulatory Visit: Payer: Self-pay

## 2018-08-13 ENCOUNTER — Encounter (HOSPITAL_COMMUNITY): Payer: Self-pay

## 2018-08-13 DIAGNOSIS — B20 Human immunodeficiency virus [HIV] disease: Secondary | ICD-10-CM | POA: Insufficient documentation

## 2018-08-13 DIAGNOSIS — F17210 Nicotine dependence, cigarettes, uncomplicated: Secondary | ICD-10-CM | POA: Insufficient documentation

## 2018-08-13 DIAGNOSIS — L0291 Cutaneous abscess, unspecified: Secondary | ICD-10-CM

## 2018-08-13 DIAGNOSIS — L0231 Cutaneous abscess of buttock: Secondary | ICD-10-CM | POA: Insufficient documentation

## 2018-08-13 DIAGNOSIS — Z79899 Other long term (current) drug therapy: Secondary | ICD-10-CM | POA: Insufficient documentation

## 2018-08-13 LAB — COMPREHENSIVE METABOLIC PANEL
ALBUMIN: 4.2 g/dL (ref 3.5–5.0)
ALK PHOS: 59 U/L (ref 38–126)
ALT: 13 U/L (ref 0–44)
AST: 19 U/L (ref 15–41)
Anion gap: 8 (ref 5–15)
BUN: 11 mg/dL (ref 6–20)
CALCIUM: 9.3 mg/dL (ref 8.9–10.3)
CHLORIDE: 102 mmol/L (ref 98–111)
CO2: 25 mmol/L (ref 22–32)
CREATININE: 0.89 mg/dL (ref 0.61–1.24)
GFR calc Af Amer: >60 mL/min (ref >60)
GFR calc non Af Amer: >60 mL/min (ref >60)
Glucose, Bld: 106 mg/dL — ABNORMAL HIGH (ref 70–99)
Potassium: 3.2 mmol/L — ABNORMAL LOW (ref 3.5–5.1)
SODIUM: 135 mmol/L (ref 135–145)
Total Bilirubin: 0.6 mg/dL (ref 0.3–1.2)
Total Protein: 8.8 g/dL — ABNORMAL HIGH (ref 6.5–8.1)

## 2018-08-13 LAB — CBC WITH DIFFERENTIAL/PLATELET
Abs Immature Granulocytes: 0.03 10*3/uL (ref 0.00–0.07)
BASOS PCT: 0 %
Basophils Absolute: 0 10*3/uL (ref 0.0–0.1)
EOS ABS: 0.1 10*3/uL (ref 0.0–0.5)
Eosinophils Relative: 1 %
HCT: 44.5 % (ref 39.0–52.0)
HEMOGLOBIN: 14 g/dL (ref 13.0–17.0)
IMMATURE GRANULOCYTES: 0 %
Lymphocytes Relative: 19 %
Lymphs Abs: 1.9 10*3/uL (ref 0.7–4.0)
MCH: 25.8 pg — ABNORMAL LOW (ref 26.0–34.0)
MCHC: 31.5 g/dL (ref 30.0–36.0)
MCV: 82 fL (ref 80.0–100.0)
MONOS PCT: 7 %
Monocytes Absolute: 0.8 10*3/uL (ref 0.1–1.0)
NEUTROS PCT: 73 %
NRBC: 0 % (ref 0.0–0.2)
Neutro Abs: 7.4 10*3/uL (ref 1.7–7.7)
PLATELETS: 217 10*3/uL (ref 150–400)
RBC: 5.43 MIL/uL (ref 4.22–5.81)
RDW: 11.7 % (ref 11.5–15.5)
WBC: 10.2 10*3/uL (ref 4.0–10.5)

## 2018-08-13 MED ORDER — HYDROMORPHONE HCL 1 MG/ML IJ SOLN
1.0000 mg | Freq: Once | INTRAMUSCULAR | Status: AC
  Administered 2018-08-13: 1 mg via INTRAVENOUS
  Filled 2018-08-13: qty 1

## 2018-08-13 MED ORDER — ONDANSETRON HCL 4 MG/2ML IJ SOLN
4.0000 mg | Freq: Once | INTRAMUSCULAR | Status: AC
  Administered 2018-08-13: 4 mg via INTRAVENOUS
  Filled 2018-08-13: qty 2

## 2018-08-13 MED ORDER — HYDROCODONE-ACETAMINOPHEN 5-325 MG PO TABS: 1.0000 | ORAL_TABLET | Freq: Four times a day (QID) | ORAL | 0 refills | Status: DC | PRN

## 2018-08-13 MED ORDER — MIDAZOLAM HCL 5 MG/5ML IJ SOLN
3.0000 mg | Freq: Once | INTRAMUSCULAR | Status: AC
  Administered 2018-08-13: 3 mg via INTRAVENOUS
  Filled 2018-08-13: qty 5

## 2018-08-13 MED ORDER — MEPERIDINE HCL 50 MG/ML IJ SOLN
INTRAMUSCULAR | Status: AC
  Administered 2018-08-13: 50 mg
  Filled 2018-08-13: qty 1

## 2018-08-13 MED ORDER — MEPERIDINE HCL 50 MG PO TABS: 50.0000 mg | ORAL_TABLET | Freq: Once | ORAL | Status: DC

## 2018-08-13 MED ORDER — SULFAMETHOXAZOLE-TRIMETHOPRIM 800-160 MG PO TABS: 1.0000 | ORAL_TABLET | Freq: Two times a day (BID) | ORAL | 0 refills | Status: AC

## 2018-08-13 MED ORDER — IOPAMIDOL (ISOVUE-300) INJECTION 61%
100.0000 mL | Freq: Once | INTRAVENOUS | Status: AC | PRN
  Administered 2018-08-13: 100 mL via INTRAVENOUS

## 2018-08-13 NOTE — ED Triage Notes (Signed)
Pt reports abscess to rectal area x 3 days. No drainage

## 2018-08-13 NOTE — Discharge Instructions (Addendum)
Take sitz baths as instructed by the nurse.  And follow-up with Dr. Lovell Sheehan if any problems

## 2018-08-13 NOTE — ED Provider Notes (Signed)
[facility] EMERGENCY DEPARTMENT Provider Note   CSN: [id number] Arrival date & time: 08/13/18  7829     History   Chief Complaint Chief Complaint  Patient presents with  . Abscess    HPI Julian Sanchez is a 22 y.o. male.  Patient complains of swelling on his buttocks near his anus.  Tenderness and swelling is on his right buttocks  The history is provided by the patient. No language interpreter was used.  Abscess  Abscess location: Right buttocks. Abscess quality: induration   Red streaking: no   Progression:  Worsening Chronicity:  New Context: not diabetes   Relieved by:  Nothing Worsened by:  Nothing Ineffective treatments:  None tried Associated symptoms: no anorexia, no fatigue and no headaches     Past Medical History:  Diagnosis Date  . ADHD (attention deficit hyperactivity disorder)   . HIV disease (HCC)   . Hx of seasonal allergies   . No pertinent past medical history   . Recurrent tonsillitis   . Wisdom teeth extracted     Patient Active Problem List   Diagnosis Date Noted  . Abscess of buttock, right   . Peritonsillar abscess 07/23/2017  . Homelessness 02/02/2017  . 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 Depression 09/10/2015  . HIV disease (HCC) 08/19/2015  . 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 Cigarette smoker 08/19/2015    Past Surgical History:  Procedure Laterality Date  . IRRIGATION AND DEBRIDEMENT ABSCESS  09/28/2012   Procedure: IRRIGATION AND DEBRIDEMENT ABSCESS;  Surgeon: Flo Shanks, MD;  Location: [facility] OR;  Service: ENT;  Laterality: Right;  Right Peritonsillar Abscess  . MOUTH SURGERY    . TONSILLECTOMY    . TONSILLECTOMY N/A 07/23/2017   Procedure: TONSILLECTOMY;  Surgeon: Christia Reading, MD;  Location: [facility] OR;  Service: ENT;  Laterality: N/A;  . WISDOM TOOTH EXTRACTION          Home Medications    Prior to Admission medications   Medication Sig Start Date End Date Taking? Authorizing Provider  BIKTARVY 50-200-25 MG TABS tablet  TAKE 1 TABLET BY MOUTH DAILY. 08/12/17   Cliffton Asters, MD  HYDROcodone-acetaminophen (NORCO/VICODIN) 5-325 MG tablet Take 1 tablet by mouth every 6 (six) hours as needed for moderate pain. 08/13/18   Bethann Berkshire, MD  ibuprofen (ADVIL,MOTRIN) 600 MG tablet Take 1 tablet (600 mg total) by mouth every 6 (six) hours as needed. 03/30/18   Elson Areas, PA-C  oxyCODONE-acetaminophen (PERCOCET/ROXICET) 5-325 MG tablet Take 1 tablet by mouth every 4 (four) hours as needed for severe pain. 07/21/17   Margarita Grizzle, MD  oxyCODONE-acetaminophen (ROXICET) [phone] MG/5ML solution Take 5-10 mLs by mouth every 6 (six) hours as needed for severe pain. 07/23/17   Christia Reading, MD  sulfamethoxazole-trimethoprim (BACTRIM DS,SEPTRA DS) 800-160 MG tablet Take 1 tablet by mouth 2 (two) times daily for 7 days. 08/13/18 08/20/18  Bethann Berkshire, MD    Family History Family History  Problem Relation Age of Onset  . Diabetes Mother   . Asthma Father     Social History Social History   Tobacco Use  . Smoking status: Light Tobacco Smoker    Packs/day: 0.40    Types: Cigarettes  . Smokeless tobacco: Never Used  Substance Use Topics  . Alcohol use: No    Alcohol/week: 0.0 standard drinks  . Drug use: Yes    Frequency: 7.0 times per week    Types: Marijuana     Allergies   Patient has no known allergies.  Review of Systems Review of Systems  Constitutional: Negative for appetite change and fatigue.  HENT: Negative for congestion, ear discharge and sinus pressure.   Eyes: Negative for discharge.  Respiratory: Negative for cough.   Cardiovascular: Negative for chest pain.  Gastrointestinal: Negative for abdominal pain, anorexia and diarrhea.  Genitourinary: Negative for frequency and hematuria.  Musculoskeletal: Negative for back pain.       Swelling to right buttock  Skin: Negative for rash.  Neurological: Negative for seizures and headaches.  Psychiatric/Behavioral: Negative for hallucinations.       Physical Exam Updated Vital Signs BP 96/64   Pulse 67   Temp 98.5 F (36.9 C) (Oral)   Resp 11   Wt 74.8 kg   SpO2 97%   BMI 22.37 kg/m   Physical Exam  Constitutional: He is oriented to person, place, and time. He appears well-developed.  HENT:  Head: Normocephalic.  Eyes: Conjunctivae and EOM are normal. No scleral icterus.  Neck: Neck supple. No thyromegaly present.  Cardiovascular: Normal rate and regular rhythm. Exam reveals no gallop and no friction rub.  No murmur heard. Pulmonary/Chest: No stridor. He has no wheezes. He has no rales. He exhibits no tenderness.  Abdominal: He exhibits no distension. There is no tenderness. There is no rebound.  Genitourinary:  Genitourinary Comments: Patient has a large perirectal abscess  Musculoskeletal: Normal range of motion. He exhibits no edema.  Lymphadenopathy:    He has no cervical adenopathy.  Neurological: He is oriented to person, place, and time. He exhibits normal muscle tone. Coordination normal.  Skin: No rash noted. No erythema.  Psychiatric: He has a normal mood and affect. His behavior is normal.     ED Treatments / Results  Labs (all labs ordered are listed, but only abnormal results are displayed) Labs Reviewed  CBC WITH DIFFERENTIAL/PLATELET - Abnormal; Notable for the following components:      Result Value   MCH 25.8 (*)    All other components within normal limits  COMPREHENSIVE METABOLIC PANEL - Abnormal; Notable for the following components:   Potassium 3.2 (*)    Glucose, Bld 106 (*)    Total Protein 8.8 (*)    All other components within normal limits  AEROBIC/ANAEROBIC CULTURE (SURGICAL/DEEP WOUND)    EKG None  Radiology Ct Abdomen Pelvis W Contrast  Result Date: 08/13/2018 CLINICAL DATA:  Rectal abscess for 3 days. No drainage. Prior rectal abscess on CT 10/07/2015 EXAM: CT ABDOMEN AND PELVIS WITH CONTRAST TECHNIQUE: Multidetector CT imaging of the abdomen and pelvis was performed  using the standard protocol following bolus administration of intravenous contrast. CONTRAST:  ISOVUE-300 IOPAMIDOL (ISOVUE-300) INJECTION 61% COMPARISON:  CT 10/07/2015 FINDINGS: Lower chest: Lung bases are clear. Hepatobiliary: No focal hepatic lesion. No biliary duct dilatation. Gallbladder is normal. Common bile duct is normal. Pancreas: Pancreas is normal. No ductal dilatation. No pancreatic inflammation. Spleen: Normal spleen Adrenals/urinary tract: Adrenal glands normal. Horseshoe kidney anatomy. Ureters and bladder normal. Stomach/Bowel: Stomach, small bowel, appendix, and cecum are normal. The colon and rectosigmoid colon are normal. Vascular/Lymphatic: Abdominal aorta is normal caliber. No periportal or retroperitoneal adenopathy. No pelvic adenopathy. Mildly prominent inguinal lymph nodes Reproductive: Prostate is normal Other: There is a subcutaneous fluid collection with a thick rim position in the RIGHT medial gluteal fold subcutaneous fat measuring 4.7 x 2.8 cm. 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 Similar abscess was present in the LEFT gluteal fold on CT 10/07/2015. No evidence of extension into ischial  rectal fossa. No evidence of extension along the internal external sphincters at least by CT imaging. There is a small extension across midline at the level of the superior aspect the gluteal fold (image 91/2). Musculoskeletal: No aggressive osseous lesion. IMPRESSION: 1. 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 No evidence of extension above the pelvic floor. 2. Potential mild extension across midline at the superior aspect of the gluteal fold. Electronically Signed   By: Genevive Bi M.D.   On: 08/13/2018 09:27    Procedures Procedures (including critical care time)  Medications Ordered in ED Medications  HYDROmorphone (DILAUDID) injection 1 mg (1 mg Intravenous Given 08/13/18 0833)    ondansetron (ZOFRAN) injection 4 mg (4 mg Intravenous Given 08/13/18 0833)  iopamidol (ISOVUE-300) 61 % injection 100 mL (100 mLs Intravenous Contrast Given 08/13/18 0854)  midazolam (VERSED) 5 MG/5ML injection 3 mg (3 mg Intravenous Given 08/13/18 1027)  meperidine (DEMEROL) 50 MG/ML injection (50 mg  Given 08/13/18 1027)     Initial Impression / Assessment and Plan / ED Course  I have reviewed the triage vital signs and the nursing notes.  Pertinent labs & imaging results that were available during my care of the patient were reviewed by me and considered in my medical decision making (see chart for details).     With perirectal abscess.  Surgery consulted and drained the abscess.  He will be sent home with Bactrim and Vicodin  Final Clinical Impressions(s) / ED Diagnoses   Final diagnoses:  Abscess    ED Discharge Orders         Ordered    sulfamethoxazole-trimethoprim (BACTRIM DS,SEPTRA DS) 800-160 MG tablet  2 times daily     08/13/18 1239    HYDROcodone-acetaminophen (NORCO/VICODIN) 5-325 MG tablet  Every 6 hours PRN     08/13/18 1239           Bethann Berkshire, MD 08/13/18 1249

## 2018-08-13 NOTE — ED Notes (Signed)
Beeped to [phone].Lovell Sheehan

## 2018-08-13 NOTE — Consult Note (Signed)
Reason for Consult: Right buttock abscess Referring Physician: Dr. Roderic Scarce is an 22 y.o. male.  HPI: Patient is a 22 year old HIV-positive black male who presented with a several day history of worsening pain and swelling in the right buttock.  He states he has had a history of a boil in this region which drained spontaneously in the past.  He denies any fever or chills.  He rates the pain as 9 out of 10.  Surgery was consulted given the area of the abscess.  Past Medical History:  Diagnosis Date  . ADHD (attention deficit hyperactivity disorder)   . HIV disease ([place])   . Hx of seasonal allergies   . No pertinent past medical history   . Recurrent tonsillitis   . Wisdom teeth extracted     Past Surgical History:  Procedure Laterality Date  . IRRIGATION AND DEBRIDEMENT ABSCESS  09/28/2012   Procedure: IRRIGATION AND DEBRIDEMENT ABSCESS;  Surgeon: Jodi Marble, MD;  Location: [facility];  Service: ENT;  Laterality: Right;  Right Peritonsillar Abscess  . MOUTH SURGERY    . TONSILLECTOMY    . TONSILLECTOMY N/A 07/23/2017   Procedure: TONSILLECTOMY;  Surgeon: Melida Quitter, MD;  Location: [facility] OR;  Service: ENT;  Laterality: N/A;  . WISDOM TOOTH EXTRACTION      Family History  Problem Relation Age of Onset  . Diabetes Mother   . Asthma Father     Social History:  reports that he has been smoking cigarettes. He has been smoking about 0.40 packs per day. He has never used smokeless tobacco. He reports that he has current or past drug history. Drug: Marijuana. Frequency: 7.00 times per week. He reports that he does not drink alcohol.  Allergies: No Known Allergies  Medications: I have reviewed the patient's current medications.  Results for orders placed or performed during the hospital encounter of 08/13/18 (from the past 48 hour(s))  CBC with Differential/Platelet     Status: Abnormal   Collection Time: 08/13/18  8:15 AM  Result Value Ref Range   WBC 10.2 4.0 - 10.5  K/uL   RBC 5.43 4.22 - 5.81 MIL/uL   Hemoglobin 14.0 13.0 - 17.0 g/dL   HCT 44.5 39.0 - 52.0 %   MCV 82.0 80.0 - 100.0 fL   MCH 25.8 (L) 26.0 - 34.0 pg   MCHC 31.5 30.0 - 36.0 g/dL   RDW 11.7 11.5 - 15.5 %   Platelets 217 150 - 400 K/uL   nRBC 0.0 0.0 - 0.2 %   Neutrophils Relative % 73 %   Neutro Abs 7.4 1.7 - 7.7 K/uL   Lymphocytes Relative 19 %   Lymphs Abs 1.9 0.7 - 4.0 K/uL   Monocytes Relative 7 %   Monocytes Absolute 0.8 0.1 - 1.0 K/uL   Eosinophils Relative 1 %   Eosinophils Absolute 0.1 0.0 - 0.5 K/uL   Basophils Relative 0 %   Basophils Absolute 0.0 0.0 - 0.1 K/uL   Immature Granulocytes 0 %   Abs Immature Granulocytes 0.03 0.00 - 0.07 K/uL    Comment: Performed at [facility], [address] [place], [place] [address]  Comprehensive metabolic panel     Status: Abnormal   Collection Time: 08/13/18  8:15 AM  Result Value Ref Range   Sodium 135 135 - 145 mmol/L   Potassium 3.2 (L) 3.5 - 5.1 mmol/L   Chloride 102 98 - 111 mmol/L   CO2 25 22 - 32 mmol/L  Glucose, Bld 106 (H) 70 - 99 mg/dL   BUN 11 6 - 20 mg/dL   Creatinine, Ser 0.89 0.61 - 1.24 mg/dL   Calcium 9.3 8.9 - 10.3 mg/dL   Total Protein 8.8 (H) 6.5 - 8.1 g/dL   Albumin 4.2 3.5 - 5.0 g/dL   AST 19 15 - 41 U/L   ALT 13 0 - 44 U/L   Alkaline Phosphatase 59 38 - 126 U/L   Total Bilirubin 0.6 0.3 - 1.2 mg/dL   GFR calc non Af Amer >60 >60 mL/min   GFR calc Af Amer >60 >60 mL/min    Comment: (NOTE) The eGFR has been calculated using the CKD EPI equation. This calculation has not been validated in all clinical situations. eGFR's persistently <60 mL/min signify possible Chronic Kidney Disease.    Anion gap 8 5 - 15    Comment: Performed at [facility], [address], [place], [place] [address]    Ct Abdomen Pelvis W Contrast  Result Date: 08/13/2018 CLINICAL DATA:  Rectal abscess for 3 days. No drainage. Prior rectal abscess on CT 10/07/2015 EXAM: CT ABDOMEN AND PELVIS WITH CONTRAST TECHNIQUE:  Multidetector CT imaging of the abdomen and pelvis was performed using the standard protocol following bolus administration of intravenous contrast. CONTRAST:  131m ISOVUE-300 IOPAMIDOL (ISOVUE-300) INJECTION 61% COMPARISON:  CT 10/07/2015 FINDINGS: Lower chest: Lung bases are clear. Hepatobiliary: No focal hepatic lesion. No biliary duct dilatation. Gallbladder is normal. Common bile duct is normal. Pancreas: Pancreas is normal. No ductal dilatation. No pancreatic inflammation. Spleen: Normal spleen Adrenals/urinary tract: Adrenal glands normal. Horseshoe kidney anatomy. Ureters and bladder normal. Stomach/Bowel: Stomach, small bowel, appendix, and cecum are normal. The colon and rectosigmoid colon are normal. Vascular/Lymphatic: Abdominal aorta is normal caliber. No periportal or retroperitoneal adenopathy. No pelvic adenopathy. Mildly prominent inguinal lymph nodes Reproductive: Prostate is normal Other: There is a subcutaneous fluid collection with a thick rim position in the RIGHT medial gluteal fold subcutaneous fat measuring 4.7 x 2.8 cm. 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 Similar abscess was present in the LEFT gluteal fold on CT 10/07/2015. No evidence of extension into ischial rectal fossa. No evidence of extension along the internal external sphincters at least by CT imaging. There is a small extension across midline at the level of the superior aspect the gluteal fold (image 91/2). Musculoskeletal: No aggressive osseous lesion. IMPRESSION: 1. 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 No evidence of extension above the pelvic floor. 2. Potential mild extension across midline at the superior aspect of the gluteal fold. Electronically Signed   By: SSuzy BouchardM.D.   On: 08/13/2018 09:27    ROS:  Pertinent items are noted in HPI.  Blood pressure 109/65, pulse 77, temperature 98.5 F (36.9 C), temperature  source Oral, resp. rate (!) 26, weight 74.8 kg, SpO2 98 %. Physical Exam: Pleasant black male in no acute distress. Buttock: Fluctuant tender abscess in the right buttock region, rectal examination limited secondary to pain.  Procedure note: Informed consent was obtained from the patient.  Using monitored anesthesia care technique, Demerol 50 mg IV, Versed 3 mg IV was given.  Surgical site confirmation was performed.  Topical freeze was used over the abscess.  The abscess was prepped with Betadine.  An 11 blade was used to perform incision and drainage of the abscess.  A culture was obtained of the abscess fluid.  The abscess was fully  evacuated.  A dry sterile dressing was applied.  The patient tolerated the procedure well.  Assessment/Plan: Impression: Right buttock abscess, stable status post I&D. Plan: Would send patient home on Bactrim.  May do sitz baths to take care of the wound.  Follow-up in my office in a week as needed.  Aviva Signs 08/13/2018, 10:40 AM

## 2018-08-15 LAB — AEROBIC/ANAEROBIC CULTURE W GRAM STAIN (SURGICAL/DEEP WOUND)

## 2018-08-16 ENCOUNTER — Telehealth: Payer: Self-pay | Admitting: Emergency Medicine

## 2018-08-16 NOTE — Progress Notes (Signed)
ED Antimicrobial Stewardship Positive Culture Follow Up   Julian Sanchez is an 22 y.o. male who presented to [facility] on 08/13/2018 with a chief complaint of  Chief Complaint  Patient presents with  . Abscess    Recent Results (from the past 720 hour(s))  Aerobic/Anaerobic Culture (surgical/deep wound)     Status: None   Collection Time: 08/13/18 10:33 AM  Result Value Ref Range Status   Specimen Description   Final    BUTTOCKS Performed at [facility], [address] [place], [place] [address]    Special Requests   Final    Immunocompromised Performed at [facility], [address], [place], [place] [address]    Gram Stain   Final    RARE WBC PRESENT, PREDOMINANTLY PMN MODERATE GRAM POSITIVE COCCI RARE GRAM NEGATIVE RODS Performed at [facility], [address], [place], [place] [address]    Culture ABUNDANT PEPTOSTREPTOCOCCUS ANAEROBIUS  Final   Report Status 08/15/2018 FINAL  Final    [x]  Treated with Bactrim, organism resistant to prescribed antimicrobial  New antibiotic prescription: Augmentin 875mg  PO BID for 7 days  ED Provider: Dietrich Pates, PA-C  Thank you for allowing pharmacy to be a part of this patient's care.  Bradley Ferris, PharmD 08/16/2018 9:39 AM PGY-1 Pharmacy Resident Monday - Friday phone -  [phone] Saturday - Sunday phone - [phone]

## 2018-08-16 NOTE — Telephone Encounter (Signed)
Post ED Visit - Positive Culture Follow-up: Successful Patient Follow-Up  Culture assessed and recommendations reviewed by:  []  Enzo Bi, Pharm.D. []  Celedonio Miyamoto, Pharm.D., BCPS AQ-ID []  Garvin Fila, Pharm.D., BCPS []  Georgina Pillion, Pharm.D., BCPS []  [place], [address].D., BCPS, AAHIVP []  Estella Husk, Pharm.D., BCPS, AAHIVP []  Lysle Pearl, PharmD, BCPS []  Phillips Climes, PharmD, BCPS []  Agapito Games, PharmD, BCPS []  Verlan Friends, PharmD J. Tessin PharmD  Positive wound culture  []  Patient discharged without antimicrobial prescription and treatment is now indicated [x]  Organism is resistant to prescribed ED discharge antimicrobial []  Patient with positive blood cultures  Changes discussed with ED provider: Dietrich Pates PA New antibiotic prescription stop bactrim , start augmentin 875mg  po bdid x 7 days  attemptng to contact patient   Mackey, [place] 08/16/2018, 2:03 PM

## 2019-02-28 ENCOUNTER — Other Ambulatory Visit: Payer: Self-pay | Admitting: *Deleted

## 2019-02-28 ENCOUNTER — Ambulatory Visit: Payer: Self-pay

## 2019-02-28 ENCOUNTER — Other Ambulatory Visit: Payer: Self-pay

## 2019-02-28 ENCOUNTER — Encounter: Payer: Self-pay | Admitting: Internal Medicine

## 2019-02-28 DIAGNOSIS — Z113 Encounter for screening for infections with a predominantly sexual mode of transmission: Secondary | ICD-10-CM

## 2019-02-28 DIAGNOSIS — B20 Human immunodeficiency virus [HIV] disease: Secondary | ICD-10-CM

## 2019-02-28 DIAGNOSIS — Z79899 Other long term (current) drug therapy: Secondary | ICD-10-CM

## 2019-03-01 LAB — T-HELPER CELL (CD4) - (RCID CLINIC ONLY)
CD4 % Helper T Cell: 20 % — ABNORMAL LOW (ref 33–65)
CD4 T Cell Abs: 318 /uL — ABNORMAL LOW (ref 400–1790)

## 2019-03-04 LAB — CBC WITH DIFFERENTIAL/PLATELET
Absolute Monocytes: 478 cells/uL (ref 200–950)
Basophils Absolute: 31 cells/uL (ref 0–200)
Basophils Relative: 0.6 %
Eosinophils Absolute: 172 cells/uL (ref 15–500)
Eosinophils Relative: 3.3 %
HCT: 46.8 % (ref 38.5–50.0)
Hemoglobin: 16 g/dL (ref 13.2–17.1)
Lymphs Abs: 1856 cells/uL (ref 850–3900)
MCH: 27.6 pg (ref 27.0–33.0)
MCHC: 34.2 g/dL (ref 32.0–36.0)
MCV: 80.7 fL (ref 80.0–100.0)
MPV: 10.2 fL (ref 7.5–12.5)
Monocytes Relative: 9.2 %
Neutro Abs: 2662 cells/uL (ref 1500–7800)
Neutrophils Relative %: 51.2 %
Platelets: 242 10*3/uL (ref 140–400)
RBC: 5.8 10*6/uL (ref 4.20–5.80)
RDW: 12.5 % (ref 11.0–15.0)
Total Lymphocyte: 35.7 %
WBC: 5.2 10*3/uL (ref 3.8–10.8)

## 2019-03-04 LAB — COMPLETE METABOLIC PANEL WITH GFR
AG Ratio: 1.1 (calc) (ref 1.0–2.5)
ALT: 9 U/L (ref 9–46)
AST: 17 U/L (ref 10–40)
Albumin: 4.5 g/dL (ref 3.6–5.1)
Alkaline phosphatase (APISO): 63 U/L (ref 36–130)
BUN: 10 mg/dL (ref 7–25)
CO2: 25 mmol/L (ref 20–32)
Calcium: 9.9 mg/dL (ref 8.6–10.3)
Chloride: 101 mmol/L (ref 98–110)
Creat: 0.99 mg/dL (ref 0.60–1.35)
GFR, Est African American: 122 mL/min/{1.73_m2} (ref >=60)
GFR, Est Non African American: 105 mL/min/{1.73_m2} (ref >=60)
Globulin: 4.2 g/dL (calc) — ABNORMAL HIGH (ref 1.9–3.7)
Glucose, Bld: 81 mg/dL (ref 65–99)
Potassium: 4.3 mmol/L (ref 3.5–5.3)
Sodium: 136 mmol/L (ref 135–146)
Total Bilirubin: 0.5 mg/dL (ref 0.2–1.2)
Total Protein: 8.7 g/dL — ABNORMAL HIGH (ref 6.1–8.1)

## 2019-03-04 LAB — RPR TITER: RPR Titer: 1:4 {titer} — ABNORMAL HIGH

## 2019-03-04 LAB — LIPID PANEL
Cholesterol: 125 mg/dL (ref <200)
HDL: 38 mg/dL — ABNORMAL LOW (ref >=40)
LDL Cholesterol (Calc): 73 mg/dL (calc)
Non-HDL Cholesterol (Calc): 87 mg/dL (calc) (ref <130)
Total CHOL/HDL Ratio: 3.3 (calc) (ref <5.0)
Triglycerides: 55 mg/dL (ref <150)

## 2019-03-04 LAB — HIV-1 RNA QUANT-NO REFLEX-BLD
HIV 1 RNA Quant: 15400 copies/mL — ABNORMAL HIGH
HIV-1 RNA Quant, Log: 4.19 Log copies/mL — ABNORMAL HIGH

## 2019-03-04 LAB — FLUORESCENT TREPONEMAL AB(FTA)-IGG-BLD: Fluorescent Treponemal ABS: REACTIVE — AB

## 2019-03-04 LAB — RPR: RPR Ser Ql: REACTIVE — AB

## 2019-03-22 ENCOUNTER — Ambulatory Visit (INDEPENDENT_AMBULATORY_CARE_PROVIDER_SITE_OTHER): Payer: Self-pay | Admitting: Internal Medicine

## 2019-03-22 ENCOUNTER — Other Ambulatory Visit: Payer: Self-pay

## 2019-03-22 ENCOUNTER — Encounter: Payer: Self-pay | Admitting: Internal Medicine

## 2019-03-22 DIAGNOSIS — F331 Major depressive disorder, recurrent, moderate: Secondary | ICD-10-CM

## 2019-03-22 DIAGNOSIS — A528 Late syphilis, latent: Secondary | ICD-10-CM

## 2019-03-22 DIAGNOSIS — B20 Human immunodeficiency virus [HIV] disease: Secondary | ICD-10-CM

## 2019-03-22 DIAGNOSIS — F17210 Nicotine dependence, cigarettes, uncomplicated: Secondary | ICD-10-CM

## 2019-03-22 MED ORDER — BICTEGRAVIR-EMTRICITAB-TENOFOV 50-200-25 MG PO TABS: 1.0000 | ORAL_TABLET | Freq: Every day | ORAL | 11 refills | Status: DC

## 2019-03-22 NOTE — Assessment & Plan Note (Signed)
He will start back on Biktarvy today and follow-up after lab work in 6 weeks.

## 2019-03-22 NOTE — Addendum Note (Signed)
Addended by: Landis Gandy on: 03/22/2019 10:24 AM   Modules accepted: Orders

## 2019-03-22 NOTE — Assessment & Plan Note (Signed)
His RPR continues to decline slowly after treatment for syphilis several years ago.

## 2019-03-22 NOTE — Progress Notes (Signed)
Patient Active Problem List   Diagnosis Date Noted  . HIV disease (HCC) 08/19/2015    Priority: High  . Abscess of buttock, right   . Peritonsillar abscess 07/23/2017  . Homelessness 02/02/2017  . 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 Depression 09/10/2015  . 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 Cigarette smoker 08/19/2015    Patient's Medications  New Prescriptions   No medications on file  Previous Medications   IBUPROFEN (ADVIL,MOTRIN) 600 MG TABLET    Take 1 tablet (600 mg total) by mouth every 6 (six) hours as needed.   OXYCODONE-ACETAMINOPHEN (PERCOCET/ROXICET) 5-325 MG TABLET    Take 1 tablet by mouth every 4 (four) hours as needed for severe pain.   OXYCODONE-ACETAMINOPHEN (ROXICET) 5-325 MG/5ML SOLUTION    Take 5-10 mLs by mouth every 6 (six) hours as needed for severe pain.  Modified Medications   Modified Medication Previous Medication   BICTEGRAVIR-EMTRICITABINE-TENOFOVIR AF (BIKTARVY) 50-200-25 MG TABS TABLET BIKTARVY 50-200-25 MG TABS tablet      Take 1 tablet by mouth daily.    TAKE 1 TABLET BY MOUTH DAILY.  Discontinued Medications   HYDROCODONE-ACETAMINOPHEN (NORCO/VICODIN) 5-325 MG TABLET    Take 1 tablet by mouth every 6 (six) hours as needed for moderate pain.    Subjective: Julian Sanchez is in for his HIV follow-up visit.  He was last seen here in April 2018.  [place] [facility] [place] He took his Biktarvy on a daily basis until his prescription ran out last year.  He was forced to move back home to [place] by the COVID pandemic.  He is living with his mother.  He is working at an [employer] station [place] in [place].  He is not on any medication currently.  His chronic depression is largely unchanged.  He says that when he is not at work he is generally in bed at home.  He has not been in a relationship and has not been sexually active.  He is smoking about 3 packs of cigarettes daily and has no desire to quit.  Review of Systems: Review of Systems   Constitutional: Negative for chills, diaphoresis, fever, malaise/fatigue and weight loss.  HENT: Negative for sore throat.   Respiratory: Negative for cough, sputum production and shortness of breath.   Cardiovascular: Negative for chest pain.  Gastrointestinal: Negative for abdominal pain, diarrhea, heartburn, nausea and vomiting.  Genitourinary: Negative for dysuria and frequency.  Musculoskeletal: Negative for joint pain and myalgias.  Skin: Negative for rash.  Neurological: Negative for dizziness and headaches.  Psychiatric/Behavioral: Positive for depression. Negative for substance abuse and suicidal ideas. The patient is not nervous/anxious and does not have insomnia.     Past Medical History:  Diagnosis Date  . ADHD (attention deficit hyperactivity disorder)   . HIV disease (HCC)   . Hx of seasonal allergies   . No pertinent past medical history   . Recurrent tonsillitis   . Wisdom teeth extracted     Social History   Tobacco Use  . Smoking status: Light Tobacco Smoker    Packs/day: 0.40    Types: Cigarettes  . Smokeless tobacco: Never Used  Substance Use Topics  . Alcohol use: No    Alcohol/week: 0.0 standard drinks  . Drug use: Yes    Frequency: 7.0 times per week    Types: Marijuana    Family History  Problem Relation Age of Onset  . Diabetes Mother   . Asthma Father  No Known Allergies  Health Maintenance  Topic Date Due  . INFLUENZA VACCINE  05/13/2019  . TETANUS/TDAP  03/30/2028  . HIV Screening  Completed    Objective:  Vitals:   03/22/19 0918  Weight: 150 lb (68 kg)  Height: 6' (1.829 m)   Body mass index is 20.34 kg/m.  Physical Exam Constitutional:      Comments: He has lost 15 pounds over the past year.  HENT:     Mouth/Throat:     Pharynx: No oropharyngeal exudate.  Eyes:     Conjunctiva/sclera: Conjunctivae normal.  Cardiovascular:     Rate and Rhythm: Normal rate and regular rhythm.     Heart sounds: No murmur.   Pulmonary:     Effort: Pulmonary effort is normal.     Breath sounds: Normal breath sounds.  Abdominal:     Palpations: Abdomen is soft. There is no mass.     Tenderness: There is no abdominal tenderness.  Musculoskeletal: Normal range of motion.  Skin:    Findings: No rash.  Neurological:     Mental Status: He is alert and oriented to person, place, and time.  Psychiatric:     Comments: He is in reasonably good spirits but seems to be rather nonchalant with a flat affect when talking about his depression.     Lab Results Lab Results  Component Value Date   WBC 5.2 02/28/2019   HGB 16.0 02/28/2019   HCT 46.8 02/28/2019   MCV 80.7 02/28/2019   PLT 242 02/28/2019    Lab Results  Component Value Date   CREATININE 0.99 02/28/2019   BUN 10 02/28/2019   NA 136 02/28/2019   K 4.3 02/28/2019   CL 101 02/28/2019   CO2 25 02/28/2019    Lab Results  Component Value Date   ALT 9 02/28/2019   AST 17 02/28/2019   ALKPHOS 59 08/13/2018   BILITOT 0.5 02/28/2019    Lab Results  Component Value Date   CHOL 125 02/28/2019   HDL 38 (L) 02/28/2019   LDLCALC 73 02/28/2019   TRIG 55 02/28/2019   CHOLHDL 3.3 02/28/2019   Lab Results  Component Value Date   LABRPR REACTIVE (A) 02/28/2019   RPRTITER 1:4 (H) 02/28/2019   HIV 1 RNA Quant (copies/mL)  Date Value  02/28/2019 15,400 (H)  01/20/2017 172 (H)  10/15/2015 20   CD4 T Cell Abs (/uL)  Date Value  02/28/2019 318 (L)  01/20/2017 490  10/15/2015 710     Problem List Items Addressed This Visit      High   HIV disease ([place])    He will start back on Biktarvy today and follow-up after lab work in 6 weeks.      Relevant Medications   bictegravir-emtricitabine-tenofovir AF (BIKTARVY) 50-200-25 MG TABS tablet   Other Relevant Orders   T-helper cell (CD4)- ([facility] clinic only)   HIV-1 RNA quant-no reflex-bld     Unprioritized   Late latent syphilis    His RPR continues to decline slowly after treatment for syphilis  several years ago.      Relevant Medications   bictegravir-emtricitabine-tenofovir AF (BIKTARVY) 50-200-25 MG TABS tablet   Depression    We will arrange a telephone E visit with 1 of our behavioral health counselors.      Cigarette smoker    I encouraged him to consider the benefits of quitting.           Michel Bickers, MD [facility] [phone] pager   [phone] cell 03/22/2019, 9:47 AM

## 2019-03-22 NOTE — Assessment & Plan Note (Signed)
We will arrange a telephone E visit with 1 of our behavioral health counselors.

## 2019-03-22 NOTE — Assessment & Plan Note (Signed)
I encouraged him to consider the benefits of quitting.

## 2019-03-23 ENCOUNTER — Telehealth: Payer: Self-pay | Admitting: Licensed Clinical Social Worker

## 2019-03-23 ENCOUNTER — Ambulatory Visit: Payer: Self-pay | Admitting: Licensed Clinical Social Worker

## 2019-03-23 NOTE — Telephone Encounter (Signed)
Counselor attempted contact for scheduled appointment at 9:00 and left a voicemail for patient to call back. Second attempt made at 9:19am and counselor left another voicemail requesting that patient contact [facility] to reschedule.

## 2019-04-23 ENCOUNTER — Encounter (HOSPITAL_COMMUNITY): Payer: Self-pay

## 2019-04-23 ENCOUNTER — Emergency Department (HOSPITAL_COMMUNITY)
Admission: EM | Admit: 2019-04-23 | Discharge: 2019-04-23 | Disposition: A | Discharge status: Home / Self Care | Payer: Self-pay | Attending: Emergency Medicine | Admitting: Emergency Medicine

## 2019-04-23 ENCOUNTER — Other Ambulatory Visit: Payer: Self-pay

## 2019-04-23 DIAGNOSIS — S300XXD Contusion of lower back and pelvis, subsequent encounter: Secondary | ICD-10-CM

## 2019-04-23 DIAGNOSIS — Y929 Unspecified place or not applicable: Secondary | ICD-10-CM | POA: Insufficient documentation

## 2019-04-23 DIAGNOSIS — Y9389 Activity, other specified: Secondary | ICD-10-CM | POA: Insufficient documentation

## 2019-04-23 DIAGNOSIS — F17210 Nicotine dependence, cigarettes, uncomplicated: Secondary | ICD-10-CM | POA: Diagnosis not present

## 2019-04-23 DIAGNOSIS — Z21 Asymptomatic human immunodeficiency virus [HIV] infection status: Secondary | ICD-10-CM | POA: Diagnosis not present

## 2019-04-23 DIAGNOSIS — S300XXA Contusion of lower back and pelvis, initial encounter: Secondary | ICD-10-CM | POA: Diagnosis not present

## 2019-04-23 DIAGNOSIS — R1011 Right upper quadrant pain: Secondary | ICD-10-CM | POA: Diagnosis not present

## 2019-04-23 DIAGNOSIS — R109 Unspecified abdominal pain: Secondary | ICD-10-CM

## 2019-04-23 DIAGNOSIS — S3992XA Unspecified injury of lower back, initial encounter: Secondary | ICD-10-CM | POA: Diagnosis present

## 2019-04-23 DIAGNOSIS — Y999 Unspecified external cause status: Secondary | ICD-10-CM | POA: Insufficient documentation

## 2019-04-23 MED ORDER — KETOROLAC TROMETHAMINE 30 MG/ML IJ SOLN
30.0000 mg | Freq: Once | INTRAMUSCULAR | Status: AC
  Administered 2019-04-23: 04:00:00 30 mg via INTRAMUSCULAR
  Filled 2019-04-23: qty 1

## 2019-04-23 MED ORDER — CYCLOBENZAPRINE HCL 5 MG PO TABS: 5.0000 mg | ORAL_TABLET | Freq: Three times a day (TID) | ORAL | 0 refills | Status: DC | PRN
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
## 2019-04-23 NOTE — Discharge Instructions (Signed)
Ice packs to the injured or sore muscles for the next several days then start using heat. Take the medications for pain and muscle spasms. Recheck if you aren't improving in the next week.  Return to the emergency department if you see blood in your urine or your pain gets worse.

## 2019-04-23 NOTE — ED Notes (Signed)
ED Provider at bedside. 

## 2019-05-03 ENCOUNTER — Telehealth: Payer: Self-pay

## 2019-05-03 ENCOUNTER — Other Ambulatory Visit: Payer: Self-pay

## 2019-05-03 NOTE — Telephone Encounter (Signed)
-----   Message from [place] sent at 05/03/2019  3:39 PM EDT ----- Regarding: Prescription refill Contact: self- [place] Patient called to say that he moved and that he needs his prescription to be moved to [facility] on [address] [place], [place] [address].   Michela Pitcher that he is completely out

## 2019-05-03 NOTE — Telephone Encounter (Signed)
Called patient to follow-up on request for medication to be sent to different pharmacy. Patient is on ADAP; we are unable to send in prescription to desired pharmacy. Patient would like to avoid mail order since he moved in within his [place]. Advised patient to contact pharmacy to arrange prescription transfer if ADAP will still cover medication. Patient states if not he is fine with going to [facility] in [place].  [place]

## 2019-05-15 ENCOUNTER — Other Ambulatory Visit: Payer: Self-pay

## 2019-05-23 ENCOUNTER — Encounter: Payer: Self-pay | Admitting: Internal Medicine

## 2019-05-25 ENCOUNTER — Encounter: Payer: Self-pay | Admitting: Internal Medicine

## 2019-09-20 ENCOUNTER — Other Ambulatory Visit: Payer: Self-pay

## 2019-09-21 ENCOUNTER — Telehealth: Payer: Self-pay | Admitting: Pharmacy Technician

## 2019-09-21 ENCOUNTER — Other Ambulatory Visit: Payer: Self-pay

## 2019-09-21 DIAGNOSIS — B20 Human immunodeficiency virus [HIV] disease: Secondary | ICD-10-CM

## 2019-09-21 NOTE — Telephone Encounter (Signed)
[facility] Patient Advocate Encounter   Patient has been approved for [employer] Advancing Access Patient Assistance Program for [place]  30 day coverage. This assistance will make the patient's copay $0.  I have spoken with the patient and they will take this information to [facility].  The billing information is:  Member ID: [id number] [place]: [id number] PCN: [id number] Group: [id number]  Patient knows to call the office with questions or concerns.  Bartholomew Crews, CPhT Specialty Pharmacy Patient [facility] Phone: [phone] Fax: [phone] 09/21/2019 11:29 AM

## 2019-09-22 LAB — T-HELPER CELL (CD4) - (RCID CLINIC ONLY)
CD4 % Helper T Cell: 23 % — ABNORMAL LOW (ref 33–65)
CD4 T Cell Abs: 390 /uL — ABNORMAL LOW (ref 400–1790)

## 2019-09-30 LAB — HIV-1 RNA QUANT-NO REFLEX-BLD
HIV 1 RNA Quant: 7370 copies/mL — ABNORMAL HIGH
HIV-1 RNA Quant, Log: 3.87 Log copies/mL — ABNORMAL HIGH

## 2019-10-04 ENCOUNTER — Encounter: Payer: Self-pay | Admitting: Internal Medicine

## 2019-10-17 ENCOUNTER — Ambulatory Visit: Payer: Self-pay

## 2019-11-02 NOTE — Telephone Encounter (Signed)
[facility] Patient Advocate Encounter  Received fax notification that the patient has been withdrawn from the [facility]. He was eligible 09/21/2019 to 10/21/2018 and no longer meets criteria. [phone] to dispute the status.

## 2020-02-27 ENCOUNTER — Encounter (HOSPITAL_COMMUNITY): Payer: Self-pay | Admitting: Emergency Medicine

## 2020-02-27 ENCOUNTER — Emergency Department (HOSPITAL_COMMUNITY)
Admission: EM | Admit: 2020-02-27 | Discharge: 2020-02-27 | Disposition: A | Discharge status: Home / Self Care | Payer: Self-pay | Attending: Emergency Medicine | Admitting: Emergency Medicine

## 2020-02-27 ENCOUNTER — Other Ambulatory Visit: Payer: Self-pay

## 2020-02-27 DIAGNOSIS — K0889 Other specified disorders of teeth and supporting structures: Secondary | ICD-10-CM | POA: Insufficient documentation

## 2020-02-27 DIAGNOSIS — F17210 Nicotine dependence, cigarettes, uncomplicated: Secondary | ICD-10-CM | POA: Insufficient documentation

## 2020-02-27 DIAGNOSIS — F909 Attention-deficit hyperactivity disorder, unspecified type: Secondary | ICD-10-CM | POA: Insufficient documentation

## 2020-02-27 DIAGNOSIS — Z79899 Other long term (current) drug therapy: Secondary | ICD-10-CM | POA: Insufficient documentation

## 2020-02-27 DIAGNOSIS — Z21 Asymptomatic human immunodeficiency virus [HIV] infection status: Secondary | ICD-10-CM | POA: Insufficient documentation

## 2020-02-27 MED ORDER — CLINDAMYCIN HCL 150 MG PO CAPS
300.0000 mg | ORAL_CAPSULE | Freq: Once | ORAL | Status: AC
  Administered 2020-02-27: 300 mg via ORAL
  Filled 2020-02-27: qty 2

## 2020-02-27 MED ORDER — HYDROCODONE-ACETAMINOPHEN 5-325 MG PO TABS
2.0000 | ORAL_TABLET | Freq: Once | ORAL | Status: AC
  Administered 2020-02-27: 2 via ORAL
  Filled 2020-02-27: qty 2

## 2020-02-27 MED ORDER — HYDROCODONE-ACETAMINOPHEN 5-325 MG PO TABS: 1.0000 | ORAL_TABLET | ORAL | 0 refills | Status: DC | PRN

## 2020-02-27 MED ORDER — CLINDAMYCIN HCL 300 MG PO CAPS
300.0000 mg | ORAL_CAPSULE | Freq: Three times a day (TID) | ORAL | 0 refills | Status: AC
Start: 2020-02-27 — End: 2020-03-08

## 2020-02-27 NOTE — ED Provider Notes (Signed)
[facility] EMERGENCY DEPARTMENT Provider Note   CSN: [id number] Arrival date & time: 02/27/20  1951     History Chief Complaint  Patient presents with  . Dental Pain    Julian Sanchez is a 24 y.o. male.  The history is provided by the patient. No language interpreter was used.  Dental Pain Location:  Lower Lower teeth location:  28/RL 1st bicuspid Quality:  Aching Severity:  Severe Onset quality:  Gradual Duration:  4 days Timing:  Constant Progression:  Worsening Chronicity:  New Relieved by:  Nothing Worsened by:  Nothing Pt complains of pain in his mouth      Past Medical History:  Diagnosis Date  . ADHD (attention deficit hyperactivity disorder)   . HIV disease ([place])   . Hx of seasonal allergies   . No pertinent past medical history   . Recurrent tonsillitis   . Wisdom teeth extracted     Patient Active Problem List   Diagnosis Date Noted  . Abscess of buttock, right   . Peritonsillar abscess 07/23/2017  . Homelessness 02/02/2017  . 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 Depression 09/10/2015  . HIV disease ([place]) 08/19/2015  . 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 Cigarette smoker 08/19/2015    Past Surgical History:  Procedure Laterality Date  . IRRIGATION AND DEBRIDEMENT ABSCESS  09/28/2012   Procedure: IRRIGATION AND DEBRIDEMENT ABSCESS;  Surgeon: Jodi Marble, MD;  Location: [place];  Service: ENT;  Laterality: Right;  Right Peritonsillar Abscess  . MOUTH SURGERY    . TONSILLECTOMY    . TONSILLECTOMY N/A 07/23/2017   Procedure: TONSILLECTOMY;  Surgeon: Melida Quitter, MD;  Location[facility];  Service: ENT;  Laterality: N/A;  . WISDOM TOOTH EXTRACTION         Family History  Problem Relation Age of Onset  . Diabetes Mother   . Asthma Father     Social History   Tobacco Use  . Smoking status: Light Tobacco Smoker    Packs/day: 0.40    Types: Cigarettes  . Smokeless tobacco: Never Used  Substance Use Topics  . Alcohol use: No    Alcohol/week:  0.0 standard drinks  . Drug use: Yes    Frequency: 7.0 times per week    Types: Marijuana    Home Medications Prior to Admission medications   Medication Sig Start Date End Date Taking? Authorizing Provider  bictegravir-emtricitabine-tenofovir AF (BIKTARVY) 50-200-25 MG TABS tablet Take 1 tablet by mouth daily. Patient not taking: Reported on 03/22/2019 03/22/19   Michel Bickers, MD  clindamycin (CLEOCIN) 300 MG capsule Take 1 capsule (300 mg total) by mouth 3 (three) times daily for 10 days. 02/27/20 03/08/20  Fransico Meadow, PA-C  cyclobenzaprine (FLEXERIL) 5 MG tablet Take 1 tablet (5 mg total) by mouth 3 (three) times daily as needed. 04/23/19   Rolland Porter, MD  HYDROcodone-acetaminophen (NORCO/VICODIN) 5-325 MG tablet Take 1 tablet by mouth every 4 (four) hours as needed. 02/27/20   Fransico Meadow, PA-C    Allergies    Patient has no known allergies.  Review of Systems   Review of Systems  HENT: Positive for dental problem.   All other systems reviewed and are negative.   Physical Exam Updated Vital Signs BP 133/83 (BP Location: Right Arm)   Pulse 90   Temp 98.9 F (37.2 C) (Oral)   Resp 18   Ht 6' (1.829 m)   Wt 79.4 kg   SpO2 100%   BMI 23.73 kg/m  Physical Exam Vitals and nursing note reviewed.  Constitutional:      Appearance: He is well-developed.  HENT:     Head: Normocephalic and atraumatic.     Nose: Nose normal.     Mouth/Throat:     Mouth: Mucous membranes are moist.  Eyes:     Conjunctiva/sclera: Conjunctivae normal.  Cardiovascular:     Rate and Rhythm: Normal rate and regular rhythm.     Heart sounds: No murmur.  Pulmonary:     Effort: Pulmonary effort is normal. No respiratory distress.     Breath sounds: Normal breath sounds.  Abdominal:     Palpations: Abdomen is soft.     Tenderness: There is no abdominal tenderness.  Musculoskeletal:     Cervical back: Neck supple.  Skin:    General: Skin is warm and dry.  Neurological:     General: No  focal deficit present.     Mental Status: He is alert.  Psychiatric:        Mood and Affect: Mood normal.     ED Results / Procedures / Treatments   Labs (all labs ordered are listed, but only abnormal results are displayed) Labs Reviewed - No data to display  EKG None  Radiology No results found.  Procedures Procedures (including critical care time)  Medications Ordered in ED Medications  HYDROcodone-acetaminophen (NORCO/VICODIN) 5-325 MG per tablet 2 tablet (2 tablets Oral Given 02/27/20 2044)  clindamycin (CLEOCIN) capsule 300 mg (300 mg Oral Given 02/27/20 2044)    ED Course  I have reviewed the triage vital signs and the nursing notes.  Pertinent labs & imaging results that were available during my care of the patient were reviewed by me and considered in my medical decision making (see chart for details).    MDM Rules/Calculators/A&P                      MDM: Pt given clindamycin and 2 hydrocodone here.  Pt given dental referral information  Final Clinical Impression(s) / ED Diagnoses Final diagnoses:  Pain, dental    Rx / DC Orders ED Discharge Orders         Ordered    clindamycin (CLEOCIN) 300 MG capsule  3 times daily     02/27/20 2041    HYDROcodone-acetaminophen (NORCO/VICODIN) 5-325 MG tablet  Every 4 hours PRN     02/27/20 2041        An After Visit Summary was printed and given to the patient.    Elson Areas, Cordelia Poche 02/27/20 2059    Maia Plan, MD 02/28/20 1241

## 2020-02-27 NOTE — ED Triage Notes (Signed)
Patient states dental pain x 4 days on the right lower bottom.

## 2020-03-05 ENCOUNTER — Emergency Department (HOSPITAL_COMMUNITY): Payer: Self-pay

## 2020-03-05 ENCOUNTER — Encounter (HOSPITAL_COMMUNITY): Payer: Self-pay | Admitting: Emergency Medicine

## 2020-03-05 ENCOUNTER — Other Ambulatory Visit: Payer: Self-pay

## 2020-03-05 ENCOUNTER — Emergency Department (HOSPITAL_COMMUNITY)
Admission: EM | Admit: 2020-03-05 | Discharge: 2020-03-05 | Disposition: A | Discharge status: Home / Self Care | Payer: Self-pay | Attending: Emergency Medicine | Admitting: Emergency Medicine

## 2020-03-05 DIAGNOSIS — F17210 Nicotine dependence, cigarettes, uncomplicated: Secondary | ICD-10-CM | POA: Insufficient documentation

## 2020-03-05 DIAGNOSIS — R1031 Right lower quadrant pain: Secondary | ICD-10-CM | POA: Insufficient documentation

## 2020-03-05 DIAGNOSIS — N492 Inflammatory disorders of scrotum: Secondary | ICD-10-CM | POA: Insufficient documentation

## 2020-03-05 DIAGNOSIS — B20 Human immunodeficiency virus [HIV] disease: Secondary | ICD-10-CM | POA: Insufficient documentation

## 2020-03-05 DIAGNOSIS — L0291 Cutaneous abscess, unspecified: Secondary | ICD-10-CM

## 2020-03-05 LAB — COMPREHENSIVE METABOLIC PANEL
ALT: 15 U/L (ref 0–44)
AST: 23 U/L (ref 15–41)
Albumin: 4.1 g/dL (ref 3.5–5.0)
Alkaline Phosphatase: 69 U/L (ref 38–126)
Anion gap: 13 (ref 5–15)
BUN: 10 mg/dL (ref 6–20)
CO2: 22 mmol/L (ref 22–32)
Calcium: 9.6 mg/dL (ref 8.9–10.3)
Chloride: 99 mmol/L (ref 98–111)
Creatinine, Ser: 0.68 mg/dL (ref 0.61–1.24)
GFR calc Af Amer: >60 mL/min (ref >60)
GFR calc non Af Amer: >60 mL/min (ref >60)
Glucose, Bld: 93 mg/dL (ref 70–99)
Potassium: 3.6 mmol/L (ref 3.5–5.1)
Sodium: 134 mmol/L — ABNORMAL LOW (ref 135–145)
Total Bilirubin: 0.8 mg/dL (ref 0.3–1.2)
Total Protein: 8.9 g/dL — ABNORMAL HIGH (ref 6.5–8.1)

## 2020-03-05 LAB — URINALYSIS, ROUTINE W REFLEX MICROSCOPIC
Bacteria, UA: NONE SEEN
Glucose, UA: NEGATIVE mg/dL
Hgb urine dipstick: NEGATIVE
Ketones, ur: 20 mg/dL — AB
Leukocytes,Ua: NEGATIVE
Nitrite: NEGATIVE
Protein, ur: 30 mg/dL — AB
Specific Gravity, Urine: 1.024 (ref 1.005–1.030)
pH: 7 (ref 5.0–8.0)

## 2020-03-05 LAB — CBC WITH DIFFERENTIAL/PLATELET
Abs Immature Granulocytes: 0.03 10*3/uL (ref 0.00–0.07)
Basophils Absolute: 0 10*3/uL (ref 0.0–0.1)
Basophils Relative: 0 %
Eosinophils Absolute: 0.1 10*3/uL (ref 0.0–0.5)
Eosinophils Relative: 1 %
HCT: 46.7 % (ref 39.0–52.0)
Hemoglobin: 14.7 g/dL (ref 13.0–17.0)
Immature Granulocytes: 0 %
Lymphocytes Relative: 13 %
Lymphs Abs: 1.8 10*3/uL (ref 0.7–4.0)
MCH: 26.3 pg (ref 26.0–34.0)
MCHC: 31.5 g/dL (ref 30.0–36.0)
MCV: 83.4 fL (ref 80.0–100.0)
Monocytes Absolute: 0.8 10*3/uL (ref 0.1–1.0)
Monocytes Relative: 6 %
Neutro Abs: 10.5 10*3/uL — ABNORMAL HIGH (ref 1.7–7.7)
Neutrophils Relative %: 80 %
Platelets: 254 10*3/uL (ref 150–400)
RBC: 5.6 MIL/uL (ref 4.22–5.81)
RDW: 11.8 % (ref 11.5–15.5)
WBC: 13.2 10*3/uL — ABNORMAL HIGH (ref 4.0–10.5)
nRBC: 0 % (ref 0.0–0.2)

## 2020-03-05 LAB — LACTIC ACID, PLASMA: Lactic Acid, Venous: 1.2 mmol/L (ref 0.5–1.9)

## 2020-03-05 MED ORDER — FENTANYL CITRATE (PF) 100 MCG/2ML IJ SOLN: 50.0000 ug | Freq: Once | INTRAMUSCULAR | Status: DC

## 2020-03-05 MED ORDER — LIDOCAINE HCL URETHRAL/MUCOSAL 2 % EX GEL
1.0000 "application " | Freq: Once | CUTANEOUS | Status: AC
  Administered 2020-03-05: 1 via TOPICAL
  Filled 2020-03-05: qty 10

## 2020-03-05 MED ORDER — FENTANYL CITRATE (PF) 100 MCG/2ML IJ SOLN
100.0000 ug | Freq: Once | INTRAMUSCULAR | Status: AC
  Administered 2020-03-05: 100 ug via INTRAVENOUS
  Filled 2020-03-05: qty 2

## 2020-03-05 MED ORDER — VANCOMYCIN HCL IN DEXTROSE 1-5 GM/200ML-% IV SOLN
1000.0000 mg | Freq: Once | INTRAVENOUS | Status: AC
  Administered 2020-03-05: 1000 mg via INTRAVENOUS
  Filled 2020-03-05: qty 200

## 2020-03-05 MED ORDER — PIPERACILLIN-TAZOBACTAM 3.375 G IVPB
3.3750 g | Freq: Once | INTRAVENOUS | Status: AC
  Administered 2020-03-05: 3.375 g via INTRAVENOUS
  Filled 2020-03-05: qty 50

## 2020-03-05 MED ORDER — LIDOCAINE-EPINEPHRINE (PF) 1 %-1:200000 IJ SOLN
10.0000 mL | Freq: Once | INTRAMUSCULAR | Status: AC
  Administered 2020-03-05: 10 mL via INTRADERMAL
  Filled 2020-03-05: qty 30

## 2020-03-05 MED ORDER — IOHEXOL 300 MG/ML  SOLN
100.0000 mL | Freq: Once | INTRAMUSCULAR | Status: AC | PRN
  Administered 2020-03-05: 100 mL via INTRAVENOUS

## 2020-03-05 NOTE — ED Notes (Signed)
Julian Sanchez friend [phone]

## 2020-03-05 NOTE — ED Triage Notes (Signed)
PT c/o redness, swelling and pain to right groin area with no drainage after warm epson salt baths and no fevers x3 days.

## 2020-03-05 NOTE — Discharge Instructions (Addendum)
Continue your clindamycin. Soak and wash 3 times daily.  Make sure you wash the bathtub thoroughly with household bleach soap and water after each use.  Use Tylenol and ibuprofen for pain as needed. Call the urologist for urgent appointment.

## 2020-03-05 NOTE — ED Notes (Signed)
Pt in CT at this time.

## 2020-03-05 NOTE — ED Provider Notes (Signed)
[facility] EMERGENCY DEPARTMENT Provider Note   CSN: [id number] Arrival date & time: 03/05/20  0092     History Chief Complaint  Patient presents with  . Abscess    Julian Sanchez is a 24 y.o. male.  HPI   Patient with abscess on right groin.  States he is unsure exactly when it started but he already had a palpable nodule there a week ago when he went to the emergency department for dental pain.  He said that the nodule was not significantly painful at that time so he did not mention it because he was hoping it would go away.  He denies any fevers, he said his last bowel movement was 2 days ago but it is hurt too much to go since, he said there was no blood in that.  He said he has been urinating without any issue.  He says the pain is spread at least 3 to 4 inches in all directions beyond the obvious lesion in the right scrotum.  No known sick contacts, patient denies any illicit drug use beyond marijuana.  Patient says he has been taking his HIV medicine as prescribed since reestablishing with infectious disease.  He denies any chest pain or trouble breathing.  Denies any confusion or diaphoresis.  Of note he does have a known dental infection for which she was taking clindamycin since 18 May.  He says that his dental issues have resolved and he has no swelling at this time.  Past Medical History:  Diagnosis Date  . ADHD (attention deficit hyperactivity disorder)   . HIV disease (HCC)   . Hx of seasonal allergies   . No pertinent past medical history   . Recurrent tonsillitis   . Wisdom teeth extracted     Patient Active Problem List   Diagnosis Date Noted  . Abscess of buttock, right   . Peritonsillar abscess 07/23/2017  . Homelessness 02/02/2017  . 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 Depression 09/10/2015  . HIV disease (HCC) 08/19/2015  . 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 Cigarette smoker 08/19/2015    Past Surgical History:  Procedure Laterality Date  . IRRIGATION AND  DEBRIDEMENT ABSCESS  09/28/2012   Procedure: IRRIGATION AND DEBRIDEMENT ABSCESS;  Surgeon: Flo Shanks, MD;  Location: [facility] OR;  Service: ENT;  Laterality: Right;  Right Peritonsillar Abscess  . MOUTH SURGERY    . TONSILLECTOMY    . TONSILLECTOMY N/A 07/23/2017   Procedure: TONSILLECTOMY;  Surgeon: Christia Reading, MD;  Location: [facility] OR;  Service: ENT;  Laterality: N/A;  . WISDOM TOOTH EXTRACTION         Family History  Problem Relation Age of Onset  . Diabetes Mother   . Asthma Father     Social History   Tobacco Use  . Smoking status: Light Tobacco Smoker    Packs/day: 0.40    Types: Cigarettes  . Smokeless tobacco: Never Used  Substance Use Topics  . Alcohol use: No    Alcohol/week: 0.0 standard drinks  . Drug use: Yes    Frequency: 7.0 times per week    Types: Marijuana    Comment: today    Home Medications Prior to Admission medications   Medication Sig Start Date End Date Taking? Authorizing Provider  bictegravir-emtricitabine-tenofovir AF (BIKTARVY) 50-200-25 MG TABS tablet Take 1 tablet by mouth daily. 03/22/19  Yes Cliffton Asters, MD  clindamycin (CLEOCIN) 300 MG capsule Take 1 capsule (300 mg total) by mouth 3 (three) times daily for 10 days.  02/27/20 03/08/20 Yes Elson AreasSofia, Leslie K, PA-C  cyclobenzaprine (FLEXERIL) 5 MG tablet Take 1 tablet (5 mg total) by mouth 3 (three) times daily as needed. Patient not taking: Reported on 03/05/2020 04/23/19   Devoria AlbeKnapp, Iva, MD  HYDROcodone-acetaminophen (NORCO/VICODIN) 5-325 MG tablet Take 1 tablet by mouth every 4 (four) hours as needed. 02/27/20   Elson AreasSofia, Leslie K, PA-C    Allergies    Patient has no known allergies.  Review of Systems   Review of Systems  Constitutional: Negative.   HENT: Negative.   Eyes: Negative.   Respiratory: Negative.   Cardiovascular: Negative.   Gastrointestinal: Positive for rectal pain. Negative for abdominal distention, abdominal pain, anal bleeding, blood in stool, constipation, diarrhea, nausea and  vomiting.  Genitourinary: Positive for genital sores and scrotal swelling. Negative for decreased urine volume, difficulty urinating, discharge, dysuria, enuresis, flank pain, frequency, hematuria, penile pain, penile swelling, testicular pain and urgency.       Over 1 week palpable lesion of right scrotum, became much more painful and the pain has now spread into the general groin for the last 2 days.  Musculoskeletal: Negative.   Skin: Positive for wound.       Right-sided scrotal abscess  Neurological: Negative.   Psychiatric/Behavioral: Negative.     Physical Exam Updated Vital Signs BP 116/73   Pulse 89   Temp 98.9 F (37.2 C) (Oral)   Resp 18   Ht 6' (1.829 m)   Wt 83.9 kg   SpO2 100%   BMI 25.09 kg/m   Physical Exam Vitals and nursing note reviewed.  Constitutional:      Appearance: Normal appearance. He is normal weight.  HENT:     Head: Normocephalic.  Cardiovascular:     Rate and Rhythm: Normal rate and regular rhythm.     Pulses: Normal pulses.  Pulmonary:     Effort: Pulmonary effort is normal.     Breath sounds: Normal breath sounds.  Abdominal:     General: Abdomen is flat.  Skin:    General: Skin is warm.     Findings: Lesion present.     Comments: Right-sided scrotal swelling consistent with likely abscess, does not appear to contain testicle.  Difficult to further characterize because pain response and patient was so significant that he was guarding and would not allow me to touch the lesion.  He did have pain extending 3 to 4 inches down the thigh on the right side and around to the rectum.  Edema did not appear to extend into the penis  Neurological:     Mental Status: He is alert and oriented to person, place, and time.  Psychiatric:        Mood and Affect: Mood normal.        Behavior: Behavior normal.        Thought Content: Thought content normal.        Judgment: Judgment normal.     ED Results / Procedures / Treatments   Labs (all labs  ordered are listed, but only abnormal results are displayed) Labs Reviewed  COMPREHENSIVE METABOLIC PANEL - Abnormal; Notable for the following components:      Result Value   Sodium 134 (*)    Total Protein 8.9 (*)    All other components within normal limits  CBC WITH DIFFERENTIAL/PLATELET - Abnormal; Notable for the following components:   WBC 13.2 (*)    Neutro Abs 10.5 (*)    All other components within normal limits  URINALYSIS, ROUTINE W REFLEX MICROSCOPIC - Abnormal; Notable for the following components:   Bilirubin Urine SMALL (*)    Ketones, ur 20 (*)    Protein, ur 30 (*)    All other components within normal limits  CULTURE, BLOOD (ROUTINE X 2)  CULTURE, BLOOD (ROUTINE X 2)  LACTIC ACID, PLASMA  T-HELPER CELLS (CD4) COUNT (NOT AT [facility])    EKG None  Radiology US Scrotum  Result Date: 03/05/2020 CLINICAL DATA:  24 year old male with right groin pain swelling and redness for 3 days. EXAM: SCROTAL ULTRASOUND DOPPLER ULTRASOUND OF THE TESTICLES TECHNIQUE: Complete ultrasound examination of the testicles, epididymis, and other scrotal structures was performed. Color and spectral Doppler ultrasound were also utilized to evaluate blood flow to the testicles. COMPARISON:  CT Abdomen and Pelvis 08/13/2018. FINDINGS: Right testicle Measurements: 3.8 x 2.0 x 2.3 cm. No mass or microlithiasis visualized. Left testicle Measurements: 3.8 x 1.9 x 2.5 cm. No mass or microlithiasis visualized. Right epididymis: Normal in size and appearance. No hypervascularity. Left epididymis: Normal in size and appearance. Incidental 3 mm epididymal head cyst (normal variant). No hypervascularity. Hydrocele:  Small simple appearing hydroceles greater on the right. Varicocele:  None visualized. Pulsed Doppler interrogation of both testes demonstrates normal low resistance arterial and venous waveforms bilaterally. Other findings: In the right groin lateral to the testis a complex nonvascular mixed  echogenicity collection is identified measuring 3.8 x 2.1 by 2.1 cm. See images 66, 67, 71, and 77. IMPRESSION: 1. Positive for a complex nonvascular appearing fluid collection in the right groin lateral to the scrotum measuring up to 3.8 cm diameter. This is suspicious for Abscess. See images 66, 67, 71, and 77. 2. Otherwise Negative Scrotal Ultrasound with Doppler; trace hydroceles. Electronically Signed   By: Genevie Ann M.D.   On: 03/05/2020 11:00   CT Abdomen Pelvis W Contrast  Result Date: 03/05/2020 CLINICAL DATA:  Right groin pain and swelling, possible Fournier's gangrene EXAM: CT ABDOMEN AND PELVIS WITH CONTRAST TECHNIQUE: Multidetector CT imaging of the abdomen and pelvis was performed using the standard protocol following bolus administration of intravenous contrast. CONTRAST:  153mL OMNIPAQUE IOHEXOL 300 MG/ML  SOLN COMPARISON:  08/13/2018 FINDINGS: Lower chest: No acute abnormality. Hepatobiliary: No focal liver abnormality is seen. No gallstones, gallbladder wall thickening, or biliary dilatation. Pancreas: Unremarkable. No pancreatic ductal dilatation or surrounding inflammatory changes. Spleen: Normal in size without focal abnormality. Adrenals/Urinary Tract: Horseshoe kidney is noted. No obstructive changes are seen. The adrenal glands are within normal limits. Bladder is decompressed. Stomach/Bowel: No obstructive or inflammatory changes of the colon are seen. The small bowel and stomach are unremarkable. The appendix is not well visualized although no inflammatory changes to suggest appendicitis are seen. Vascular/Lymphatic: No significant vascular findings are present. Small reactive inguinal lymph nodes are noted bilaterally. Reproductive: Prostate is within normal limits. Testicles are unremarkable similar to that seen on prior ultrasound examination. Just superior and lateral to the right testicle along the spermatic cord there is a 3.0 x 1.8 cm hypo dense fluid collection which corresponds  with that seen on recent ultrasound. This is consistent with a focal abscess till proven otherwise. Some surrounding scrotal thickness is noted. Small reactive inguinal lymph nodes are noted bilaterally. Other: No free fluid is seen. Musculoskeletal: No acute or significant osseous findings. IMPRESSION: Fluid collection along the spermatic cord on the right similar to that seen on prior ultrasound examination. This is consistent with a focal abscess till proven otherwise. Mild scrotal thickening is  seen. No air is identified. The perineum does not appear to be involved. No other focal abnormality is noted. Electronically Signed   By: Alcide Clever M.D.   On: 03/05/2020 11:32    Procedures .Marland KitchenIncision and Drainage  Date/Time: 03/05/2020 2:33 PM Performed by: Marthenia Rolling, DO Authorized by: Blane Ohara, MD   Consent:    Consent obtained:  Verbal   Consent given by:  Patient   Risks discussed:  Bleeding, incomplete drainage, damage to other organs and pain   Alternatives discussed:  No treatment and delayed treatment Location:    Type:  Abscess   Size:  2x3cm   Location:  Anogenital   Anogenital location:  Scrotal wall Pre-procedure details:    Skin preparation:  Antiseptic wash Anesthesia (see MAR for exact dosages):    Anesthesia method:  Topical application and local infiltration   Topical anesthetic:  Lidocaine gel   Local anesthetic:  Lidocaine 1% WITH epi Procedure type:    Complexity:  Complex Procedure details:    Needle aspiration: yes     Needle size:  18 G   Incision types:  Single straight   Incision depth:  Subcutaneous   Scalpel blade:  10   Wound management:  Probed and deloculated   Drainage:  Purulent   Drainage amount:  Copious   Wound treatment:  Wound left open (1/4' packing)   Packing materials:  1/4 in gauze Post-procedure details:    Patient tolerance of procedure:  Tolerated well, no immediate complications   (including critical care time)  Medications  Ordered in ED Medications  piperacillin-tazobactam (ZOSYN) IVPB 3.375 g (3.375 g Intravenous New Bag/Given 03/05/20 1118)  fentaNYL (SUBLIMAZE) injection 100 mcg (100 mcg Intravenous Given 03/05/20 1011)  vancomycin (VANCOCIN) IVPB 1000 mg/200 mL premix (0 mg Intravenous Stopped 03/05/20 1229)  iohexol (OMNIPAQUE) 300 MG/ML solution 100 mL (100 mLs Intravenous Contrast Given 03/05/20 1102)  fentaNYL (SUBLIMAZE) injection 100 mcg (100 mcg Intravenous Given 03/05/20 1248)  lidocaine (XYLOCAINE) 2 % jelly 1 application (1 application Topical Given 03/05/20 1251)  lidocaine-EPINEPHrine (XYLOCAINE-EPINEPHrine) 1 %-1:200000 (PF) injection 10 mL (10 mLs Intradermal Given 03/05/20 1441)    ED Course  I have reviewed the triage vital signs and the nursing notes.  Pertinent labs & imaging results that were available during my care of the patient were reviewed by me and considered in my medical decision making (see chart for details).    MDM Rules/Calculators/A&P                      Patient with more than 1 week scrotal swelling that he says is consistent with prior abscesses, given most recently charted is uncontrolled HIV and spreading of pain 3 to 4 inches around the lesion there is a concern for potential of more systemic infection versus necrotizing fasciitis/Fournier's.  Will obtain blood cultures (could be impacted by the fact that patient has been on Clinda since 5/18), CD4, CBC CMP, urine, scrotal ultrasound and CT abdomen pelvis.  We will start with empiric Vanco and Zosyn.  Patient is vital signs stable so will not treat as septic shock at this time do not see reason for IV bolus of fluids.  Pain control with fentanyl initially.  CT abdomen indicated focal abscess only with no indication of necrotizing fasciitis, discussed case with on-call urology who suggested we should be able to I&D here in the emergency room.  Discussed with patient and obtained verbal consent after discussing risk versus  benefits.  Incision and drainage performed as noted above.  Procedure was tolerated well and pain significantly relieved, patient discharged with instructions for close outpatient follow-up and return precautions.  Final Clinical Impression(s) / ED Diagnoses Final diagnoses:  Abscess  Abscess, scrotum    Rx / DC Orders ED Discharge Orders    None       Marthenia Rolling, DO 03/05/20 1448    Blane Ohara, MD 03/07/20 [phone]

## 2020-03-05 NOTE — ED Notes (Signed)
US in process

## 2020-03-06 LAB — T-HELPER CELLS (CD4) COUNT (NOT AT ARMC)
CD4 % Helper T Cell: 22 % — ABNORMAL LOW (ref 33–65)
CD4 T Cell Abs: 325 /uL — ABNORMAL LOW (ref 400–1790)

## 2020-03-10 LAB — CULTURE, BLOOD (ROUTINE X 2)
Culture: NO GROWTH
Culture: NO GROWTH
Special Requests: ADEQUATE
Special Requests: ADEQUATE

## 2020-04-17 ENCOUNTER — Ambulatory Visit: Payer: Self-pay | Admitting: Pharmacist

## 2020-04-17 ENCOUNTER — Telehealth: Payer: Self-pay | Admitting: Pharmacy Technician

## 2020-04-17 ENCOUNTER — Ambulatory Visit: Payer: Self-pay

## 2020-04-17 NOTE — Telephone Encounter (Signed)
[facility]ID Patient Product/process development scientist completed.    The patient is insured through [employer] and has a $3,309.66 copay.  We will continue to follow to see if copay assistance is needed.  Netty Starring. Dimas Aguas CPhT Specialty Pharmacy Patient [facility] Phone: [phone] Fax:  [phone]

## 2020-09-09 ENCOUNTER — Ambulatory Visit: Payer: Self-pay | Admitting: Pharmacist

## 2020-09-09 ENCOUNTER — Ambulatory Visit: Payer: Self-pay

## 2021-03-04 ENCOUNTER — Encounter (HOSPITAL_COMMUNITY): Payer: Self-pay | Admitting: *Deleted

## 2021-03-04 ENCOUNTER — Emergency Department (HOSPITAL_COMMUNITY): Payer: Self-pay

## 2021-03-04 ENCOUNTER — Other Ambulatory Visit: Payer: Self-pay

## 2021-03-04 ENCOUNTER — Inpatient Hospital Stay (HOSPITAL_COMMUNITY)
Admission: EM | Admit: 2021-03-04 | Discharge: 2021-03-09 | DRG: 717 | Disposition: A | Discharge status: Home / Self Care | Payer: Self-pay | Attending: Internal Medicine | Admitting: Internal Medicine

## 2021-03-04 DIAGNOSIS — K6289 Other specified diseases of anus and rectum: Secondary | ICD-10-CM | POA: Diagnosis present

## 2021-03-04 DIAGNOSIS — Z79899 Other long term (current) drug therapy: Secondary | ICD-10-CM

## 2021-03-04 DIAGNOSIS — F909 Attention-deficit hyperactivity disorder, unspecified type: Secondary | ICD-10-CM | POA: Diagnosis present

## 2021-03-04 DIAGNOSIS — B20 Human immunodeficiency virus [HIV] disease: Secondary | ICD-10-CM | POA: Diagnosis present

## 2021-03-04 DIAGNOSIS — L02215 Cutaneous abscess of perineum: Principal | ICD-10-CM | POA: Diagnosis present

## 2021-03-04 DIAGNOSIS — Z833 Family history of diabetes mellitus: Secondary | ICD-10-CM

## 2021-03-04 DIAGNOSIS — F17210 Nicotine dependence, cigarettes, uncomplicated: Secondary | ICD-10-CM | POA: Diagnosis present

## 2021-03-04 DIAGNOSIS — L02214 Cutaneous abscess of groin: Secondary | ICD-10-CM | POA: Diagnosis present

## 2021-03-04 DIAGNOSIS — N493 Fournier gangrene: Principal | ICD-10-CM | POA: Diagnosis present

## 2021-03-04 DIAGNOSIS — E46 Unspecified protein-calorie malnutrition: Secondary | ICD-10-CM | POA: Diagnosis present

## 2021-03-04 DIAGNOSIS — Z825 Family history of asthma and other chronic lower respiratory diseases: Secondary | ICD-10-CM

## 2021-03-04 DIAGNOSIS — F1290 Cannabis use, unspecified, uncomplicated: Secondary | ICD-10-CM

## 2021-03-04 DIAGNOSIS — D509 Iron deficiency anemia, unspecified: Secondary | ICD-10-CM | POA: Diagnosis present

## 2021-03-04 DIAGNOSIS — L732 Hidradenitis suppurativa: Secondary | ICD-10-CM | POA: Diagnosis present

## 2021-03-04 DIAGNOSIS — E871 Hypo-osmolality and hyponatremia: Secondary | ICD-10-CM | POA: Diagnosis present

## 2021-03-04 DIAGNOSIS — Z6823 Body mass index (BMI) 23.0-23.9, adult: Secondary | ICD-10-CM

## 2021-03-04 DIAGNOSIS — E8809 Other disorders of plasma-protein metabolism, not elsewhere classified: Secondary | ICD-10-CM | POA: Diagnosis present

## 2021-03-04 DIAGNOSIS — N492 Inflammatory disorders of scrotum: Secondary | ICD-10-CM | POA: Diagnosis present

## 2021-03-04 DIAGNOSIS — L02818 Cutaneous abscess of other sites: Secondary | ICD-10-CM | POA: Diagnosis present

## 2021-03-04 DIAGNOSIS — Z20822 Contact with and (suspected) exposure to covid-19: Secondary | ICD-10-CM | POA: Diagnosis present

## 2021-03-04 DIAGNOSIS — E860 Dehydration: Secondary | ICD-10-CM | POA: Diagnosis present

## 2021-03-04 LAB — CBC WITH DIFFERENTIAL/PLATELET
Abs Immature Granulocytes: 0.06 10*3/uL (ref 0.00–0.07)
Basophils Absolute: 0 10*3/uL (ref 0.0–0.1)
Basophils Relative: 0 %
Eosinophils Absolute: 0 10*3/uL (ref 0.0–0.5)
Eosinophils Relative: 0 %
HCT: 31.4 % — ABNORMAL LOW (ref 39.0–52.0)
Hemoglobin: 10.4 g/dL — ABNORMAL LOW (ref 13.0–17.0)
Immature Granulocytes: 1 %
Lymphocytes Relative: 16 %
Lymphs Abs: 1.7 10*3/uL (ref 0.7–4.0)
MCH: 26.8 pg (ref 26.0–34.0)
MCHC: 33.1 g/dL (ref 30.0–36.0)
MCV: 80.9 fL (ref 80.0–100.0)
Monocytes Absolute: 0.7 10*3/uL (ref 0.1–1.0)
Monocytes Relative: 7 %
Neutro Abs: 8.1 10*3/uL — ABNORMAL HIGH (ref 1.7–7.7)
Neutrophils Relative %: 76 %
Platelets: 411 10*3/uL — ABNORMAL HIGH (ref 150–400)
RBC: 3.88 MIL/uL — ABNORMAL LOW (ref 4.22–5.81)
RDW: 15.5 % (ref 11.5–15.5)
WBC: 10.6 10*3/uL — ABNORMAL HIGH (ref 4.0–10.5)
nRBC: 0 % (ref 0.0–0.2)

## 2021-03-04 LAB — COMPREHENSIVE METABOLIC PANEL
ALT: 11 U/L (ref 0–44)
AST: 16 U/L (ref 15–41)
Albumin: 2.7 g/dL — ABNORMAL LOW (ref 3.5–5.0)
Alkaline Phosphatase: 73 U/L (ref 38–126)
Anion gap: 7 (ref 5–15)
BUN: 6 mg/dL (ref 6–20)
CO2: 26 mmol/L (ref 22–32)
Calcium: 8.8 mg/dL — ABNORMAL LOW (ref 8.9–10.3)
Chloride: 100 mmol/L (ref 98–111)
Creatinine, Ser: 0.79 mg/dL (ref 0.61–1.24)
GFR, Estimated: >60 mL/min (ref >60)
Glucose, Bld: 86 mg/dL (ref 70–99)
Potassium: 3.5 mmol/L (ref 3.5–5.1)
Sodium: 133 mmol/L — ABNORMAL LOW (ref 135–145)
Total Bilirubin: 0.4 mg/dL (ref 0.3–1.2)
Total Protein: 9 g/dL — ABNORMAL HIGH (ref 6.5–8.1)

## 2021-03-04 LAB — RESP PANEL BY RT-PCR (FLU A&B, COVID) ARPGX2
Influenza A by PCR: NEGATIVE
Influenza B by PCR: NEGATIVE
SARS Coronavirus 2 by RT PCR: NEGATIVE

## 2021-03-04 LAB — LACTIC ACID, PLASMA: Lactic Acid, Venous: 1.1 mmol/L (ref 0.5–1.9)

## 2021-03-04 MED ORDER — SODIUM CHLORIDE 0.9 % IV BOLUS
1000.0000 mL | Freq: Once | INTRAVENOUS | Status: AC
  Administered 2021-03-04: 1000 mL via INTRAVENOUS

## 2021-03-04 MED ORDER — VANCOMYCIN HCL IN DEXTROSE 1-5 GM/200ML-% IV SOLN
1000.0000 mg | Freq: Three times a day (TID) | INTRAVENOUS | Status: DC
  Administered 2021-03-05 – 2021-03-08 (×9): 1000 mg via INTRAVENOUS
  Filled 2021-03-04 (×12): qty 200

## 2021-03-04 MED ORDER — ACETAMINOPHEN 325 MG PO TABS
650.0000 mg | ORAL_TABLET | Freq: Once | ORAL | Status: AC
  Administered 2021-03-04: 650 mg via ORAL
  Filled 2021-03-04: qty 2

## 2021-03-04 MED ORDER — PIPERACILLIN-TAZOBACTAM 3.375 G IVPB
3.3750 g | Freq: Three times a day (TID) | INTRAVENOUS | Status: DC
  Administered 2021-03-05 – 2021-03-09 (×13): 3.375 g via INTRAVENOUS
  Filled 2021-03-04 (×14): qty 50

## 2021-03-04 MED ORDER — PIPERACILLIN-TAZOBACTAM 3.375 G IVPB 30 MIN
3.3750 g | Freq: Once | INTRAVENOUS | Status: AC
  Administered 2021-03-04: 3.375 g via INTRAVENOUS
  Filled 2021-03-04: qty 50

## 2021-03-04 MED ORDER — IOHEXOL 300 MG/ML  SOLN
100.0000 mL | Freq: Once | INTRAMUSCULAR | Status: AC | PRN
  Administered 2021-03-04: 100 mL via INTRAVENOUS

## 2021-03-04 MED ORDER — VANCOMYCIN HCL 1500 MG/300ML IV SOLN
1500.0000 mg | INTRAVENOUS | Status: AC
  Administered 2021-03-04: 1500 mg via INTRAVENOUS
  Filled 2021-03-04: qty 300

## 2021-03-04 NOTE — ED Provider Notes (Signed)
This patient is an ill-appearing 25 year old male with HIV, immunosuppressed who presents febrile tachycardic and having a large area of the perineum which is draining purulent material, tender to the touch with lymphadenopathy in the bilateral inguinal regions.  Normal-appearing penis, soft nontender abdomen, lungs are clear, speaks in regular unlabored sentences.  Will need labs, CT scan of the pelvis with contrast to formally evaluate for possible necrotizing fasciitis, he will need antibiotics admission and possibly surgical consultation.  Agree with Critical care services as documented by PA [facility] screening examination/treatment/procedure(s) were conducted as a shared visit with non-physician practitioner(s) and myself.  I personally evaluated the patient during the encounter.  Clinical Impression:   Final diagnoses:  Perineal abscess   Sepsis      Eber Hong, MD 03/05/21 [phone]

## 2021-03-04 NOTE — Progress Notes (Signed)
Pharmacy Antibiotic Note  Julian Sanchez is a 25 y.o. male admitted on 03/04/2021 with groin abscess.  Pharmacy has been consulted for Vancomycin and Zosyn dosing.   Plan: Zosyn 3.375gm IV Q8h Vancomycin 1500mg  IV now then  1000 mg IV Q 8 hrs. Goal AUC 400-550. Expected AUC: 413 SCr used: 0.8 Will f/u renal function, micro data, and pt's clinical condition Vanc levels prn   Height: 6' (182.9 cm) Weight: 77.1 kg (170 lb) IBW/kg (Calculated) : 77.6  Temp (24hrs), Avg:100.5 F (38.1 C), Min:100.5 F (38.1 C), Max:100.5 F (38.1 C)  Recent Labs  Lab 03/04/21 2150  WBC 10.6*  CREATININE 0.79  LATICACIDVEN 1.1    Estimated Creatinine Clearance: 151.3 mL/min (by C-G formula based on SCr of 0.79 mg/dL).    No Known Allergies  Antimicrobials this admission: 5/24 Vanc >>  5/24 Zosyn >>   Microbiology results: 5/24 BCx:  5/24 R groin wound:    Thank you for allowing pharmacy to be a part of this patient's care.  6/24, PharmD, BCPS Please see amion for complete clinical pharmacist phone list 03/04/2021 11:34 PM

## 2021-03-04 NOTE — ED Triage Notes (Signed)
Pt with abscess to left groin for past 3 days with drainage.  Pt denies any fevers or N/V. More painful today.

## 2021-03-04 NOTE — ED Provider Notes (Signed)
[facility] EMERGENCY DEPARTMENT Provider Note   CSN: [id number] Arrival date & time: 03/04/21  2036     History Chief Complaint  Patient presents with  . Abscess    Julian Sanchez is a 25 y.o. male.  25 year old male with history of HIV (CD4 325 02/2020) presents with complaint of recurrent left side groin abscess.  Patient states this area has been more noticeable and draining for the past 3 to 4 days.  States that this reoccurs every couple of months, has been treated multiple times with I&D.  Patient is febrile on arrival in the ED with temperature of 100.5 otherwise was unaware of fever and has not had chills or sweats today.        Past Medical History:  Diagnosis Date  . ADHD (attention deficit hyperactivity disorder)   . HIV disease (HCC)   . Hx of seasonal allergies   . No pertinent past medical history   . Recurrent tonsillitis   . Wisdom teeth extracted     Patient Active Problem List   Diagnosis Date Noted  . Abscess of buttock, right   . Peritonsillar abscess 07/23/2017  . Homelessness 02/02/2017  . 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 Depression 09/10/2015  . HIV disease (HCC) 08/19/2015  . 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 Cigarette smoker 08/19/2015    Past Surgical History:  Procedure Laterality Date  . IRRIGATION AND DEBRIDEMENT ABSCESS  09/28/2012   Procedure: IRRIGATION AND DEBRIDEMENT ABSCESS;  Surgeon: Flo Shanks, MD;  Location: [facility] OR;  Service: ENT;  Laterality: Right;  Right Peritonsillar Abscess  . MOUTH SURGERY    . TONSILLECTOMY    . TONSILLECTOMY N/A 07/23/2017   Procedure: TONSILLECTOMY;  Surgeon: Christia Reading, MD;  Location: [facility] OR;  Service: ENT;  Laterality: N/A;  . WISDOM TOOTH EXTRACTION         Family History  Problem Relation Age of Onset  . Diabetes Mother   . Asthma Father     Social History   Tobacco Use  . Smoking status: Light Tobacco Smoker    Packs/day: 0.40    Types: Cigarettes  . Smokeless tobacco: Never  Used  Vaping Use  . Vaping Use: Never used  Substance Use Topics  . Alcohol use: No    Alcohol/week: 0.0 standard drinks  . Drug use: Yes    Frequency: 7.0 times per week    Types: Marijuana    Comment: today    Home Medications Prior to Admission medications   Medication Sig Start Date End Date Taking? Authorizing Provider  bictegravir-emtricitabine-tenofovir AF (BIKTARVY) 50-200-25 MG TABS tablet Take 1 tablet by mouth daily. 03/22/19   Cliffton Asters, MD  cyclobenzaprine (FLEXERIL) 5 MG tablet Take 1 tablet (5 mg total) by mouth 3 (three) times daily as needed. Patient not taking: Reported on 03/05/2020 04/23/19   Devoria Albe, MD  HYDROcodone-acetaminophen (NORCO/VICODIN) 5-325 MG tablet Take 1 tablet by mouth every 4 (four) hours as needed. 02/27/20   Elson Areas, PA-C    Allergies    Patient has no known allergies.  Review of Systems   Review of Systems  Constitutional: Positive for fever. Negative for chills and diaphoresis.  Respiratory: Negative for shortness of breath.   Cardiovascular: Negative for chest pain.  Gastrointestinal: Negative for abdominal pain, constipation, diarrhea, nausea and vomiting.  Genitourinary: Positive for scrotal swelling. Negative for difficulty urinating and testicular pain.  Skin: Positive for wound.  Allergic/Immunologic: Positive for immunocompromised state.  Neurological: Negative for weakness.  Hematological: Positive for adenopathy.  All other systems reviewed and are negative.   Physical Exam Updated Vital Signs BP 134/83   Pulse 83   Temp 98.8 F (37.1 C)   Resp 14   Ht 6' (1.829 m)   Wt 77.1 kg   SpO2 100%   BMI 23.06 kg/m   Physical Exam Vitals and nursing note reviewed. Exam conducted with a chaperone present.  Constitutional:      General: He is not in acute distress.    Appearance: He is well-developed. He is not diaphoretic.  HENT:     Head: Normocephalic and atraumatic.  Cardiovascular:     Rate and Rhythm:  Regular rhythm. Tachycardia present.     Heart sounds: Normal heart sounds.  Pulmonary:     Effort: Pulmonary effort is normal.     Breath sounds: Normal breath sounds.  Abdominal:     Palpations: Abdomen is soft.     Tenderness: There is no abdominal tenderness.  Genitourinary:    Comments: Multiple inguinal lymph nodes bilaterally, purulent drainage from left and right inguinal areas extends to perineum Lymphadenopathy:     Lower Body: Right inguinal adenopathy present. Left inguinal adenopathy present.  Skin:    General: Skin is warm and dry.  Neurological:     Mental Status: He is alert and oriented to person, place, and time.  Psychiatric:        Behavior: Behavior normal.     ED Results / Procedures / Treatments   Labs (all labs ordered are listed, but only abnormal results are displayed) Labs Reviewed  COMPREHENSIVE METABOLIC PANEL - Abnormal; Notable for the following components:      Result Value   Sodium 133 (*)    Calcium 8.8 (*)    Total Protein 9.0 (*)    Albumin 2.7 (*)    All other components within normal limits  CBC WITH DIFFERENTIAL/PLATELET - Abnormal; Notable for the following components:   WBC 10.6 (*)    RBC 3.88 (*)    Hemoglobin 10.4 (*)    HCT 31.4 (*)    Platelets 411 (*)    Neutro Abs 8.1 (*)    All other components within normal limits  RESP PANEL BY RT-PCR (FLU A&B, COVID) ARPGX2  CULTURE, BLOOD (ROUTINE X 2)  CULTURE, BLOOD (ROUTINE X 2)  AEROBIC CULTURE W GRAM STAIN (SUPERFICIAL SPECIMEN)  LACTIC ACID, PLASMA    EKG None  Radiology CT PELVIS W CONTRAST  Result Date: 03/04/2021 CLINICAL DATA:  abscess to left groin for past 3 days with drainage. Pt denies any fevers or N/V. More painful today. After IV dye injeciton pt. Rolled onto side and started vomiting. Initial scan delayed due to vomiting. EXAM: CT PELVIS WITH CONTRAST TECHNIQUE: Multidetector CT imaging of the pelvis was performed using the standard protocol following the bolus  administration of intravenous contrast. CONTRAST:  OMNIPAQUE IOHEXOL 300 MG/ML  SOLN COMPARISON:  CT abdomen pelvis 03/05/2020 FINDINGS: Urinary Tract: Partially visualized horseshoe kidney. Otherwise unremarkable. Bowel: The rectal wall is thickened with mucosal hyperemia and limited evaluation for perirectal abscess due to timing of intravenous contrast. Perirectal fat stranding. Vascular/Lymphatic: No pathologically enlarged lymph nodes. No significant vascular abnormality seen. Reproductive:  No mass or other significant abnormality Other: No intraperitoneal free fluid. No intraperitoneal free gas. No organized fluid collection. other Musculoskeletal: Abscess formation with the largest pocket of 1.8 x 1.4 cm centered along the lateral superior left scrotum. There is extension of the abscess posteriorly  along the perineum with a thin elongated 10 x 1 cm fluid collection. There is also extension of the abscess superiorly along the spermatic cord within other pocket of fluid measuring 1.7 x 1.2 cm (2:50). Another tiny pocket of fluid is noted along the medial posterior left thigh measuring 1.1 x 0.6 cm (2:63), this is also noted to be contiguous with the other fluid collections. Associated subcutaneus soft tissue edema and dermal thickening noted. No subcutaneus soft tissue emphysema. No cortical erosion or destruction. No suspicious lytic or blastic osseous lesions. No acute displaced fracture. IMPRESSION: 1. Proctitis.  Underlying malignancy not excluded. 2. Left scrotal abscess formation measuring up to 1.8 x 1.4 cm that is contiguous with a couple of other fluid collections that extend posteriorly toward the perineum, superiorly along the spermatic cord, and laterally to the medial left superior thigh. Associated subcutaneus soft tissue fat stranding and dermal thickening. No associated subcutaneus soft tissue emphysema; however, please note a necrotizing fasciitis cannot be excluded as this is a clinical  diagnosis. Please of poor evaluation of the testes on CT, consider ultrasound for more sensitive evaluation. 3. Incidental horseshoe kidney. Electronically Signed   By: Tish FredericksonMorgane  Naveau M.D.   On: 03/04/2021 23:17   DG Chest Port 1 View  Result Date: 03/04/2021 CLINICAL DATA:  Fever, weakness, groin abscess EXAM: PORTABLE CHEST 1 VIEW COMPARISON:  03/27/2010 FINDINGS: No consolidation, features of edema, pneumothorax, or effusion. Pulmonary vascularity is normally distributed. The cardiomediastinal contours are unremarkable. No acute osseous or soft tissue abnormality. IMPRESSION: No acute cardiopulmonary abnormality. Electronically Signed   By: Kreg ShropshirePrice  DeHay M.D.   On: 03/04/2021 22:08    Procedures .Critical Care Performed by: Jeannie FendMurphy, Nereyda Bowler A, PA-C Authorized by: Jeannie FendMurphy, Lorne Winkels A, PA-C   Critical care provider statement:    Critical care time (minutes):  45   Critical care was time spent personally by me on the following activities:  Discussions with consultants, evaluation of patient's response to treatment, examination of patient, ordering and performing treatments and interventions, ordering and review of laboratory studies, ordering and review of radiographic studies, pulse oximetry, re-evaluation of patient's condition, obtaining history from patient or surrogate and review of old charts     Medications Ordered in ED Medications  vancomycin (VANCOREADY) IVPB 1500 mg/300 mL (1,500 mg Intravenous New Bag/Given 03/04/21 2219)  piperacillin-tazobactam (ZOSYN) IVPB 3.375 g (has no administration in time range)  vancomycin (VANCOCIN) IVPB 1000 mg/200 mL premix (has no administration in time range)  piperacillin-tazobactam (ZOSYN) IVPB 3.375 g (0 g Intravenous Stopped 03/04/21 2301)  acetaminophen (TYLENOL) tablet 650 mg (650 mg Oral Given 03/04/21 2230)  sodium chloride 0.9 % bolus 1,000 mL (1,000 mLs Intravenous New Bag/Given 03/04/21 2230)  iohexol (OMNIPAQUE) 300 MG/ML solution 100 mL (100 mLs  Intravenous Contrast Given 03/04/21 2243)    ED Course  I have reviewed the triage vital signs and the nursing notes.  Pertinent labs & imaging results that were available during my care of the patient were reviewed by me and considered in my medical decision making (see chart for details).  Clinical Course as of 03/04/21 2354  Tue Mar 04, 2021  [id number] 25 year old male with history of HIV who is recently relocated to this area and does not have PCP care presents with complaint of recurrent perineal abscess, worse today with drainage for the past few days.  On arrival febrile with temp of 100.5 with heart rate of 103, blood pressure normal at 128/88 and O2 sat 99% on room  air. Patient is found to have purulent drainage from perineal abscess, does not seem to involve the testicles at this time.  Discussed with Dr. Hyacinth Meeker, ER attending who has seen the patient.  Plan is for sepsis work-up, will order vancomycin and Zosyn, culture of the drainage and order CT with contrast. Discussed with Dr. Henreitta Leber on-call with general surgery, requests COVID test in case patient needs to go to the OR tomorrow, n.p.o. after midnight with hospitalist to admit for IV antibiotics. [LM]  2341 CT shows left scrotal abscess contiguous with fluid collection extending posteriorly toward the perineum and superiorly along spermatic cord as well as laterally to the left medial superior thigh.  Also found to have proctitis.  CBC with mild leukocytosis with white count of 10.6.  CMP without significant findings.  Patient was treated with Zosyn and vancomycin. Hospitalist paged to consult for admission. [LM]    Clinical Course User Index [LM] Alden Hipp   MDM Rules/Calculators/A&P                          Final Clinical Impression(s) / ED Diagnoses Final diagnoses:  Perineal abscess    Rx / DC Orders ED Discharge Orders    None       Alden Hipp 03/04/21 2354    Eber Hong, MD 03/05/21  [phone]

## 2021-03-05 ENCOUNTER — Inpatient Hospital Stay (HOSPITAL_COMMUNITY): Payer: Self-pay | Admitting: Anesthesiology

## 2021-03-05 ENCOUNTER — Encounter (HOSPITAL_COMMUNITY)
Admission: EM | Disposition: A | Discharge status: Home / Self Care | Payer: Self-pay | Source: Home / Self Care | Attending: Internal Medicine

## 2021-03-05 ENCOUNTER — Inpatient Hospital Stay (HOSPITAL_COMMUNITY): Payer: Self-pay

## 2021-03-05 ENCOUNTER — Encounter (HOSPITAL_COMMUNITY): Payer: Self-pay | Admitting: Internal Medicine

## 2021-03-05 DIAGNOSIS — E8809 Other disorders of plasma-protein metabolism, not elsewhere classified: Secondary | ICD-10-CM

## 2021-03-05 DIAGNOSIS — K6289 Other specified diseases of anus and rectum: Secondary | ICD-10-CM

## 2021-03-05 DIAGNOSIS — L02215 Cutaneous abscess of perineum: Secondary | ICD-10-CM

## 2021-03-05 DIAGNOSIS — E46 Unspecified protein-calorie malnutrition: Secondary | ICD-10-CM

## 2021-03-05 DIAGNOSIS — N492 Inflammatory disorders of scrotum: Secondary | ICD-10-CM

## 2021-03-05 DIAGNOSIS — F1290 Cannabis use, unspecified, uncomplicated: Secondary | ICD-10-CM

## 2021-03-05 HISTORY — PX: SCROTAL EXPLORATION: SHX2386

## 2021-03-05 HISTORY — PX: IRRIGATION AND DEBRIDEMENT ABSCESS: SHX5252

## 2021-03-05 LAB — CBC
HCT: 31.8 % — ABNORMAL LOW (ref 39.0–52.0)
Hemoglobin: 9.5 g/dL — ABNORMAL LOW (ref 13.0–17.0)
MCH: 22.8 pg — ABNORMAL LOW (ref 26.0–34.0)
MCHC: 29.9 g/dL — ABNORMAL LOW (ref 30.0–36.0)
MCV: 76.3 fL — ABNORMAL LOW (ref 80.0–100.0)
Platelets: 363 10*3/uL (ref 150–400)
RBC: 4.17 MIL/uL — ABNORMAL LOW (ref 4.22–5.81)
RDW: 14.2 % (ref 11.5–15.5)
WBC: 10.3 10*3/uL (ref 4.0–10.5)
nRBC: 0 % (ref 0.0–0.2)

## 2021-03-05 LAB — PROTIME-INR
INR: 1.2 (ref 0.8–1.2)
Prothrombin Time: 15.2 seconds (ref 11.4–15.2)

## 2021-03-05 LAB — PHOSPHORUS: Phosphorus: 4 mg/dL (ref 2.5–4.6)

## 2021-03-05 LAB — COMPREHENSIVE METABOLIC PANEL
ALT: 9 U/L (ref 0–44)
AST: 12 U/L — ABNORMAL LOW (ref 15–41)
Albumin: 2.3 g/dL — ABNORMAL LOW (ref 3.5–5.0)
Alkaline Phosphatase: 62 U/L (ref 38–126)
Anion gap: 6 (ref 5–15)
BUN: 6 mg/dL (ref 6–20)
CO2: 25 mmol/L (ref 22–32)
Calcium: 8.4 mg/dL — ABNORMAL LOW (ref 8.9–10.3)
Chloride: 101 mmol/L (ref 98–111)
Creatinine, Ser: 0.76 mg/dL (ref 0.61–1.24)
GFR, Estimated: >60 mL/min (ref >60)
Glucose, Bld: 88 mg/dL (ref 70–99)
Potassium: 3.7 mmol/L (ref 3.5–5.1)
Sodium: 132 mmol/L — ABNORMAL LOW (ref 135–145)
Total Bilirubin: 0.4 mg/dL (ref 0.3–1.2)
Total Protein: 8.2 g/dL — ABNORMAL HIGH (ref 6.5–8.1)

## 2021-03-05 LAB — MRSA PCR SCREENING: MRSA by PCR: POSITIVE — AB

## 2021-03-05 LAB — MAGNESIUM: Magnesium: 1.7 mg/dL (ref 1.7–2.4)

## 2021-03-05 LAB — APTT: aPTT: 34 seconds (ref 24–36)

## 2021-03-05 SURGERY — INCISION AND DRAINAGE, ABSCESS, PERIRECTAL
Anesthesia: General

## 2021-03-05 SURGERY — IRRIGATION AND DEBRIDEMENT ABSCESS
Anesthesia: General | Site: Scrotum

## 2021-03-05 MED ORDER — FENTANYL CITRATE (PF) 100 MCG/2ML IJ SOLN
INTRAMUSCULAR | Status: AC
  Filled 2021-03-05: qty 2

## 2021-03-05 MED ORDER — MEPERIDINE HCL 50 MG/ML IJ SOLN: 6.2500 mg | INTRAMUSCULAR | Status: DC | PRN

## 2021-03-05 MED ORDER — MIDAZOLAM HCL 5 MG/5ML IJ SOLN
INTRAMUSCULAR | Status: DC | PRN
  Administered 2021-03-05: 2 mg via INTRAVENOUS

## 2021-03-05 MED ORDER — BICTEGRAVIR-EMTRICITAB-TENOFOV 50-200-25 MG PO TABS
1.0000 | ORAL_TABLET | Freq: Every day | ORAL | Status: DC
  Administered 2021-03-05 – 2021-03-09 (×4): 1 via ORAL
  Filled 2021-03-05 (×7): qty 1

## 2021-03-05 MED ORDER — MIDAZOLAM HCL 2 MG/2ML IJ SOLN
INTRAMUSCULAR | Status: AC
  Filled 2021-03-05: qty 2

## 2021-03-05 MED ORDER — ENSURE ENLIVE PO LIQD
237.0000 mL | Freq: Two times a day (BID) | ORAL | Status: DC
  Administered 2021-03-09: 237 mL via ORAL

## 2021-03-05 MED ORDER — HYDROMORPHONE HCL 1 MG/ML IJ SOLN
INTRAMUSCULAR | Status: AC
  Filled 2021-03-05: qty 1

## 2021-03-05 MED ORDER — PROPOFOL 10 MG/ML IV BOLUS
INTRAVENOUS | Status: DC | PRN
  Administered 2021-03-05: 200 mg via INTRAVENOUS

## 2021-03-05 MED ORDER — ROCURONIUM BROMIDE 10 MG/ML (PF) SYRINGE
PREFILLED_SYRINGE | INTRAVENOUS | Status: DC | PRN
  Administered 2021-03-05: 40 mg via INTRAVENOUS

## 2021-03-05 MED ORDER — DEXAMETHASONE SODIUM PHOSPHATE 10 MG/ML IJ SOLN
INTRAMUSCULAR | Status: DC | PRN
  Administered 2021-03-05: 10 mg via INTRAVENOUS

## 2021-03-05 MED ORDER — MORPHINE SULFATE (PF) 2 MG/ML IV SOLN
2.0000 mg | INTRAVENOUS | Status: DC | PRN
Start: 2021-03-05 — End: 2021-03-06
  Administered 2021-03-05 – 2021-03-06 (×6): 2 mg via INTRAVENOUS
  Filled 2021-03-05 (×6): qty 1

## 2021-03-05 MED ORDER — MIDAZOLAM HCL 2 MG/2ML IJ SOLN: 0.5000 mg | Freq: Once | INTRAMUSCULAR | Status: DC | PRN

## 2021-03-05 MED ORDER — DEXMEDETOMIDINE (PRECEDEX) IN NS 20 MCG/5ML (4 MCG/ML) IV SYRINGE
PREFILLED_SYRINGE | INTRAVENOUS | Status: DC | PRN
  Administered 2021-03-05: 12 ug via INTRAVENOUS

## 2021-03-05 MED ORDER — LIDOCAINE 2% (20 MG/ML) 5 ML SYRINGE
INTRAMUSCULAR | Status: DC | PRN
  Administered 2021-03-05: 100 mg via INTRAVENOUS

## 2021-03-05 MED ORDER — CLINDAMYCIN PHOSPHATE 600 MG/50ML IV SOLN
600.0000 mg | Freq: Three times a day (TID) | INTRAVENOUS | Status: DC
  Administered 2021-03-05 – 2021-03-07 (×6): 600 mg via INTRAVENOUS
  Filled 2021-03-05 (×7): qty 50

## 2021-03-05 MED ORDER — ONDANSETRON HCL 4 MG/2ML IJ SOLN
INTRAMUSCULAR | Status: DC | PRN
  Administered 2021-03-05: 4 mg via INTRAVENOUS

## 2021-03-05 MED ORDER — OXYCODONE HCL 5 MG/5ML PO SOLN
5.0000 mg | Freq: Once | ORAL | Status: DC | PRN
Start: 2021-03-05 — End: 2021-03-05

## 2021-03-05 MED ORDER — HYDROMORPHONE HCL 1 MG/ML IJ SOLN
0.2500 mg | INTRAMUSCULAR | Status: DC | PRN
  Administered 2021-03-05 (×2): 0.5 mg via INTRAVENOUS

## 2021-03-05 MED ORDER — SUCCINYLCHOLINE CHLORIDE 200 MG/10ML IV SOSY
PREFILLED_SYRINGE | INTRAVENOUS | Status: DC | PRN
  Administered 2021-03-05: 160 mg via INTRAVENOUS

## 2021-03-05 MED ORDER — ACETAMINOPHEN 325 MG PO TABS: 650.0000 mg | ORAL_TABLET | Freq: Four times a day (QID) | ORAL | Status: DC | PRN

## 2021-03-05 MED ORDER — CHLORHEXIDINE GLUCONATE 0.12 % MT SOLN
15.0000 mL | OROMUCOSAL | Status: AC
  Administered 2021-03-05: 15 mL via OROMUCOSAL

## 2021-03-05 MED ORDER — OXYCODONE HCL 5 MG PO TABS: 5.0000 mg | ORAL_TABLET | Freq: Once | ORAL | Status: DC | PRN

## 2021-03-05 MED ORDER — LACTATED RINGERS IV SOLN: INTRAVENOUS | Status: DC

## 2021-03-05 MED ORDER — FENTANYL CITRATE (PF) 100 MCG/2ML IJ SOLN
INTRAMUSCULAR | Status: DC | PRN
  Administered 2021-03-05 (×2): 50 ug via INTRAVENOUS
  Administered 2021-03-05: 100 ug via INTRAVENOUS

## 2021-03-05 MED ORDER — PROMETHAZINE HCL 25 MG/ML IJ SOLN: 6.2500 mg | INTRAMUSCULAR | Status: DC | PRN

## 2021-03-05 MED ORDER — 0.9 % SODIUM CHLORIDE (POUR BTL) OPTIME
TOPICAL | Status: DC | PRN
  Administered 2021-03-05: 2000 mL

## 2021-03-05 SURGICAL SUPPLY — 54 items
ADH SKN CLS APL DERMABOND .7 (GAUZE/BANDAGES/DRESSINGS)
APL SKNCLS STERI-STRIP NONHPOA (GAUZE/BANDAGES/DRESSINGS)
BENZOIN TINCTURE PRP APPL 2/3 (GAUZE/BANDAGES/DRESSINGS) IMPLANT
BLADE CLIPPER SURG (BLADE) ×1 IMPLANT
BLADE HEX COATED 2.75 (ELECTRODE) ×2 IMPLANT
BLADE SURG 15 STRL LF DISP TIS (BLADE) ×2 IMPLANT
BLADE SURG 15 STRL SS (BLADE) ×4
BLADE SURG SZ10 CARB STEEL (BLADE) ×4 IMPLANT
BNDG GAUZE ELAST 4 BULKY (GAUZE/BANDAGES/DRESSINGS) ×2 IMPLANT
COVER SURGICAL LIGHT HANDLE (MISCELLANEOUS) ×2 IMPLANT
COVER WAND RF STERILE (DRAPES) IMPLANT
DECANTER SPIKE VIAL GLASS SM (MISCELLANEOUS) IMPLANT
DERMABOND ADVANCED (GAUZE/BANDAGES/DRESSINGS)
DERMABOND ADVANCED .7 DNX12 (GAUZE/BANDAGES/DRESSINGS) ×1 IMPLANT
DRAIN PENROSE 0.25X18 (DRAIN) IMPLANT
DRAPE LAPAROSCOPIC ABDOMINAL (DRAPES) IMPLANT
DRAPE LAPAROTOMY T 102X78X121 (DRAPES) IMPLANT
DRAPE LAPAROTOMY T 98X78 PEDS (DRAPES) ×1 IMPLANT
DRAPE LAPAROTOMY TRNSV 102X78 (DRAPES) IMPLANT
DRAPE POUCH INSTRU U-SHP 10X18 (DRAPES) ×1 IMPLANT
DRAPE SHEET LG 3/4 BI-LAMINATE (DRAPES) IMPLANT
ELECT REM PT RETURN 15FT ADLT (MISCELLANEOUS) ×4 IMPLANT
EVACUATOR SILICONE 100CC (DRAIN) IMPLANT
GAUZE SPONGE 4X4 12PLY STRL (GAUZE/BANDAGES/DRESSINGS) ×2 IMPLANT
GLOVE SURG ENC MOIS LTX SZ7.5 (GLOVE) ×4 IMPLANT
GLOVE SURG UNDER POLY LF SZ7 (GLOVE) ×2 IMPLANT
GOWN STRL REUS W/TWL LRG LVL3 (GOWN DISPOSABLE) ×2 IMPLANT
GOWN STRL REUS W/TWL XL LVL3 (GOWN DISPOSABLE) ×4 IMPLANT
KIT BASIN OR (CUSTOM PROCEDURE TRAY) ×4 IMPLANT
KIT TURNOVER KIT A (KITS) ×2 IMPLANT
NDL HYPO 25X1 1.5 SAFETY (NEEDLE) ×2 IMPLANT
NEEDLE HYPO 22GX1.5 SAFETY (NEEDLE) IMPLANT
NEEDLE HYPO 25X1 1.5 SAFETY (NEEDLE) IMPLANT
NS IRRIG 1000ML POUR BTL (IV SOLUTION) ×4 IMPLANT
PACK BASIC VI WITH GOWN DISP (CUSTOM PROCEDURE TRAY) ×4 IMPLANT
PACK GENERAL/GYN (CUSTOM PROCEDURE TRAY) ×2 IMPLANT
PENCIL SMOKE EVACUATOR (MISCELLANEOUS) IMPLANT
SHEET LAVH (DRAPES) ×1 IMPLANT
SOL PREP POV-IOD 4OZ 10% (MISCELLANEOUS) ×2 IMPLANT
SPONGE LAP 18X18 RF (DISPOSABLE) ×1 IMPLANT
SPONGE LAP 4X18 RFD (DISPOSABLE) ×2 IMPLANT
STAPLER VISISTAT 35W (STAPLE) IMPLANT
STRIP CLOSURE SKIN 1/2X4 (GAUZE/BANDAGES/DRESSINGS) IMPLANT
SUPPORT SCROTAL LG STRP (MISCELLANEOUS) ×1 IMPLANT
SUT CHROMIC 3 0 SH 27 (SUTURE) ×3 IMPLANT
SUT MNCRL AB 4-0 PS2 18 (SUTURE) IMPLANT
SUT PDS AB 4-0 SH 27 (SUTURE) ×1 IMPLANT
SUT VIC AB 0 UR5 27 (SUTURE) ×1 IMPLANT
SUT VIC AB 2-0 UR5 27 (SUTURE) IMPLANT
SUT VIC AB 3-0 SH 18 (SUTURE) IMPLANT
SUT VICRYL 0 TIES 12 18 (SUTURE) IMPLANT
SYR CONTROL 10ML LL (SYRINGE) ×2 IMPLANT
TOWEL OR 17X26 10 PK STRL BLUE (TOWEL DISPOSABLE) ×2 IMPLANT
WATER STERILE IRR 1000ML POUR (IV SOLUTION) ×2 IMPLANT

## 2021-03-05 NOTE — Consult Note (Addendum)
[facility] Consult  Reason for Consult: Scrotal abscess/ Possible necrotizing infection  Referring Physician: Dr. Laural BenesJohnson   Chief Complaint    Abscess      HPI: Julian Sanchez is a 25 y.o. male with HIV, ADHD who is non- complaint and reports a several week history of purulent drainage and pain from the left groin area. He says that it has become work and I was actually called by the ED last night telling me he had a perirectal abscess. They had not yet done imaging or labs.  I told them to make him NPO and do a COVID test pending any need for drainage. The CT was done overnight and he was admitted to the hospitalist service. I noted the CT this Am with evidence of a scrotal abscess with extension along the spermatic cord and proctitis There was not gas on this and I spoke with Dr. Laural BenesJohnson recommending Urology consult.  This was done but unfortunately Dr. Ronne BinningMcKenzie was out of town and Dr. Laural BenesJohnson was told there was [facility]. I discussed that I would see patient and try to drain an abscess if needed after I completed a case.  Unfortunately that case was very long, and once I was out an US had been completed Dr. Tyron RussellBoles notified me that he had concern for a necrotizing infection given the pain the patient had and thickening of the skin and possibility of forming gas.    Given that we could not rule out necrotizing infection, I posted the patient for a debridement. In the mean time, Dr. Ronne BinningMcKenzie who is on vacation noted messaging from this AM and reached out to his partner, Dr. Alvester MorinBell who wants to bring the patient down to [facility] for debridement and evaluation.   The patient was also noted to have proctitis on CT. He says he has had some rectal bleeding at times but denies any mucus or history of Crohn's or IBD.    Past Medical History:  Diagnosis Date  . ADHD (attention deficit hyperactivity disorder)   . HIV disease (HCC)   . Hx of seasonal allergies    . No pertinent past medical history   . Recurrent tonsillitis   . Wisdom teeth extracted     Past Surgical History:  Procedure Laterality Date  . IRRIGATION AND DEBRIDEMENT ABSCESS  09/28/2012   Procedure: IRRIGATION AND DEBRIDEMENT ABSCESS;  Surgeon: Flo ShanksKarol Wolicki, MD;  [facility];  Service: ENT;  Laterality: Right;  Right Peritonsillar Abscess  . MOUTH SURGERY    . TONSILLECTOMY    . TONSILLECTOMY N/A 07/23/2017   Procedure: TONSILLECTOMY;  Surgeon: Christia ReadingBates, Dwight, MD;  [facility];  Service: ENT;  Laterality: N/A;  . WISDOM TOOTH EXTRACTION      Family History  Problem Relation Age of Onset  . Diabetes Mother   . Asthma Father     Social History   Tobacco Use  . Smoking status: Light Tobacco Smoker    Packs/day: 0.40    Types: Cigarettes  . Smokeless tobacco: Never Used  Vaping Use  . Vaping Use: Never used  Substance Use Topics  . Alcohol use: No    Alcohol/week: 0.0 standard drinks  . Drug use: Yes    Frequency: 7.0 times per week    Types: Marijuana    Comment: today    Medications:  I have reviewed the patient's current medications. Prior to Admission:  Medications Prior to Admission  Medication Sig Dispense  Refill Last Dose  . bictegravir-emtricitabine-tenofovir AF (BIKTARVY) 50-200-25 MG TABS tablet Take 1 tablet by mouth daily. 30 tablet 11 03/04/2021 at Unknown time   Scheduled: . bictegravir-emtricitabine-tenofovir AF  1 tablet Oral Daily  . feeding supplement  237 mL Oral BID BM   Continuous: . piperacillin-tazobactam (ZOSYN)  IV 3.375 g (03/05/21 1454)  . vancomycin 1,000 mg (03/05/21 0536)   ZOX:[id number], morphine injection  No Known Allergies   ROS:  A comprehensive review of systems was negative except for: Gastrointestinal: positive for rectal bleeding Genitourinary: positive for painful, swollen left groin and scrotum with purulent drainage  Blood pressure 129/83, pulse 77, temperature 98.3 F (36.8 C), temperature  source Oral, resp. rate 20, height 6' (1.829 m), weight 77.1 kg, SpO2 100 %. Physical Exam Vitals reviewed.  Constitutional:      Appearance: Normal appearance.  HENT:     Head: Normocephalic.     Nose: Nose normal.  Eyes:     Extraocular Movements: Extraocular movements intact.  Cardiovascular:     Rate and Rhythm: Normal rate.  Pulmonary:     Effort: Pulmonary effort is normal.  Abdominal:     General: There is no distension.     Palpations: Abdomen is soft.     Tenderness: There is no abdominal tenderness.  Genitourinary:    Penis: Normal.      Comments: Left scrotum and groin/ inguinal region with swelling, tender out of proportion to exam, no bullae or skin changes noted, gel from the [place] and purulent drainage Musculoskeletal:        General: Normal range of motion.  Skin:    General: Skin is warm.  Neurological:     General: No focal deficit present.     Mental Status: He is alert and oriented to person, place, and time.  Psychiatric:        Mood and Affect: Mood normal.        Behavior: Behavior normal.        Thought Content: Thought content normal.        Judgment: Judgment normal.     Results: Results for orders placed or performed during the hospital encounter of 03/04/21 (from the past 48 hour(s))  Culture, blood (routine x 2)     Status: None (Preliminary result)   Collection Time: 03/04/21  9:08 PM   Specimen: BLOOD  Result Value Ref Range   Specimen Description BLOOD LEFT ANTECUBITAL    Special Requests      BOTTLES DRAWN AEROBIC AND ANAEROBIC Blood Culture adequate volume   Culture      NO GROWTH < 12 HOURS Performed at [facility], [address] [place], [place] [address]    Report Status PENDING   Comprehensive metabolic panel     Status: Abnormal   Collection Time: 03/04/21  9:50 PM  Result Value Ref Range   Sodium 133 (L) 135 - 145 mmol/L   Potassium 3.5 3.5 - 5.1 mmol/L   Chloride 100 98 - 111 mmol/L   CO2 26 22 - 32 mmol/L   Glucose, Bld  86 70 - 99 mg/dL    Comment: Glucose reference range applies only to samples taken after fasting for at least 8 hours.   BUN 6 6 - 20 mg/dL   Creatinine, Ser 9.56 0.61 - 1.24 mg/dL   Calcium 8.8 (L) 8.9 - 10.3 mg/dL   Total Protein 9.0 (H) 6.5 - 8.1 g/dL   Albumin 2.7 (L) 3.5 - 5.0 g/dL  AST 16 15 - 41 U/L   ALT 11 0 - 44 U/L   Alkaline Phosphatase 73 38 - 126 U/L   Total Bilirubin 0.4 0.3 - 1.2 mg/dL   GFR, Estimated >76 >72 mL/min    Comment: (NOTE) Calculated using the CKD-EPI Creatinine Equation (2021)    Anion gap 7 5 - 15    Comment: Performed at [facility], [address], [place], [place] [address]  CBC with Differential     Status: Abnormal   Collection Time: 03/04/21  9:50 PM  Result Value Ref Range   WBC 10.6 (H) 4.0 - 10.5 K/uL   RBC 3.88 (L) 4.22 - 5.81 MIL/uL   Hemoglobin 10.4 (L) 13.0 - 17.0 g/dL   HCT 96.2 (L) 83.6 - 62.9 %   MCV 80.9 80.0 - 100.0 fL   MCH 26.8 26.0 - 34.0 pg   MCHC 33.1 30.0 - 36.0 g/dL   RDW 47.6 54.6 - 50.3 %   Platelets 411 (H) 150 - 400 K/uL   nRBC 0.0 0.0 - 0.2 %   Neutrophils Relative % 76 %   Neutro Abs 8.1 (H) 1.7 - 7.7 K/uL   Lymphocytes Relative 16 %   Lymphs Abs 1.7 0.7 - 4.0 K/uL   Monocytes Relative 7 %   Monocytes Absolute 0.7 0.1 - 1.0 K/uL   Eosinophils Relative 0 %   Eosinophils Absolute 0.0 0.0 - 0.5 K/uL   Basophils Relative 0 %   Basophils Absolute 0.0 0.0 - 0.1 K/uL   Immature Granulocytes 1 %   Abs Immature Granulocytes 0.06 0.00 - 0.07 K/uL    Comment: Performed at [facility], [address], [place], [place] [address]  Lactic acid, plasma     Status: None   Collection Time: 03/04/21  9:50 PM  Result Value Ref Range   Lactic Acid, Venous 1.1 0.5 - 1.9 mmol/L    Comment: Performed at [facility], [address] [place], [place] [address]  Culture, blood (routine x 2)     Status: None (Preliminary result)   Collection Time: 03/04/21  9:50 PM   Specimen: BLOOD  Result Value Ref Range   Specimen Description  BLOOD RIGHT ANTECUBITAL    Special Requests      BOTTLES DRAWN AEROBIC AND ANAEROBIC Blood Culture adequate volume   Culture      NO GROWTH < 12 HOURS Performed at [facility], [address], [place], [place] [address]    Report Status PENDING   Resp Panel by RT-PCR (Flu A&B, Covid) Groin, Right     Status: None   Collection Time: 03/04/21 10:01 PM   Specimen: Groin, Right; Nasopharyngeal(NP) swabs in vial transport medium  Result Value Ref Range   SARS Coronavirus 2 by RT PCR NEGATIVE NEGATIVE    Comment: (NOTE) SARS-CoV-2 target nucleic acids are NOT DETECTED.  The SARS-CoV-2 RNA is generally detectable in upper respiratory specimens during the acute phase of infection. The lowest concentration of SARS-CoV-2 viral copies this assay can detect is 138 copies/mL. A negative result does not preclude SARS-Cov-2 infection and should not be used as the sole basis for treatment or other patient management decisions. A negative result may occur with  improper specimen collection/handling, submission of specimen other than nasopharyngeal swab, presence of viral mutation(s) within the areas targeted by this assay, and inadequate number of viral copies(<138 copies/mL). A negative result must be combined with clinical observations, patient history, and epidemiological information. The expected result is Negative.  Fact  Sheet for Patients:  BloggerCourse.com  Fact Sheet for Healthcare Providers:  SeriousBroker.it  This test is no t yet approved or cleared by the [place] FDA and  has been authorized for detection and/or diagnosis of SARS-CoV-2 by FDA under an Emergency Use Authorization (EUA). This EUA will remain  in effect (meaning this test can be used) for the duration of the COVID-19 declaration under Section 564(b)(1) of the Act, 21 U.S.C.section 360bbb-3(b)(1), unless the authorization is terminated  or revoked sooner.        Influenza A by PCR NEGATIVE NEGATIVE   Influenza B by PCR NEGATIVE NEGATIVE    Comment: (NOTE) The Xpert Xpress SARS-CoV-2/FLU/RSV plus assay is intended as an aid in the diagnosis of influenza from Nasopharyngeal swab specimens and should not be used as a sole basis for treatment. Nasal washings and aspirates are unacceptable for Xpert Xpress SARS-CoV-2/FLU/RSV testing.  Fact Sheet for Patients: BloggerCourse.com  Fact Sheet for Healthcare Providers: SeriousBroker.it  This test is not yet approved or cleared by the [place] FDA and has been authorized for detection and/or diagnosis of SARS-CoV-2 by FDA under an Emergency Use Authorization (EUA). This EUA will remain in effect (meaning this test can be used) for the duration of the COVID-19 declaration under Section 564(b)(1) of the Act, 21 U.S.C. section 360bbb-3(b)(1), unless the authorization is terminated or revoked.  Performed at [facility], [address], [place], [place] [address]   Magnesium     Status: None   Collection Time: 03/05/21  5:59 AM  Result Value Ref Range   Magnesium 1.7 1.7 - 2.4 mg/dL    Comment: Performed at [facility], [address], [place], [place] [address]  Phosphorus     Status: None   Collection Time: 03/05/21  5:59 AM  Result Value Ref Range   Phosphorus 4.0 2.5 - 4.6 mg/dL    Comment: Performed at [facility], [address], [place], [place] [address]  Comprehensive metabolic panel     Status: Abnormal   Collection Time: 03/05/21  5:59 AM  Result Value Ref Range   Sodium 132 (L) 135 - 145 mmol/L   Potassium 3.7 3.5 - 5.1 mmol/L   Chloride 101 98 - 111 mmol/L   CO2 25 22 - 32 mmol/L   Glucose, Bld 88 70 - 99 mg/dL    Comment: Glucose reference range applies only to samples taken after fasting for at least 8 hours.   BUN 6 6 - 20 mg/dL   Creatinine, Ser 4.48 0.61 - 1.24 mg/dL   Calcium 8.4 (L) 8.9 - 10.3 mg/dL   Total Protein  8.2 (H) 6.5 - 8.1 g/dL   Albumin 2.3 (L) 3.5 - 5.0 g/dL   AST 12 (L) 15 - 41 U/L   ALT 9 0 - 44 U/L   Alkaline Phosphatase 62 38 - 126 U/L   Total Bilirubin 0.4 0.3 - 1.2 mg/dL   GFR, Estimated >18 >56 mL/min    Comment: (NOTE) Calculated using the CKD-EPI Creatinine Equation (2021)    Anion gap 6 5 - 15    Comment: Performed at [facility], [address] [place], [place] [address]  CBC     Status: Abnormal   Collection Time: 03/05/21  5:59 AM  Result Value Ref Range   WBC 10.3 4.0 - 10.5 K/uL   RBC 4.17 (L) 4.22 - 5.81 MIL/uL   Hemoglobin 9.5 (L) 13.0 - 17.0 g/dL   HCT 02.6 (L) 37.8 - 58.8 %  MCV 76.3 (L) 80.0 - 100.0 fL   MCH 22.8 (L) 26.0 - 34.0 pg   MCHC 29.9 (L) 30.0 - 36.0 g/dL   RDW 96.0 45.4 - 09.8 %   Platelets 363 150 - 400 K/uL   nRBC 0.0 0.0 - 0.2 %    Comment: Performed at [facility], [address] [place], [place] [address]  Protime-INR     Status: None   Collection Time: 03/05/21  5:59 AM  Result Value Ref Range   Prothrombin Time 15.2 11.4 - 15.2 seconds   INR 1.2 0.8 - 1.2    Comment: (NOTE) INR goal varies based on device and disease states. Performed at [facility], [address], [place], [place] [address]   APTT     Status: None   Collection Time: 03/05/21  5:59 AM  Result Value Ref Range   aPTT 34 24 - 36 seconds    Comment: Performed at [facility], [address], [place], [place] [address]  MRSA PCR Screening     Status: Abnormal   Collection Time: 03/05/21  8:36 AM   Specimen: Nasopharyngeal  Result Value Ref Range   MRSA by PCR POSITIVE (A) NEGATIVE    Comment:        The GeneXpert MRSA Assay (FDA approved for NASAL specimens only), is one component of a comprehensive MRSA colonization surveillance program. It is not intended to diagnose MRSA infection nor to guide or monitor treatment for MRSA infections. RESULT CALLED TO, READ BACK BY AND VERIFIED WITH: WATSON,K AT 1405 BY HUFFINES,S ON 03/05/21. Performed at [facility], [address] [place], [place] [address]     CT PELVIS W CONTRAST  Result Date: 03/04/2021 CLINICAL DATA:  abscess to left groin for past 3 days with drainage. Pt denies any fevers or N/V. More painful today. After IV dye injeciton pt. Rolled onto side and started vomiting. Initial scan delayed due to vomiting. EXAM: CT PELVIS WITH CONTRAST TECHNIQUE: Multidetector CT imaging of the pelvis was performed using the standard protocol following the bolus administration of intravenous contrast. CONTRAST:  OMNIPAQUE IOHEXOL 300 MG/ML  SOLN COMPARISON:  CT abdomen pelvis 03/05/2020 FINDINGS: Urinary Tract: Partially visualized horseshoe kidney. Otherwise unremarkable. Bowel: The rectal wall is thickened with mucosal hyperemia and limited evaluation for perirectal abscess due to timing of intravenous contrast. Perirectal fat stranding. Vascular/Lymphatic: No pathologically enlarged lymph nodes. No significant vascular abnormality seen. Reproductive:  No mass or other significant abnormality Other: No intraperitoneal free fluid. No intraperitoneal free gas. No organized fluid collection. other Musculoskeletal: Abscess formation with the largest pocket of 1.8 x 1.4 cm centered along the lateral superior left scrotum. There is extension of the abscess posteriorly along the perineum with a thin elongated 10 x 1 cm fluid collection. There is also extension of the abscess superiorly along the spermatic cord within other pocket of fluid measuring 1.7 x 1.2 cm (2:50). Another tiny pocket of fluid is noted along the medial posterior left thigh measuring 1.1 x 0.6 cm (2:63), this is also noted to be contiguous with the other fluid collections. Associated subcutaneus soft tissue edema and dermal thickening noted. No subcutaneus soft tissue emphysema. No cortical erosion or destruction. No suspicious lytic or blastic osseous lesions. No acute displaced fracture. IMPRESSION: 1. Proctitis.  Underlying malignancy not  excluded. 2. Left scrotal abscess formation measuring up to 1.8 x 1.4 cm that is contiguous with a couple of other fluid collections that extend posteriorly toward the perineum, superiorly  along the spermatic cord, and laterally to the medial left superior thigh. Associated subcutaneus soft tissue fat stranding and dermal thickening. No associated subcutaneus soft tissue emphysema; however, please note a necrotizing fasciitis cannot be excluded as this is a clinical diagnosis. Please of poor evaluation of the testes on CT, consider ultrasound for more sensitive evaluation. 3. Incidental horseshoe kidney. Electronically Signed   By: Tish Frederickson M.D.   On: 03/04/2021 23:17   DG Chest Port 1 View  Result Date: 03/04/2021 CLINICAL DATA:  Fever, weakness, groin abscess EXAM: PORTABLE CHEST 1 VIEW COMPARISON:  03/27/2010 FINDINGS: No consolidation, features of edema, pneumothorax, or effusion. Pulmonary vascularity is normally distributed. The cardiomediastinal contours are unremarkable. No acute osseous or soft tissue abnormality. IMPRESSION: No acute cardiopulmonary abnormality. Electronically Signed   By: Kreg Shropshire M.D.   On: 03/04/2021 22:08   US SCROTUM W/DOPPLER  Addendum Date: 03/05/2021   ADDENDUM REPORT: 03/05/2021 13:15 ADDENDUM: Discussed with Dr. Henreitta Leber on 03/05/2021 at 1315 hours. Electronically Signed   By: Ulyses Southward M.D.   On: 03/05/2021 13:15   Result Date: 03/05/2021 CLINICAL DATA:  LEFT scrotal abscess, symptoms for 3 months EXAM: SCROTAL ULTRASOUND DOPPLER ULTRASOUND OF THE TESTICLES TECHNIQUE: Complete ultrasound examination of the testicles, epididymis, and other scrotal structures was performed. Color and spectral Doppler ultrasound were also utilized to evaluate blood flow to the testicles. COMPARISON:  CT pelvis 03/04/2021 FINDINGS: Right testicle Measurements: 3.7 x 1.9 x 2.5 cm. Normal echogenicity without mass or calcification. Internal blood flow present on color Doppler  imaging. Left testicle Measurements: 3.41.5 x 2.5 cm. Normal echogenicity without mass or calcification. Internal blood flow present on color Doppler imaging. Right epididymis:  Normal in size and appearance. Left epididymis: Normal in size and appearance. Slightly hypervascular. Hydrocele:  Trace RIGHT hydrocele fluid Varicocele:  None visualized. Pulsed Doppler interrogation of both testes demonstrates normal low resistance arterial and venous waveforms bilaterally. Extensive soft tissue swelling of LEFT hemiscrotum extending into LEFT groin and LEFT perineum. Multiple loculated fluid collections are seen consistent with abscesses. A more anterior superior abscess measures 1.6 cm. A second collection extending posteriorly towards the perineum and anus measures 10 mm diameter and 3.3 cm length. A third more posterior collection measures 1.3 cm diameter. A few scattered foci of bright punctate hyperechogenicity are seen, particularly within the small abscess collections, without definite shadowing; these are concerning for developing foci of soft tissue gas/pneumatosis. Reactive LEFT inguinal lymph nodes identified IMPRESSION: Normal appearing testes and epididymi. Significant soft tissue swelling LEFT hemiscrotum extending into LEFT groin and posteriorly into perineum to perianal region. Focal complex collections are seen within the area of soft tissue edema consistent with abscesses, with 3 discrete collections measuring 1.6 cm, 3.3 cm, and 1.3 cm in greatest sizes. Both of these collections contain foci of punctate hyperechogenicity concerning for gas. 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 Critical Value/emergent results were called by telephone at the time of interpretation on 03/05/2021 at 12:33 pm to provider [facility] , who verbally acknowledged these results. Electronically Signed: By: Ulyses Southward M.D. On: 03/05/2021 12:36     Assessment & Plan:  Julian Sanchez is a 25 y.o. male  with left scrotum/ groin abscess versus necrotizing infection based on recent US.  I had seen him and discussed debridement and potential need for additional debridement, transfer, and longterm wound care/ grafts etc.  Discussed risk of bleeding, infection, loss of tissue, etc.  At this time he is going to  be transferred to [facility] for Urology to evaluate him. Dr. Laural Benes is arranging this and is in communication with Dr. Alvester Morin. I have spoken with Dr. Ronne Binning who had reached out to [facility] regarding the patient.   Case at [facility], Transfer initiated  Given proctitis and rectal bleeding will need evaluation by GI at some point  Broad spectrum antibiotics and can add Clindamycin given concern for necrotizing infection  Discussed with Dr. Laural Benes.   All questions were answered to the satisfaction of the patient and his mother over the phone.    Lucretia Roers 03/05/2021, 2:12 PM

## 2021-03-05 NOTE — Op Note (Signed)
03/04/2021 - 03/05/2021  7:32 PM  PATIENT:  Julian Sanchez  25 y.o. male  PRE-OPERATIVE DIAGNOSIS:  FOURNIER'S GANGRENE SCROTUM/THIGH  POST-OPERATIVE DIAGNOSIS:  FOURNIER'S GANGRENE SCROTUM/THIGH  PROCEDURE:  Procedure(s): Exam under anesthesia, rectal biopsy  SURGEON:  Surgeon(s) and Role:     Griselda Miner, MD - Assisting  PHYSICIAN ASSISTANT:   ASSISTANTS: none   ANESTHESIA:   general  EBL:  minimal   BLOOD ADMINISTERED:none  DRAINS: none   LOCAL MEDICATIONS USED:  NONE  SPECIMEN:  Source of Specimen:  rectal mass  DISPOSITION OF SPECIMEN:  PATHOLOGY  COUNTS:  YES  TOURNIQUET:  * No tourniquets in log *  DICTATION: .Dragon Dictation   The patient was in the operating room with urology for drainage of a left scrotal abscess.  During the operation a digital exam of the rectum was performed and pus was encountered.  We were consulted intraoperatively for evaluation of this.  On digital exam of the rectum as far as I could feel was friable with multiple small indurated areas. There did not appear to be a communication from the open wound in the left scrotum and thigh to the deep rectum. I took a small biopsy sharply with the electrocautery from one of the larger indurated areas. There did not appear to be any drainable perirectal fluid collection on review of the CT pelvis. There was no obvious drainable fluid collection on anoscopic exam. The patient will remain on antibiotics and the case will be discussed with our colorectal surgeons in the am.   PLAN OF CARE: Admit to inpatient   PATIENT DISPOSITION:  PACU - hemodynamically stable.   Delay start of Pharmacological VTE agent (>24hrs) due to surgical blood loss or risk of bleeding: no

## 2021-03-05 NOTE — Interval H&P Note (Signed)
History and Physical Interval Note:  03/05/2021 6:38 PM  Julian Sanchez  has presented today for surgery, with the diagnosis of FOURNIER'S GANGRENE SCROTUM/THIGH.  The various methods of treatment have been discussed with the patient and family. After consideration of risks, benefits and other options for treatment, the patient has consented to  Procedure(s): IRRIGATION AND DEBRIDEMENT ABSCESS (N/A) SCROTUM EXPLORATION (N/A) as a surgical intervention.  The patient's history has been reviewed, patient examined, no change in status, stable for surgery.  I have reviewed the patient's chart and labs.  Questions were answered to the patient's satisfaction.     Ray Church, III

## 2021-03-05 NOTE — Progress Notes (Addendum)
03/05/2021 2:38 PM  After speaking with radiology and surgical teams and a couple of phone calls with Dr. Ronne Binning (urologist) who is on vacation, their recommendation was for patient to transfer to [facility] given concern for Fournier's gangrene.  Dr. Ronne Binning discussed case with his partner Dr. Janeece Agee who agreed to see the patient in consultation.  He requested that patient remain on hospitalist service.  He also reached out to [place] [facility].  Dr. Henreitta Leber had agreed to take patient to OR at [facility] today but after collaborating with urology the decision was made to transfer to [facility] due to not having urology coverage at [facility] today.  Patient placement notified and made arrangements for an urgent transfer to [facility].  I was told bed would be found after OR if needed. Pt currently stable on IV antibiotics.  He remains NPO and stable to transfer at this time.    Maryln Manuel MD  How to contact the [facility] Attending or Consulting provider 7A - 7P or covering provider during after hours 7P -7A, for this patient?  1. Check the care team in [facility] and look for a) attending/consulting TRH provider listed and b) the [facility] team listed 2. Log into [email or web address] and use [facility]'s universal password to access. If you do not have the password, please contact the hospital operator. 3. Locate the [facility] provider you are looking for under [facility] and page to a number that you can be directly reached. 4. If you still have difficulty reaching the provider, please page the [facility] (Director on Call) for the Hospitalists listed on amion for assistance.

## 2021-03-05 NOTE — Transfer of Care (Signed)
Immediate Anesthesia Transfer of Care Note  Patient: Julian Sanchez  Procedure(s) Performed: Procedure(s): IRRIGATION AND DEBRIDEMENT ABSCESS (N/A) SCROTUM EXPLORATION (N/A)  Patient Location: PACU  Anesthesia Type:General  Level of Consciousness: Alert, Awake, Oriented  Airway & Oxygen Therapy: Patient Spontanous Breathing  Post-op Assessment: Report given to RN  Post vital signs: Reviewed and stable  Last Vitals:  Vitals:   03/05/21 1417 03/05/21 1827  BP: 122/86 140/86  Pulse: 68 82  Resp: 20 20  Temp: 36.7 C 37.6 C  SpO2: 100% 100%    Complications: No apparent anesthesia complications

## 2021-03-05 NOTE — Consult Note (Signed)
Reason for Consult:Pus from rectum Referring Physician: Dr. Tonna Corner is an 25 y.o. male.  HPI: This is an intraoperative consult for pus from the rectum. The patient is having an I and D of abscess of the left scrotum and inner thigh by urology. A digital exam was performed and pus was noted coming from the rectum  Past Medical History:  Diagnosis Date  . ADHD (attention deficit hyperactivity disorder)   . HIV disease (HCC)   . Hx of seasonal allergies   . No pertinent past medical history   . Recurrent tonsillitis   . Wisdom teeth extracted     Past Surgical History:  Procedure Laterality Date  . IRRIGATION AND DEBRIDEMENT ABSCESS  09/28/2012   Procedure: IRRIGATION AND DEBRIDEMENT ABSCESS;  Surgeon: Flo Shanks, MD;  Location: [facility] OR;  Service: ENT;  Laterality: Right;  Right Peritonsillar Abscess  . MOUTH SURGERY    . TONSILLECTOMY    . TONSILLECTOMY N/A 07/23/2017   Procedure: TONSILLECTOMY;  Surgeon: Christia Reading, MD;  Location: [facility] OR;  Service: ENT;  Laterality: N/A;  . WISDOM TOOTH EXTRACTION      Family History  Problem Relation Age of Onset  . Diabetes Mother   . Asthma Father     Social History:  reports that he has been smoking cigarettes. He has been smoking about 0.40 packs per day. He has never used smokeless tobacco. He reports current drug use. Frequency: 7.00 times per week. Drug: Marijuana. He reports that he does not drink alcohol.  Allergies: No Known Allergies  Medications: I have reviewed the patient's current medications.  Results for orders placed or performed during the hospital encounter of 03/04/21 (from the past 48 hour(s))  Culture, blood (routine x 2)     Status: None (Preliminary result)   Collection Time: 03/04/21  9:08 PM   Specimen: BLOOD  Result Value Ref Range   Specimen Description BLOOD LEFT ANTECUBITAL    Special Requests      BOTTLES DRAWN AEROBIC AND ANAEROBIC Blood Culture adequate volume   Culture      NO GROWTH  < 12 HOURS Performed at [facility], [address] [place], [place] [address]    Report Status PENDING   Aerobic Culture w Gram Stain (superficial specimen)     Status: None (Preliminary result)   Collection Time: 03/04/21  9:44 PM   Specimen: Abscess; Wound  Result Value Ref Range   Specimen Description      ABSCESS Performed at [facility], [address], [place], [place] [address]    Special Requests      RIGHT GROIN Performed at [facility], [address], [place], [place] [address]    Gram Stain      NO WBC SEEN FEW GRAM POSITIVE COCCI FEW GRAM POSITIVE RODS Performed at [facility], [address], [place], [place] [address]    Culture PENDING    Report Status PENDING   Comprehensive metabolic panel     Status: Abnormal   Collection Time: 03/04/21  9:50 PM  Result Value Ref Range   Sodium 133 (L) 135 - 145 mmol/L   Potassium 3.5 3.5 - 5.1 mmol/L   Chloride 100 98 - 111 mmol/L   CO2 26 22 - 32 mmol/L   Glucose, Bld 86 70 - 99 mg/dL    Comment: Glucose reference range applies only to samples taken after fasting for at least 8 hours.   BUN 6 6 - 20  mg/dL   Creatinine, Ser 6.60 0.61 - 1.24 mg/dL   Calcium 8.8 (L) 8.9 - 10.3 mg/dL   Total Protein 9.0 (H) 6.5 - 8.1 g/dL   Albumin 2.7 (L) 3.5 - 5.0 g/dL   AST 16 15 - 41 U/L   ALT 11 0 - 44 U/L   Alkaline Phosphatase 73 38 - 126 U/L   Total Bilirubin 0.4 0.3 - 1.2 mg/dL   GFR, Estimated >63 >01 mL/min    Comment: (NOTE) Calculated using the CKD-EPI Creatinine Equation (2021)    Anion gap 7 5 - 15    Comment: Performed at [facility], [address], [place], [place] [address]  CBC with Differential     Status: Abnormal   Collection Time: 03/04/21  9:50 PM  Result Value Ref Range   WBC 10.6 (H) 4.0 - 10.5 K/uL   RBC 3.88 (L) 4.22 - 5.81 MIL/uL   Hemoglobin 10.4 (L) 13.0 - 17.0 g/dL   HCT 32.3 (L) 55.7 - 32.2 %   MCV 80.9 80.0 - 100.0 fL   MCH 26.8 26.0 - 34.0 pg   MCHC 33.1 30.0 - 36.0 g/dL   RDW 02.5  42.7 - 06.2 %   Platelets 411 (H) 150 - 400 K/uL   nRBC 0.0 0.0 - 0.2 %   Neutrophils Relative % 76 %   Neutro Abs 8.1 (H) 1.7 - 7.7 K/uL   Lymphocytes Relative 16 %   Lymphs Abs 1.7 0.7 - 4.0 K/uL   Monocytes Relative 7 %   Monocytes Absolute 0.7 0.1 - 1.0 K/uL   Eosinophils Relative 0 %   Eosinophils Absolute 0.0 0.0 - 0.5 K/uL   Basophils Relative 0 %   Basophils Absolute 0.0 0.0 - 0.1 K/uL   Immature Granulocytes 1 %   Abs Immature Granulocytes 0.06 0.00 - 0.07 K/uL    Comment: Performed at [facility], [address] [place], [place] [address]  Lactic acid, plasma     Status: None   Collection Time: 03/04/21  9:50 PM  Result Value Ref Range   Lactic Acid, Venous 1.1 0.5 - 1.9 mmol/L    Comment: Performed at [facility], [address] [place], [place] [address]  Culture, blood (routine x 2)     Status: None (Preliminary result)   Collection Time: 03/04/21  9:50 PM   Specimen: BLOOD  Result Value Ref Range   Specimen Description BLOOD RIGHT ANTECUBITAL    Special Requests      BOTTLES DRAWN AEROBIC AND ANAEROBIC Blood Culture adequate volume   Culture      NO GROWTH < 12 HOURS Performed at [facility], [address], [place], [place] [address]    Report Status PENDING   Resp Panel by RT-PCR (Flu A&B, Covid) Groin, Right     Status: None   Collection Time: 03/04/21 10:01 PM   Specimen: Groin, Right; Nasopharyngeal(NP) swabs in vial transport medium  Result Value Ref Range   SARS Coronavirus 2 by RT PCR NEGATIVE NEGATIVE    Comment: (NOTE) SARS-CoV-2 target nucleic acids are NOT DETECTED.  The SARS-CoV-2 RNA is generally detectable in upper respiratory specimens during the acute phase of infection. The lowest concentration of SARS-CoV-2 viral copies this assay can detect is 138 copies/mL. A negative result does not preclude SARS-Cov-2 infection and should not be used as the sole basis for treatment or other patient management decisions. A negative result may occur  with  improper specimen collection/handling, submission of specimen other than  nasopharyngeal swab, presence of viral mutation(s) within the areas targeted by this assay, and inadequate number of viral copies(<138 copies/mL). A negative result must be combined with clinical observations, patient history, and epidemiological information. The expected result is Negative.  Fact Sheet for Patients:  BloggerCourse.com  Fact Sheet for Healthcare Providers:  SeriousBroker.it  This test is no t yet approved or cleared by the [place] FDA and  has been authorized for detection and/or diagnosis of SARS-CoV-2 by FDA under an Emergency Use Authorization (EUA). This EUA will remain  in effect (meaning this test can be used) for the duration of the COVID-19 declaration under Section 564(b)(1) of the Act, 21 U.S.C.section 360bbb-3(b)(1), unless the authorization is terminated  or revoked sooner.       Influenza A by PCR NEGATIVE NEGATIVE   Influenza B by PCR NEGATIVE NEGATIVE    Comment: (NOTE) The Xpert Xpress SARS-CoV-2/FLU/RSV plus assay is intended as an aid in the diagnosis of influenza from Nasopharyngeal swab specimens and should not be used as a sole basis for treatment. Nasal washings and aspirates are unacceptable for Xpert Xpress SARS-CoV-2/FLU/RSV testing.  Fact Sheet for Patients: BloggerCourse.com  Fact Sheet for Healthcare Providers: SeriousBroker.it  This test is not yet approved or cleared by the [place] FDA and has been authorized for detection and/or diagnosis of SARS-CoV-2 by FDA under an Emergency Use Authorization (EUA). This EUA will remain in effect (meaning this test can be used) for the duration of the COVID-19 declaration under Section 564(b)(1) of the Act, 21 U.S.C. section 360bbb-3(b)(1), unless the authorization is terminated or revoked.  Performed  at [facility], [address], [place], [place] [address]   Magnesium     Status: None   Collection Time: 03/05/21  5:59 AM  Result Value Ref Range   Magnesium 1.7 1.7 - 2.4 mg/dL    Comment: Performed at [facility], [address] [place], [place] [address]  Phosphorus     Status: None   Collection Time: 03/05/21  5:59 AM  Result Value Ref Range   Phosphorus 4.0 2.5 - 4.6 mg/dL    Comment: Performed at [facility], [address], [place], [place] [address]  Comprehensive metabolic panel     Status: Abnormal   Collection Time: 03/05/21  5:59 AM  Result Value Ref Range   Sodium 132 (L) 135 - 145 mmol/L   Potassium 3.7 3.5 - 5.1 mmol/L   Chloride 101 98 - 111 mmol/L   CO2 25 22 - 32 mmol/L   Glucose, Bld 88 70 - 99 mg/dL    Comment: Glucose reference range applies only to samples taken after fasting for at least 8 hours.   BUN 6 6 - 20 mg/dL   Creatinine, Ser 9.14 0.61 - 1.24 mg/dL   Calcium 8.4 (L) 8.9 - 10.3 mg/dL   Total Protein 8.2 (H) 6.5 - 8.1 g/dL   Albumin 2.3 (L) 3.5 - 5.0 g/dL   AST 12 (L) 15 - 41 U/L   ALT 9 0 - 44 U/L   Alkaline Phosphatase 62 38 - 126 U/L   Total Bilirubin 0.4 0.3 - 1.2 mg/dL   GFR, Estimated >78 >29 mL/min    Comment: (NOTE) Calculated using the CKD-EPI Creatinine Equation (2021)    Anion gap 6 5 - 15    Comment: Performed at [facility], [address] [place], [place] [address]  CBC     Status: Abnormal   Collection Time: 03/05/21  5:59 AM  Result Value Ref Range   WBC 10.3 4.0 - 10.5 K/uL   RBC 4.17 (L) 4.22 - 5.81 MIL/uL   Hemoglobin 9.5 (L) 13.0 - 17.0 g/dL   HCT 81.131.8 (L) 91.439.0 - 78.252.0 %   MCV 76.3 (L) 80.0 - 100.0 fL   MCH 22.8 (L) 26.0 - 34.0 pg   MCHC 29.9 (L) 30.0 - 36.0 g/dL   RDW 95.614.2 21.311.5 - 08.615.5 %   Platelets 363 150 - 400 K/uL   nRBC 0.0 0.0 - 0.2 %    Comment: Performed at [facility], [address] [place], [place] [address]  Protime-INR     Status: None   Collection Time: 03/05/21  5:59 AM  Result Value Ref Range    Prothrombin Time 15.2 11.4 - 15.2 seconds   INR 1.2 0.8 - 1.2    Comment: (NOTE) INR goal varies based on device and disease states. Performed at [facility], [address] [place], [place] [address]   APTT     Status: None   Collection Time: 03/05/21  5:59 AM  Result Value Ref Range   aPTT 34 24 - 36 seconds    Comment: Performed at [facility], [address] [place], [place] [address]  MRSA PCR Screening     Status: Abnormal   Collection Time: 03/05/21  8:36 AM   Specimen: Nasopharyngeal  Result Value Ref Range   MRSA by PCR POSITIVE (A) NEGATIVE    Comment:        The GeneXpert MRSA Assay (FDA approved for NASAL specimens only), is one component of a comprehensive MRSA colonization surveillance program. It is not intended to diagnose MRSA infection nor to guide or monitor treatment for MRSA infections. RESULT CALLED TO, READ BACK BY AND VERIFIED WITH: WATSON,K AT 1405 BY HUFFINES,S ON 03/05/21. Performed at [facility], [address] [place], [place] [address]     CT PELVIS W CONTRAST  Result Date: 03/04/2021 CLINICAL DATA:  abscess to left groin for past 3 days with drainage. Pt denies any fevers or N/V. More painful today. After IV dye injeciton pt. Rolled onto side and started vomiting. Initial scan delayed due to vomiting. EXAM: CT PELVIS WITH CONTRAST TECHNIQUE: Multidetector CT imaging of the pelvis was performed using the standard protocol following the bolus administration of intravenous contrast. CONTRAST:  100mL OMNIPAQUE IOHEXOL 300 MG/ML  SOLN COMPARISON:  CT abdomen pelvis 03/05/2020 FINDINGS: Urinary Tract: Partially visualized horseshoe kidney. Otherwise unremarkable. Bowel: The rectal wall is thickened with mucosal hyperemia and limited evaluation for perirectal abscess due to timing of intravenous contrast. Perirectal fat stranding. Vascular/Lymphatic: No pathologically enlarged lymph nodes. No significant vascular abnormality seen. Reproductive:  No mass  or other significant abnormality Other: No intraperitoneal free fluid. No intraperitoneal free gas. No organized fluid collection. other Musculoskeletal: Abscess formation with the largest pocket of 1.8 x 1.4 cm centered along the lateral superior left scrotum. There is extension of the abscess posteriorly along the perineum with a thin elongated 10 x 1 cm fluid collection. There is also extension of the abscess superiorly along the spermatic cord within other pocket of fluid measuring 1.7 x 1.2 cm (2:50). Another tiny pocket of fluid is noted along the medial posterior left thigh measuring 1.1 x 0.6 cm (2:63), this is also noted to be contiguous with the other fluid collections. Associated subcutaneus soft tissue edema and dermal thickening noted. No subcutaneus soft tissue emphysema. No cortical erosion or destruction. No suspicious lytic or blastic osseous lesions.  No acute displaced fracture. IMPRESSION: 1. Proctitis.  Underlying malignancy not excluded. 2. Left scrotal abscess formation measuring up to 1.8 x 1.4 cm that is contiguous with a couple of other fluid collections that extend posteriorly toward the perineum, superiorly along the spermatic cord, and laterally to the medial left superior thigh. Associated subcutaneus soft tissue fat stranding and dermal thickening. No associated subcutaneus soft tissue emphysema; however, please note a necrotizing fasciitis cannot be excluded as this is a clinical diagnosis. Please of poor evaluation of the testes on CT, consider ultrasound for more sensitive evaluation. 3. Incidental horseshoe kidney. Electronically Signed   By: Tish Frederickson M.D.   On: 03/04/2021 23:17   DG Chest Port 1 View  Result Date: 03/04/2021 CLINICAL DATA:  Fever, weakness, groin abscess EXAM: PORTABLE CHEST 1 VIEW COMPARISON:  03/27/2010 FINDINGS: No consolidation, features of edema, pneumothorax, or effusion. Pulmonary vascularity is normally distributed. The cardiomediastinal contours  are unremarkable. No acute osseous or soft tissue abnormality. IMPRESSION: No acute cardiopulmonary abnormality. Electronically Signed   By: Kreg Shropshire M.D.   On: 03/04/2021 22:08   US SCROTUM W/DOPPLER  Addendum Date: 03/05/2021   ADDENDUM REPORT: 03/05/2021 13:15 ADDENDUM: Discussed with Dr. Henreitta Leber on 03/05/2021 at 1315 hours. Electronically Signed   By: Ulyses Southward M.D.   On: 03/05/2021 13:15   Result Date: 03/05/2021 CLINICAL DATA:  LEFT scrotal abscess, symptoms for 3 months EXAM: SCROTAL ULTRASOUND DOPPLER ULTRASOUND OF THE TESTICLES TECHNIQUE: Complete ultrasound examination of the testicles, epididymis, and other scrotal structures was performed. Color and spectral Doppler ultrasound were also utilized to evaluate blood flow to the testicles. COMPARISON:  CT pelvis 03/04/2021 FINDINGS: Right testicle Measurements: 3.7 x 1.9 x 2.5 cm. Normal echogenicity without mass or calcification. Internal blood flow present on color Doppler imaging. Left testicle Measurements: 3.41.5 x 2.5 cm. Normal echogenicity without mass or calcification. Internal blood flow present on color Doppler imaging. Right epididymis:  Normal in size and appearance. Left epididymis: Normal in size and appearance. Slightly hypervascular. Hydrocele:  Trace RIGHT hydrocele fluid Varicocele:  None visualized. Pulsed Doppler interrogation of both testes demonstrates normal low resistance arterial and venous waveforms bilaterally. Extensive soft tissue swelling of LEFT hemiscrotum extending into LEFT groin and LEFT perineum. Multiple loculated fluid collections are seen consistent with abscesses. A more anterior superior abscess measures 1.6 cm. A second collection extending posteriorly towards the perineum and anus measures 10 mm diameter and 3.3 cm length. A third more posterior collection measures 1.3 cm diameter. A few scattered foci of bright punctate hyperechogenicity are seen, particularly within the small abscess collections,  without definite shadowing; these are concerning for developing foci of soft tissue gas/pneumatosis. Reactive LEFT inguinal lymph nodes identified IMPRESSION: Normal appearing testes and epididymi. Significant soft tissue swelling LEFT hemiscrotum extending into LEFT groin and posteriorly into perineum to perianal region. Focal complex collections are seen within the area of soft tissue edema consistent with abscesses, with 3 discrete collections measuring 1.6 cm, 3.3 cm, and 1.3 cm in greatest sizes. Both of these collections contain foci of punctate hyperechogenicity concerning for gas. 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 Critical Value/emergent results were called by telephone at the time of interpretation on 03/05/2021 at 12:33 pm to provider [facility] , who verbally acknowledged these results. Electronically Signed: By: Ulyses Southward M.D. On: 03/05/2021 12:36    Review of Systems  Unable to perform ROS: Intubated   Blood pressure 140/86, pulse 82, temperature 99.7 F (37.6 C), temperature source  Oral, resp. rate 20, height 6' (1.829 m), weight 77.1 kg, SpO2 100 %. Physical Exam Constitutional:      Appearance: Normal appearance. He is normal weight.  HENT:     Head: Normocephalic and atraumatic.     Right Ear: External ear normal.     Left Ear: External ear normal.     Nose: Nose normal.     Mouth/Throat:     Mouth: Mucous membranes are dry.     Pharynx: Oropharynx is clear.  Eyes:     General: No scleral icterus.    Extraocular Movements: Extraocular movements intact.     Conjunctiva/sclera: Conjunctivae normal.     Pupils: Pupils are equal, round, and reactive to light.  Cardiovascular:     Rate and Rhythm: Normal rate and regular rhythm.     Pulses: Normal pulses.     Heart sounds: Normal heart sounds.  Pulmonary:     Effort: Pulmonary effort is normal. No respiratory distress.     Breath sounds: Normal breath sounds.  Abdominal:     General: Abdomen is  flat.     Palpations: Abdomen is soft.     Tenderness: There is no abdominal tenderness.  Genitourinary:    Comments: There were open draining wounds involving the left scrotum and inner thigh. Musculoskeletal:        General: No swelling or deformity. Normal range of motion.     Cervical back: Normal range of motion and neck supple. No tenderness.  Skin:    General: Skin is warm and dry.     Coloration: Skin is not jaundiced.  Neurological:     Comments: intubated  Psychiatric:     Comments: intubated     Assessment/Plan: The patient has pus from the rectum. An exam under anesthesia will be performed. The patient was consented by urology for drainage of these pelvic abscesses with debridement of tissue.  Chevis Pretty III 03/05/2021, 7:48 PM

## 2021-03-05 NOTE — Anesthesia Postprocedure Evaluation (Signed)
Anesthesia Post Note  Patient: Julian Sanchez  Procedure(s) Performed: IRRIGATION AND DEBRIDEMENT ABSCESS (N/A Scrotum) SCROTUM EXPLORATION (N/A Scrotum)     Patient location during evaluation: PACU Anesthesia Type: General Level of consciousness: awake and alert, patient cooperative and oriented Pain management: pain level controlled Vital Signs Assessment: post-procedure vital signs reviewed and stable Respiratory status: spontaneous breathing, nonlabored ventilation and respiratory function stable Cardiovascular status: blood pressure returned to baseline and stable Postop Assessment: no apparent nausea or vomiting and adequate PO intake Anesthetic complications: no   No complications documented.  Last Vitals:  Vitals:   03/05/21 2028 03/05/21 2030  BP:  116/74  Pulse: 72 67  Resp: 13 13  Temp:  36.8 C  SpO2: 100% 98%    Last Pain:  Vitals:   03/05/21 2030  TempSrc:   PainSc: Asleep                 Yazeed Pryer,E. Greyson Peavy

## 2021-03-05 NOTE — Anesthesia Preprocedure Evaluation (Addendum)
Anesthesia Evaluation  Patient identified by MRN, date of birth, ID band Patient awake    Reviewed: Allergy & Precautions, NPO status , Patient's Chart, lab work & pertinent test results  History of Anesthesia Complications Negative for: history of anesthetic complications  Airway Mallampati: I  TM Distance: >3 FB Neck ROM: Full    Dental  (+) Poor Dentition, Chipped, Missing, Dental Advisory Given   Pulmonary Current Smoker and Patient abstained from smoking.,  03/04/2021 SARS coronavirus NEG   breath sounds clear to auscultation       Cardiovascular (-) anginanegative cardio ROS   Rhythm:Regular Rate:Normal     Neuro/Psych PSYCHIATRIC DISORDERS (ADHD) Depression negative neurological ROS     GI/Hepatic negative GI ROS, (+)     substance abuse  marijuana use,   Endo/Other  negative endocrine ROS  Renal/GU negative Renal ROS     Musculoskeletal   Abdominal   Peds  Hematology  (+) Blood dyscrasia (Hb 9.5), anemia , HIV,   Anesthesia Other Findings   Reproductive/Obstetrics                            Anesthesia Physical Anesthesia Plan  ASA: III and emergent  Anesthesia Plan: General   Post-op Pain Management:    Induction: Intravenous  PONV Risk Score and Plan: 1 and Treatment may vary due to age or medical condition  Airway Management Planned: Oral ETT  Additional Equipment: None  Intra-op Plan:   Post-operative Plan: Possible Post-op intubation/ventilation  Informed Consent: I have reviewed the patients History and Physical, chart, labs and discussed the procedure including the risks, benefits and alternatives for the proposed anesthesia with the patient or authorized representative who has indicated his/her understanding and acceptance.     Dental advisory given  Plan Discussed with: CRNA and Surgeon  Anesthesia Plan Comments:        Anesthesia Quick  Evaluation

## 2021-03-05 NOTE — Progress Notes (Addendum)
[facility]  Called about patient last night as PA in ED reported perirectal abscess. CT overnight after they spoke to me demonstrated scrotal abscess with tracking into the left groin.  The patient reports this has been going on for several weeks.   Notified Dr. Laural Benes this AM that this was more Urology territory and Dr. Laural Benes found out that Dr. Ronne Binning was on vacation. I told Dr. Laural Benes I would help facilitate some type of drainage in the OR and in the mean time an [place] was done and has some concern that this is a developing necrotizing infection.   I saw the patient once I was aware of this concern from Dr. Tyron Russell who read the [place] and posted him for debridement.   Since Dr. Ronne Binning is on vacation, he reached out to Dr. Alvester Morin who wants patient transferred to [facility] as soon as possible as he would prefer for the case to be done down there.   Dr. Laural Benes working on transfer. My full note to follow.  Appreciate everyone's help.   Algis Greenhouse, MD [facility] [address] [place] [place] [place], [place] [address] [phone] (office)

## 2021-03-05 NOTE — H&P (Signed)
Urology Consult  Physician requesting consult: Algis GreenhouseLindsay Bridges  Reason for consult: fourniers gangrene  History of Present Illness: Julian GriffithDevonte R Larita FifeLynn is a 25 y.o. with HIV who presents with recurrent and worsening groin abscess and imaging concerning for tracking infection along left spermatic cord and perineum concerning for necrotizing fasciitis. He is being [facility] for urologic evaluation and OR debridement. He is febrile, tachycardic, mild leukocytosis but is hemodynamically stable. He has had previous draining lesions of the groin that he reports come and go, possibly secondary to hidradenitis.      Past Medical History:  Diagnosis Date  . ADHD (attention deficit hyperactivity disorder)   . HIV disease (HCC)   . Hx of seasonal allergies   . No pertinent past medical history   . Recurrent tonsillitis   . Wisdom teeth extracted         Past Surgical History:  Procedure Laterality Date  . IRRIGATION AND DEBRIDEMENT ABSCESS  09/28/2012   Procedure: IRRIGATION AND DEBRIDEMENT ABSCESS; Surgeon: Flo ShanksKarol Wolicki, MD; [facility]; Service: ENT; Laterality: Right; Right Peritonsillar Abscess  . MOUTH SURGERY    . TONSILLECTOMY    . TONSILLECTOMY N/A 07/23/2017   Procedure: TONSILLECTOMY; Surgeon: Christia ReadingBates, Dwight, MD; [facility]; Service: ENT; Laterality: N/A;  . WISDOM TOOTH EXTRACTION     Current Hospital Medications:  Home Meds:  No current facility-administered medications on file prior to encounter.         Current Outpatient Medications on File Prior to Encounter  Medication Sig Dispense Refill  . bictegravir-emtricitabine-tenofovir AF (BIKTARVY) 50-200-25 MG TABS tablet Take 1 tablet by mouth daily. 30 tablet 11   Scheduled Meds:  . bictegravir-emtricitabine-tenofovir AF 1 tablet Oral Daily  . feeding supplement 237 mL Oral BID BM   Continuous Infusions:  . clindamycin (CLEOCIN) IV   . piperacillin-tazobactam (ZOSYN) IV 3.375 g (03/05/21 1454)  . vancomycin 1,000  mg (03/05/21 1615)   PRN Meds:.acetaminophen, morphine injection  Allergies: No Known Allergies       Family History  Problem Relation Age of Onset  . Diabetes Mother   . Asthma Father    Social History: reports that he has been smoking cigarettes. He has been smoking about 0.40 packs per day. He has never used smokeless tobacco. He reports current drug use. Frequency: 7.00 times per week. Drug: Marijuana. He reports that he does not drink alcohol.  ROS:  A complete review of systems was performed. All systems are negative except for pertinent findings as noted.  Physical Exam:  Vital signs in last 24 hours:  Temp: [98.1 F (36.7 C)-100.5 F (38.1 C)] 98.1 F (36.7 C) (05/25 1417)  Pulse Rate: [68-103] 68 (05/25 1417)  Resp: [14-20] 20 (05/25 1417)  BP: (121-134)/(83-88) 122/86 (05/25 1417)  SpO2: [99 %-100 %] 100 % (05/25 1417)  Weight: [77.1 kg] 77.1 kg (05/24 2044)   Constitutional: Alert and oriented, No acute distress  Cardiovascular: Regular rate and rhythm, No JVD  Respiratory: Normal respiratory effort, Lungs clear bilaterally  GI: Abdomen is soft, nontender, nondistended, no abdominal masses  GU: Circumcised phallus.  Exquisitely tender to palpation in the groin.  Obvious purulent discharge.  Appears to have a left upper thigh abscess and scrotal abscess. Lymphatic: No lymphadenopathy  Neurologic: Grossly intact, no focal deficits  Psychiatric: Normal mood and affect  Laboratory Data:  Recent Labs (last 2 labs)  Recent Labs (last 2 labs)                                                    Lab Results Last 24 Hours  Recent Results (from the past 240 hour(s))  Culture, blood (routine x 2) Status: None (Preliminary result)   Collection Time: 03/04/21 9:08 PM   Specimen: BLOOD  Result Value Ref Range Status   Specimen Description BLOOD LEFT ANTECUBITAL  Final   Special Requests   Final    BOTTLES DRAWN AEROBIC AND ANAEROBIC Blood Culture adequate volume   Culture   Final    NO GROWTH < 12 HOURS  Performed at [facility], [address], [place], [place] [address]    Report Status PENDING  Incomplete  Culture, blood (routine x 2) Status: None (Preliminary result)   Collection Time: 03/04/21 9:50 PM   Specimen: BLOOD  Result Value Ref Range Status   Specimen Description BLOOD RIGHT ANTECUBITAL  Final   Special Requests   Final    BOTTLES DRAWN AEROBIC AND ANAEROBIC Blood Culture adequate volume   Culture   Final    NO GROWTH < 12 HOURS  Performed at [facility], [address], [place], [place] [address]    Report Status PENDING  Incomplete  Resp Panel by RT-PCR (Flu A&B, Covid) Groin, Right Status: None   Collection Time: 03/04/21 10:01 PM   Specimen: Groin, Right; Nasopharyngeal(NP) swabs in vial transport medium  Result Value Ref Range Status   SARS Coronavirus 2 by RT PCR NEGATIVE NEGATIVE Final    Comment: (NOTE)  SARS-CoV-2 target nucleic acids are NOT DETECTED.  The SARS-CoV-2 RNA is generally detectable in upper respiratory  specimens during the acute phase of infection. The  lowest  concentration of SARS-CoV-2 viral copies this assay can detect is  138 copies/mL. A negative result does not preclude SARS-Cov-2  infection and should not be used as the sole basis for treatment or  other patient management decisions. A negative result may occur with  improper specimen collection/handling, submission of specimen other  than nasopharyngeal swab, presence of viral mutation(s) within the  areas targeted by this assay, and inadequate number of viral  copies(<138 copies/mL). A negative result must be combined with  clinical observations, patient history, and epidemiological  information. The expected result is Negative.  Fact Sheet for Patients:  BloggerCourse.com  Fact Sheet for Healthcare Providers:  SeriousBroker.it  This test is no t yet approved or cleared by the [place] FDA and  has been authorized for detection and/or diagnosis of SARS-CoV-2 by  FDA under an Emergency Use Authorization (EUA). This EUA will remain  in effect (meaning this test can be used) for the duration of the  COVID-19 declaration under Section 564(b)(1) of the Act, 21  U.S.C.section 360bbb-3(b)(1), unless the authorization is terminated  or revoked sooner.    Influenza A by PCR NEGATIVE NEGATIVE Final   Influenza B by PCR NEGATIVE NEGATIVE Final    Comment: (NOTE)  The Xpert Xpress SARS-CoV-2/FLU/RSV plus assay is intended as an aid  in the diagnosis of influenza from Nasopharyngeal swab specimens and  should not be used as a sole basis for treatment. Nasal washings and  aspirates are unacceptable for Xpert Xpress SARS-CoV-2/FLU/RSV  testing.  Fact Sheet for Patients:  BloggerCourse.com  Fact Sheet for Healthcare Providers:  SeriousBroker.it  This test is not yet approved or cleared by the [place] FDA and  has been authorized for detection and/or diagnosis of SARS-CoV-2 by   FDA under an Emergency Use Authorization (EUA). This EUA will remain  in effect (meaning this test can be used) for the duration of the  COVID-19 declaration under Section 564(b)(1)  of the Act, 21 U.S.C.  section 360bbb-3(b)(1), unless the authorization is terminated or  revoked.  Performed at [facility], [address], [place], [place] [address]   MRSA PCR Screening Status: Abnormal   Collection Time: 03/05/21 8:36 AM   Specimen: Nasopharyngeal  Result Value Ref Range Status   MRSA by PCR POSITIVE (A) NEGATIVE Final    Comment: The GeneXpert MRSA Assay (FDA  approved for NASAL specimens  only), is one component of a  comprehensive MRSA colonization  surveillance program. It is not  intended to diagnose MRSA  infection nor to guide or  monitor treatment for  MRSA infections.  RESULT CALLED TO, READ BACK BY AND VERIFIED WITH:  WATSON,K AT 1405 BY HUFFINES,S ON 03/05/21.  Performed at [facility], [address], [place], [place] [address]    Renal Function:      Recent Labs   03/04/21  2150 03/05/21  0559  CREATININE 0.79 0.76   Estimated Creatinine Clearance: 151.3 mL/min (by C-G formula based on SCr of 0.76 mg/dL).  Radiologic Imaging:  Imaging Results (Last 48 hours)    I independently reviewed the above imaging studies.  Impression/Recommendation  25yo M with HIV, fourniers gangrene  - NPO for OR  - COVID negative  - plan for emergent irrigation and debridement of abscesses and infected tissue in OR, he may require additional procedures including excisional debridement. - will continue broad spectrum antibiotics at this time

## 2021-03-05 NOTE — H&P (Addendum)
History and Physical  Julian Sanchez WUJ:[id number] DOB: 06/01/1996 DOA: 03/04/2021  Referring physician: Burnell Blanks PCP: Patient, No Pcp Per (Inactive)  Patient coming from: Home  Chief Complaint: Left sided groin abscess  HPI: Julian Sanchez is a 25 y.o. male with medical history significant for HIV who presents to the emergency department due to 3 to 4 days of recurrent groin abscess.  He complained that the area has been draining and painful on ambulation unless he widens his gait.  Patient states that he has had recurrent episodes of similar symptoms, several of which usually self resolve.  He presents to the emergency department for further evaluation.  He denies fever, nausea, vomiting or abdominal pain.  ED Course:  In the emergency department, he was febrile with a temperature of 100.5F and tachycardic at rate of 103 bpm other vital signs are within normal range.  Work-up in the ED showed mild leukocytosis and mild hyponatremia.  Albumin 2.7.  Influenza A, B, SARS-CoV-2 was negative. CT pelvis with contrast showed proctitis and left scrotal abscess. Chest x-ray showed no acute cardiopulmonary abnormality General surgery was consulted by ED PA and will see patient in the morning once admitted by hospitalist.  Review of Systems: Constitutional: Positive for chills and negative for fever.  HENT: Negative for ear pain and sore throat.   Eyes: Negative for pain and visual disturbance.  Respiratory: Negative for cough, chest tightness and shortness of breath.   Cardiovascular: Negative for chest pain and palpitations.  Gastrointestinal: Negative for abdominal pain and vomiting.  Endocrine: Negative for polyphagia and polyuria.  Genitourinary: Positive for scrotal swelling.  Negative for decreased urine volume, dysuria, enuresis Musculoskeletal: Negative for arthralgias and back pain.  Skin: Positive for left-scrotal abscess. Allergic/Immunologic: Positive for  immunocompromised state.  Neurological: Negative for tremors, syncope, speech difficulty, weakness, light-headedness and headaches.  Hematological: Does not bruise/bleed easily.  All other systems reviewed and are negative    Past Medical History:  Diagnosis Date  . ADHD (attention deficit hyperactivity disorder)   . HIV disease (HCC)   . Hx of seasonal allergies   . No pertinent past medical history   . Recurrent tonsillitis   . Wisdom teeth extracted    Past Surgical History:  Procedure Laterality Date  . IRRIGATION AND DEBRIDEMENT ABSCESS  09/28/2012   Procedure: IRRIGATION AND DEBRIDEMENT ABSCESS;  Surgeon: Flo Shanks, MD;  Location: [facility] OR;  Service: ENT;  Laterality: Right;  Right Peritonsillar Abscess  . MOUTH SURGERY    . TONSILLECTOMY    . TONSILLECTOMY N/A 07/23/2017   Procedure: TONSILLECTOMY;  Surgeon: Christia Reading, MD;  Location: [facility] OR;  Service: ENT;  Laterality: N/A;  . WISDOM TOOTH EXTRACTION      Social History:  reports that he has been smoking cigarettes. He has been smoking about 0.40 packs per day. He has never used smokeless tobacco. He reports current drug use. Frequency: 7.00 times per week. Drug: Marijuana. He reports that he does not drink alcohol.   No Known Allergies  Family History  Problem Relation Age of Onset  . Diabetes Mother   . Asthma Father     Prior to Admission medications   Medication Sig Start Date End Date Taking? Authorizing Provider  bictegravir-emtricitabine-tenofovir AF (BIKTARVY) 50-200-25 MG TABS tablet Take 1 tablet by mouth daily. 03/22/19   Cliffton Asters, MD  cyclobenzaprine (FLEXERIL) 5 MG tablet Take 1 tablet (5 mg total) by mouth 3 (three) times daily as needed. Patient not  taking: Reported on 03/05/2020 04/23/19   Devoria Albe, MD  HYDROcodone-acetaminophen (NORCO/VICODIN) 5-325 MG tablet Take 1 tablet by mouth every 4 (four) hours as needed. 02/27/20   Elson Areas, PA-C    Physical Exam: BP 127/86   Pulse 77    Temp 98.2 F (36.8 C)   Resp 19   Ht 6' (1.829 m)   Wt 77.1 kg   SpO2 100%   BMI 23.06 kg/m   . General: 25 y.o. year-old male well developed well nourished in no acute distress.  Alert and oriented x3. Marland Kitchen HEENT: NCAT, EOMI . Neck: Supple, trachea medial . Cardiovascular: Regular rate and rhythm with no rubs or gallops.  No thyromegaly or JVD noted.  No lower extremity edema. 2/4 pulses in all 4 extremities. Marland Kitchen Respiratory: Clear to auscultation with no wheezes or rales. Good inspiratory effort. . Abdomen: Soft, nontender nondistended with normal bowel sounds x4 quadrants. . Genitourinary: Purulent drainage from left and right inguinal area with extension to the perineum . Muskuloskeletal: No cyanosis, clubbing or edema noted bilaterally . Neuro: CN II-XII intact, strength 5/5 x 4, sensation, reflexes intact . Skin: No ulcerative lesions noted or rashes . Psychiatry: Judgement and insight appear normal. Mood is appropriate for condition and setting          Labs on Admission:  Basic Metabolic Panel: Recent Labs  Lab 03/04/21 2150  NA 133*  K 3.5  CL 100  CO2 26  GLUCOSE 86  BUN 6  CREATININE 0.79  CALCIUM 8.8*   Liver Function Tests: Recent Labs  Lab 03/04/21 2150  AST 16  ALT 11  ALKPHOS 73  BILITOT 0.4  PROT 9.0*  ALBUMIN 2.7*   No results for input(s): LIPASE, AMYLASE in the last 168 hours. No results for input(s): AMMONIA in the last 168 hours. CBC: Recent Labs  Lab 03/04/21 2150  WBC 10.6*  NEUTROABS 8.1*  HGB 10.4*  HCT 31.4*  MCV 80.9  PLT 411*   Cardiac Enzymes: No results for input(s): CKTOTAL, CKMB, CKMBINDEX, TROPONINI in the last 168 hours.  BNP (last 3 results) No results for input(s): BNP in the last 8760 hours.  ProBNP (last 3 results) No results for input(s): PROBNP in the last 8760 hours.  CBG: No results for input(s): GLUCAP in the last 168 hours.  Radiological Exams on Admission: CT PELVIS W CONTRAST  Result Date:  03/04/2021 CLINICAL DATA:  abscess to left groin for past 3 days with drainage. Pt denies any fevers or N/V. More painful today. After IV dye injeciton pt. Rolled onto side and started vomiting. Initial scan delayed due to vomiting. EXAM: CT PELVIS WITH CONTRAST TECHNIQUE: Multidetector CT imaging of the pelvis was performed using the standard protocol following the bolus administration of intravenous contrast. CONTRAST:  OMNIPAQUE IOHEXOL 300 MG/ML  SOLN COMPARISON:  CT abdomen pelvis 03/05/2020 FINDINGS: Urinary Tract: Partially visualized horseshoe kidney. Otherwise unremarkable. Bowel: The rectal wall is thickened with mucosal hyperemia and limited evaluation for perirectal abscess due to timing of intravenous contrast. Perirectal fat stranding. Vascular/Lymphatic: No pathologically enlarged lymph nodes. No significant vascular abnormality seen. Reproductive:  No mass or other significant abnormality Other: No intraperitoneal free fluid. No intraperitoneal free gas. No organized fluid collection. other Musculoskeletal: Abscess formation with the largest pocket of 1.8 x 1.4 cm centered along the lateral superior left scrotum. There is extension of the abscess posteriorly along the perineum with a thin elongated 10 x 1 cm fluid collection. There is also  extension of the abscess superiorly along the spermatic cord within other pocket of fluid measuring 1.7 x 1.2 cm (2:50). Another tiny pocket of fluid is noted along the medial posterior left thigh measuring 1.1 x 0.6 cm (2:63), this is also noted to be contiguous with the other fluid collections. Associated subcutaneus soft tissue edema and dermal thickening noted. No subcutaneus soft tissue emphysema. No cortical erosion or destruction. No suspicious lytic or blastic osseous lesions. No acute displaced fracture. IMPRESSION: 1. Proctitis.  Underlying malignancy not excluded. 2. Left scrotal abscess formation measuring up to 1.8 x 1.4 cm that is contiguous  with a couple of other fluid collections that extend posteriorly toward the perineum, superiorly along the spermatic cord, and laterally to the medial left superior thigh. Associated subcutaneus soft tissue fat stranding and dermal thickening. No associated subcutaneus soft tissue emphysema; however, please note a necrotizing fasciitis cannot be excluded as this is a clinical diagnosis. Please of poor evaluation of the testes on CT, consider ultrasound for more sensitive evaluation. 3. Incidental horseshoe kidney. Electronically Signed   By: Tish Frederickson M.D.   On: 03/04/2021 23:17   DG Chest Port 1 View  Result Date: 03/04/2021 CLINICAL DATA:  Fever, weakness, groin abscess EXAM: PORTABLE CHEST 1 VIEW COMPARISON:  03/27/2010 FINDINGS: No consolidation, features of edema, pneumothorax, or effusion. Pulmonary vascularity is normally distributed. The cardiomediastinal contours are unremarkable. No acute osseous or soft tissue abnormality. IMPRESSION: No acute cardiopulmonary abnormality. Electronically Signed   By: Kreg Shropshire M.D.   On: 03/04/2021 22:08    EKG: I independently viewed the EKG done and my findings are as followed: Normal sinus rhythm at a rate of 82 bpm  Assessment/Plan Present on Admission: . Scrotal abscess . HIV disease (HCC) . Cigarette smoker  Active Problems:   HIV disease (HCC)   Cigarette smoker   Scrotal abscess   Marijuana use   Proctitis   Hypoalbuminemia due to protein-calorie malnutrition (HCC)   Proctitis and left scrotal abscess CT pelvis with contrast showed proctitis and left scrotal abscess. Patient was febrile on arrival, but this resolved with Tylenol He was started on Vanco and Zosyn, we shall continue with same at this time  Continue Tylenol 650 mg p.o. every 6 hours as needed Continue IV morphine 2 mg every 4 hours as needed for moderate/severe pain General surgery was consulted by ED PA and will see patient in the morning  Hypoalbuminemia  possibly secondary to moderate protein-calorie malnutrition Albumin 2.7; protein supplements will be provided  History of HIV disease Continue Biktarvy  Cigarettes/marijuana use Patient counseled on cigarette and marijuana use cessation   DVT prophylaxis: SCDs  Code Status: Full code  Family Communication: None at bedside  Disposition Plan:  Patient is from:                        home Anticipated DC to:                   SNF or family members home Anticipated DC date:               2-3 days Anticipated DC barriers:         patient requires inpatient management continue to left scrotal abscess pending surgical consult and possible surgical intervention   Consults called: General surgery  Admission status: Inpatient    Frankey Shown MD [facility]   03/05/2021, 4:53 AM

## 2021-03-05 NOTE — Progress Notes (Signed)
Called report to Elwood @ [facility] OR. Called report to Tiffany with [facility]. Updated patient on plan of care. Pt verbalized understanding. Will continue to monitor patient. Call light within reach.

## 2021-03-05 NOTE — Progress Notes (Signed)
Pt transferred from [facility] to [facility] for surgery. One bag of belongings left at [facility]. Notified nursing staff at [facility] that his mother would be by 03/06/21 to retrieve his belongings.

## 2021-03-05 NOTE — Op Note (Signed)
Operative Note  Preoperative diagnosis:  1.  Scrotal and thigh abscess, concern for Fournier  Postoperative diagnosis: 1.  Scrotal and thigh abscess likely secondary to hidradenitis 2.  Rectal mass  Procedure(s): 1.  Incision and drainage of scrotum and groin and left thigh with excisional debridement of scrotum 1 cm x 1 cm.  Surgeon: Modena Slater, MD  Assistants: Patience Musca  Consulting surgeon Chevis Pretty  Anesthesia: General  Complications: None immediate  EBL: 20 cc  Specimens: 1.  None  Drains/Catheters: 1.  None  Intraoperative findings: Approximately 10 cm superficial abscess tract along the entire left side of the scrotum which was opened.  There was an adjacent opening that communicated on the right scrotum.  This created a 1 x 1 cm skin bridge with unhealthy tissue that was excised.  On the thigh there were 2 communicating wounds and this cavity was opened.  This was evidence of infected hidradenitis.  He had evidence of prior hidradenitis on the right but no active drainage or abscess on the right groin/scrotum.  There was purulent discharge from the rectum on rectal exam.  Please see full dictated operative report by Dr. Carolynne Edouard for examination under anesthesia and rectal biopsy.  Indication: 25 year old male with a left-sided scrotal and groin abscess and ultrasound performed 12 hours later that showed concern for developing Fournier's.  He was transferred to [facility] for management concurrently with urology and general surgery.  Description of procedure:  The patient was identified and consent was obtained.  The patient was taken to the operating room and placed in the supine position.  The patient was placed under general anesthesia.  Perioperative antibiotics were administered.  The patient was placed in dorsal lithotomy.  Patient was prepped and draped in a standard sterile fashion and a timeout was performed.  First the superficial abscess was opened on the  left hemiscrotum/groin and electrocautery was carried down inferiorly for total incision of approximately 10 cm to open the cavity.  This was digitally probed and all pus and loculations was broken up.  There was a communication in the midline of the incision just to the right they created a 1 x 1 cm skin bridge of unhealthy tissue.  This tissue was excised and discarded.  On the thigh was another cavity.  2 fistula tracts communicated with each other and the cavity was opened in between this.  The remainder of the scrotum and thigh bilaterally were inspected.  No obvious other areas to incise and drain.  Rectal examination was performed.  There was purulent discharge from the rectum.  Dr. Carolynne Edouard was available and performed examination under anesthesia and biopsy of a rectal mass.  Please see separate dictation for full details.  After Dr. Carolynne Edouard concluded his procedure, a dressing was applied with saline soaked Kerlix.  This include the operation.  Patient tolerated the procedure well was stable postoperative.  Plan: Patient will need twice daily dressing changes.  Do not anticipate further debridement in the operating room.  Continue IV antibiotics.  Dr. Carolynne Edouard and I will discuss case with colorectal surgery tomorrow.

## 2021-03-05 NOTE — Interval H&P Note (Signed)
History and Physical Interval Note:  03/05/2021 6:39 PM  Julian Sanchez  has presented today for surgery, with the diagnosis of FOURNIER'S GANGRENE SCROTUM/THIGH.  The various methods of treatment have been discussed with the patient and family. After consideration of risks, benefits and other options for treatment, the patient has consented to  Procedure(s): IRRIGATION AND DEBRIDEMENT ABSCESS (N/A) SCROTUM EXPLORATION (N/A) as a surgical intervention.  The patient's history has been reviewed, patient examined, no change in status, stable for surgery.  I have reviewed the patient's chart and labs.  Questions were answered to the patient's satisfaction.     Ray Church, III

## 2021-03-05 NOTE — Anesthesia Procedure Notes (Signed)
Procedure Name: Intubation Date/Time: 03/05/2021 6:47 PM Performed by: Gerald Leitz, CRNA Pre-anesthesia Checklist: Patient identified, Patient being monitored, Timeout performed, Emergency Drugs available and Suction available Patient Re-evaluated:Patient Re-evaluated prior to induction Oxygen Delivery Method: Circle system utilized Preoxygenation: Pre-oxygenation with 100% oxygen Induction Type: IV induction and Rapid sequence Ventilation: Mask ventilation without difficulty Laryngoscope Size: Mac and 3 Grade View: Grade II Tube type: Oral Tube size: 7.5 mm Number of attempts: 1 Placement Confirmation: ETT inserted through vocal cords under direct vision,  positive ETCO2 and breath sounds checked- equal and bilateral Secured at: 23 cm Tube secured with: Tape Dental Injury: Teeth and Oropharynx as per pre-operative assessment

## 2021-03-06 ENCOUNTER — Encounter (HOSPITAL_COMMUNITY): Payer: Self-pay | Admitting: Urology

## 2021-03-06 DIAGNOSIS — F17210 Nicotine dependence, cigarettes, uncomplicated: Secondary | ICD-10-CM

## 2021-03-06 DIAGNOSIS — B20 Human immunodeficiency virus [HIV] disease: Secondary | ICD-10-CM

## 2021-03-06 LAB — MAGNESIUM: Magnesium: 2 mg/dL (ref 1.7–2.4)

## 2021-03-06 LAB — CBC
HCT: 33.2 % — ABNORMAL LOW (ref 39.0–52.0)
Hemoglobin: 10.1 g/dL — ABNORMAL LOW (ref 13.0–17.0)
MCH: 22.6 pg — ABNORMAL LOW (ref 26.0–34.0)
MCHC: 30.4 g/dL (ref 30.0–36.0)
MCV: 74.4 fL — ABNORMAL LOW (ref 80.0–100.0)
Platelets: 426 10*3/uL — ABNORMAL HIGH (ref 150–400)
RBC: 4.46 MIL/uL (ref 4.22–5.81)
RDW: 14.4 % (ref 11.5–15.5)
WBC: 10.2 10*3/uL (ref 4.0–10.5)
nRBC: 0 % (ref 0.0–0.2)

## 2021-03-06 LAB — BASIC METABOLIC PANEL
Anion gap: 8 (ref 5–15)
BUN: 9 mg/dL (ref 6–20)
CO2: 26 mmol/L (ref 22–32)
Calcium: 8.5 mg/dL — ABNORMAL LOW (ref 8.9–10.3)
Chloride: 99 mmol/L (ref 98–111)
Creatinine, Ser: 1.02 mg/dL (ref 0.61–1.24)
GFR, Estimated: >60 mL/min (ref >60)
Glucose, Bld: 110 mg/dL — ABNORMAL HIGH (ref 70–99)
Potassium: 3.9 mmol/L (ref 3.5–5.1)
Sodium: 133 mmol/L — ABNORMAL LOW (ref 135–145)

## 2021-03-06 MED ORDER — MUPIROCIN 2 % EX OINT
1.0000 "application " | TOPICAL_OINTMENT | Freq: Two times a day (BID) | CUTANEOUS | Status: DC
  Administered 2021-03-06 – 2021-03-09 (×6): 1 via NASAL
  Filled 2021-03-06 (×3): qty 22

## 2021-03-06 MED ORDER — NICOTINE 21 MG/24HR TD PT24
21.0000 mg | MEDICATED_PATCH | Freq: Every day | TRANSDERMAL | Status: DC
  Administered 2021-03-06: 21 mg via TRANSDERMAL
  Filled 2021-03-06 (×3): qty 1

## 2021-03-06 MED ORDER — DOCUSATE SODIUM 100 MG PO CAPS
100.0000 mg | ORAL_CAPSULE | Freq: Two times a day (BID) | ORAL | Status: DC
  Administered 2021-03-06 (×2): 100 mg via ORAL
  Filled 2021-03-06 (×6): qty 1

## 2021-03-06 MED ORDER — HYDROMORPHONE HCL 1 MG/ML IJ SOLN
0.5000 mg | Freq: Once | INTRAMUSCULAR | Status: AC
  Administered 2021-03-06: 1 mg via INTRAVENOUS
  Filled 2021-03-06: qty 1

## 2021-03-06 MED ORDER — MORPHINE SULFATE (PF) 4 MG/ML IV SOLN
4.0000 mg | INTRAVENOUS | Status: DC | PRN
  Administered 2021-03-06 – 2021-03-07 (×5): 4 mg via INTRAVENOUS
  Filled 2021-03-06 (×5): qty 1

## 2021-03-06 MED ORDER — SODIUM CHLORIDE 0.9 % IV SOLN: INTRAVENOUS | Status: AC

## 2021-03-06 MED ORDER — CHLORHEXIDINE GLUCONATE CLOTH 2 % EX PADS
6.0000 | MEDICATED_PAD | Freq: Every day | CUTANEOUS | Status: DC
  Administered 2021-03-06 – 2021-03-09 (×4): 6 via TOPICAL

## 2021-03-06 MED ORDER — ONDANSETRON HCL 4 MG/2ML IJ SOLN
4.0000 mg | INTRAMUSCULAR | Status: DC | PRN
  Administered 2021-03-07: 4 mg via INTRAVENOUS
  Filled 2021-03-06: qty 2

## 2021-03-06 MED ORDER — POLYETHYLENE GLYCOL 3350 17 G PO PACK: 17.0000 g | PACK | Freq: Every day | ORAL | Status: DC | PRN

## 2021-03-06 MED ORDER — HYDROMORPHONE HCL 1 MG/ML IJ SOLN
1.0000 mg | Freq: Once | INTRAMUSCULAR | Status: AC
Start: 2021-03-06 — End: 2021-03-06
  Administered 2021-03-06: 1 mg via INTRAVENOUS
  Filled 2021-03-06: qty 1

## 2021-03-06 NOTE — Progress Notes (Signed)
PROGRESS NOTE    EUSTACE HUR  NIO:[id number] DOB: 1996-05-31 DOA: 03/04/2021 PCP: Patient, No Pcp Per (Inactive)    Chief Complaint  Patient presents with  . Abscess    Brief Narrative:  MEKEL HAVERSTOCK is a 25 y.o. male with medical history significant for HIV who presents to the emergency department due to 3 to 4 days of recurrent groin abscess.  He complained that the area has been draining and painful on ambulation unless he widens his gait.  Patient states that he has had recurrent episodes of similar symptoms, several of which usually self resolve CT pelvis with contrast showed proctitis and left scrotal abscess Is transferred from [facility] to [facility] for urology consult  Urology consulted for Fournier gangrene, left scrotum/ thigh, s/p I/d of scrotums /groin on 5/25 Intraoperative consult for general surgery due to pus from the rectum, did not appear to have communication from the scrotum wound and thigh to rectum  Subjective:  Reports thigh/scrotum pain is not well controlled, no fever, hemodynamically stable He denies rectal pain, denies blood or pus in stool  Assessment & Plan:   Active Problems:   HIV disease (HCC)   Cigarette smoker   Scrotal abscess   Marijuana use   Proctitis   Hypoalbuminemia due to protein-calorie malnutrition (HCC)   Perineal abscess   Fournier gangrene, left scrotum/thigh -Status post I&D on May 25 by urology Dr. Alvester Morin -Twice daily wet-to-dry dressing -Culture in process -Continue current antibiotics  Intraop consult called for pus from the rectum Intraoperatively: on DRE "friable with multiple small indurated areas. There did not appear to be a communication from the open wound in the left scrotum and thigh to the deep rectum" Pathology pending will follow general surgery recommendation   Mild hyponatremia Sodium 132 Likely from dehydration Start normal saline  Microcytic anemia Appear new in 2022 Check iron  study Hemoglobin around 10 Monitor  HIV Continue home medication Appears several no-shows with ID follow-up  Cigarette smoking Nicotine patch provided   The patient's BMI is: Body mass index is 23.05 kg/m.Marland Kitchen     Unresulted Labs (From admission, onward)          Start     Ordered   03/07/21 0500  Creatinine, serum  Tomorrow morning,   R        03/06/21 1053   03/07/21 0500  CBC with Differential/Platelet  Tomorrow morning,   R        03/06/21 1627   03/07/21 0500  Basic metabolic panel  Tomorrow morning,   R        03/06/21 1627   03/06/21 1052  Stool culture (children & immunocomp patients)  Once,   R       Question:  Patient immune status  Answer:  Immunocompromised   03/06/21 1051            DVT prophylaxis: SCDs Start: 03/05/21 0135   Code Status: Full Family Communication: Patient Disposition:   Status is: Inpatient   Dispo: The patient is from: Home              Anticipated d/c is to: Home              Anticipated d/c date is: To be determined, need general surgery and urology clearance                Consultants:   General surgery  Urology  Procedures:   I&D left sacrum on 9/25  by urology Dr. Alvester MorinBell Rectum Exam under anesthesia, rectal biopsy by general surgery Dr. Carolynne Edouardoth  Antimicrobials:    Anti-infectives (From admission, onward)   Start     Dose/Rate Route Frequency Ordered Stop   03/05/21 1715  clindamycin (CLEOCIN) IVPB 600 mg        600 mg 100 mL/hr over 30 Minutes Intravenous Every 8 hours 03/05/21 1615     03/05/21 1000  bictegravir-emtricitabine-tenofovir AF (BIKTARVY) 50-200-25 MG per tablet 1 tablet        1 tablet Oral Daily 03/05/21 0542     03/05/21 0600  piperacillin-tazobactam (ZOSYN) IVPB 3.375 g        3.375 g 12.5 mL/hr over 240 Minutes Intravenous Every 8 hours 03/04/21 2344     03/05/21 0600  vancomycin (VANCOCIN) IVPB 1000 mg/200 mL premix        1,000 mg 200 mL/hr over 60 Minutes Intravenous Every 8 hours 03/04/21  2344     03/04/21 2215  vancomycin (VANCOREADY) IVPB 1500 mg/300 mL        1,500 mg 150 mL/hr over 120 Minutes Intravenous NOW 03/04/21 2206 03/05/21 0037   03/04/21 2215  piperacillin-tazobactam (ZOSYN) IVPB 3.375 g        3.375 g 100 mL/hr over 30 Minutes Intravenous  Once 03/04/21 2206 03/04/21 2301         Objective: Vitals:   03/05/21 2030 03/06/21 0042 03/06/21 0533 03/06/21 0849  BP: 116/74 112/72 118/71 114/74  Pulse: 67 63 64 76  Resp: 13 18 18 16   Temp: 98.3 F (36.8 C) 97.9 F (36.6 C) 98.4 F (36.9 C) 98.1 F (36.7 C)  TempSrc:    Oral  SpO2: 98% 100% 99% 100%  Weight:      Height:        Intake/Output Summary (Last 24 hours) at 03/06/2021 1628 Last data filed at 03/06/2021 0841 Gross per 24 hour  Intake 2325.59 ml  Output 565 ml  Net 1760.59 ml   Filed Weights   03/04/21 2044 03/05/21 1827  Weight: 77.1 kg 77.1 kg    Examination:  General exam: calm, NAD Respiratory system: Clear to auscultation. Respiratory effort normal. Cardiovascular system: S1 & S2 heard, RRR. No JVD, no murmur, No pedal edema. Gastrointestinal system: Abdomen is nondistended, soft and nontender.  Normal bowel sounds heard. Central nervous system: Alert and oriented. No focal neurological deficits. Extremities: Symmetric 5 x 5 power. Skin: surgical wound exam deferred  Psychiatry: Judgement and insight appear normal. Mood & affect appropriate.     Data Reviewed: I have personally reviewed following labs and imaging studies  CBC: Recent Labs  Lab 03/04/21 2150 03/05/21 0559 03/06/21 1157  WBC 10.6* 10.3 10.2  NEUTROABS 8.1*  --   --   HGB 10.4* 9.5* 10.1*  HCT 31.4* 31.8* 33.2*  MCV 80.9 76.3* 74.4*  PLT 411* 363 426*    Basic Metabolic Panel: Recent Labs  Lab 03/04/21 2150 03/05/21 0559 03/06/21 1157  NA 133* 132* 133*  K 3.5 3.7 3.9  CL 100 101 99  CO2 26 25 26   GLUCOSE 86 88 110*  BUN 6 6 9   CREATININE 0.79 0.76 1.02  CALCIUM 8.8* 8.4* 8.5*  MG  --   1.7 2.0  PHOS  --  4.0  --     GFR: Estimated Creatinine Clearance: 118.6 mL/min (by C-G formula based on SCr of 1.02 mg/dL).  Liver Function Tests: Recent Labs  Lab 03/04/21 2150 03/05/21 0559  AST 16 12*  ALT 11 9  ALKPHOS 73 62  BILITOT 0.4 0.4  PROT 9.0* 8.2*  ALBUMIN 2.7* 2.3*    CBG: No results for input(s): GLUCAP in the last 168 hours.   Recent Results (from the past 240 hour(s))  Culture, blood (routine x 2)     Status: None (Preliminary result)   Collection Time: 03/04/21  9:08 PM   Specimen: BLOOD  Result Value Ref Range Status   Specimen Description BLOOD LEFT ANTECUBITAL  Final   Special Requests   Final    BOTTLES DRAWN AEROBIC AND ANAEROBIC Blood Culture adequate volume   Culture   Final    NO GROWTH 2 DAYS Performed at [facility], [address] [place], [place] [address]    Report Status PENDING  Incomplete  Aerobic Culture w Gram Stain (superficial specimen)     Status: None (Preliminary result)   Collection Time: 03/04/21  9:44 PM   Specimen: Abscess; Wound  Result Value Ref Range Status   Specimen Description   Final    ABSCESS Performed at [facility], [address], [place], [place] [address]    Special Requests   Final    RIGHT GROIN Performed at [facility], [address], [place], [place] [address]    Gram Stain   Final    NO WBC SEEN FEW GRAM POSITIVE COCCI FEW GRAM POSITIVE RODS    Culture   Final    CULTURE REINCUBATED FOR BETTER GROWTH Performed at [facility], [address] [place], [place] [address]    Report Status PENDING  Incomplete  Culture, blood (routine x 2)     Status: None (Preliminary result)   Collection Time: 03/04/21  9:50 PM   Specimen: BLOOD  Result Value Ref Range Status   Specimen Description BLOOD RIGHT ANTECUBITAL  Final   Special Requests   Final    BOTTLES DRAWN AEROBIC AND ANAEROBIC Blood Culture adequate volume   Culture   Final    NO GROWTH 2 DAYS Performed at [facility],  [address] [place], [place] [address]    Report Status PENDING  Incomplete  Resp Panel by RT-PCR (Flu A&B, Covid) Groin, Right     Status: None   Collection Time: 03/04/21 10:01 PM   Specimen: Groin, Right; Nasopharyngeal(NP) swabs in vial transport medium  Result Value Ref Range Status   SARS Coronavirus 2 by RT PCR NEGATIVE NEGATIVE Final    Comment: (NOTE) SARS-CoV-2 target nucleic acids are NOT DETECTED.  The SARS-CoV-2 RNA is generally detectable in upper respiratory specimens during the acute phase of infection. The lowest concentration of SARS-CoV-2 viral copies this assay can detect is 138 copies/mL. A negative result does not preclude SARS-Cov-2 infection and should not be used as the sole basis for treatment or other patient management decisions. A negative result may occur with  improper specimen collection/handling, submission of specimen other than nasopharyngeal swab, presence of viral mutation(s) within the areas targeted by this assay, and inadequate number of viral copies(<138 copies/mL). A negative result must be combined with clinical observations, patient history, and epidemiological information. The expected result is Negative.  Fact Sheet for Patients:  BloggerCourse.com  Fact Sheet for Healthcare Providers:  SeriousBroker.it  This test is no t yet approved or cleared by the [place] FDA and  has been authorized for detection and/or diagnosis of SARS-CoV-2 by FDA under an Emergency Use Authorization (EUA). This EUA will remain  in effect (meaning this test can be used)  for the duration of the COVID-19 declaration under Section 564(b)(1) of the Act, 21 U.S.C.section 360bbb-3(b)(1), unless the authorization is terminated  or revoked sooner.       Influenza A by PCR NEGATIVE NEGATIVE Final   Influenza B by PCR NEGATIVE NEGATIVE Final    Comment: (NOTE) The Xpert Xpress SARS-CoV-2/FLU/RSV plus assay is  intended as an aid in the diagnosis of influenza from Nasopharyngeal swab specimens and should not be used as a sole basis for treatment. Nasal washings and aspirates are unacceptable for Xpert Xpress SARS-CoV-2/FLU/RSV testing.  Fact Sheet for Patients: BloggerCourse.com  Fact Sheet for Healthcare Providers: SeriousBroker.it  This test is not yet approved or cleared by the [place] FDA and has been authorized for detection and/or diagnosis of SARS-CoV-2 by FDA under an Emergency Use Authorization (EUA). This EUA will remain in effect (meaning this test can be used) for the duration of the COVID-19 declaration under Section 564(b)(1) of the Act, 21 U.S.C. section 360bbb-3(b)(1), unless the authorization is terminated or revoked.  Performed at [facility], [address] [place], [place] [address]   MRSA PCR Screening     Status: Abnormal   Collection Time: 03/05/21  8:36 AM   Specimen: Nasopharyngeal  Result Value Ref Range Status   MRSA by PCR POSITIVE (A) NEGATIVE Final    Comment:        The GeneXpert MRSA Assay (FDA approved for NASAL specimens only), is one component of a comprehensive MRSA colonization surveillance program. It is not intended to diagnose MRSA infection nor to guide or monitor treatment for MRSA infections. RESULT CALLED TO, READ BACK BY AND VERIFIED WITH: WATSON,K AT 1405 BY HUFFINES,S ON 03/05/21. Performed at [facility], [address] [place], [place] [address]          Radiology Studies: CT PELVIS W CONTRAST  Result Date: 03/04/2021 CLINICAL DATA:  abscess to left groin for past 3 days with drainage. Pt denies any fevers or N/V. More painful today. After IV dye injeciton pt. Rolled onto side and started vomiting. Initial scan delayed due to vomiting. EXAM: CT PELVIS WITH CONTRAST TECHNIQUE: Multidetector CT imaging of the pelvis was performed using the standard protocol following the  bolus administration of intravenous contrast. CONTRAST:  OMNIPAQUE IOHEXOL 300 MG/ML  SOLN COMPARISON:  CT abdomen pelvis 03/05/2020 FINDINGS: Urinary Tract: Partially visualized horseshoe kidney. Otherwise unremarkable. Bowel: The rectal wall is thickened with mucosal hyperemia and limited evaluation for perirectal abscess due to timing of intravenous contrast. Perirectal fat stranding. Vascular/Lymphatic: No pathologically enlarged lymph nodes. No significant vascular abnormality seen. Reproductive:  No mass or other significant abnormality Other: No intraperitoneal free fluid. No intraperitoneal free gas. No organized fluid collection. other Musculoskeletal: Abscess formation with the largest pocket of 1.8 x 1.4 cm centered along the lateral superior left scrotum. There is extension of the abscess posteriorly along the perineum with a thin elongated 10 x 1 cm fluid collection. There is also extension of the abscess superiorly along the spermatic cord within other pocket of fluid measuring 1.7 x 1.2 cm (2:50). Another tiny pocket of fluid is noted along the medial posterior left thigh measuring 1.1 x 0.6 cm (2:63), this is also noted to be contiguous with the other fluid collections. Associated subcutaneus soft tissue edema and dermal thickening noted. No subcutaneus soft tissue emphysema. No cortical erosion or destruction. No suspicious lytic or blastic osseous lesions. No acute displaced fracture. IMPRESSION: 1. Proctitis.  Underlying malignancy not excluded. 2. Left scrotal  abscess formation measuring up to 1.8 x 1.4 cm that is contiguous with a couple of other fluid collections that extend posteriorly toward the perineum, superiorly along the spermatic cord, and laterally to the medial left superior thigh. Associated subcutaneus soft tissue fat stranding and dermal thickening. No associated subcutaneus soft tissue emphysema; however, please note a necrotizing fasciitis cannot be excluded as this is a  clinical diagnosis. Please of poor evaluation of the testes on CT, consider ultrasound for more sensitive evaluation. 3. Incidental horseshoe kidney. Electronically Signed   By: Tish Frederickson M.D.   On: 03/04/2021 23:17   DG Chest Port 1 View  Result Date: 03/04/2021 CLINICAL DATA:  Fever, weakness, groin abscess EXAM: PORTABLE CHEST 1 VIEW COMPARISON:  03/27/2010 FINDINGS: No consolidation, features of edema, pneumothorax, or effusion. Pulmonary vascularity is normally distributed. The cardiomediastinal contours are unremarkable. No acute osseous or soft tissue abnormality. IMPRESSION: No acute cardiopulmonary abnormality. Electronically Signed   By: Kreg Shropshire M.D.   On: 03/04/2021 22:08   US SCROTUM W/DOPPLER  Addendum Date: 03/05/2021   ADDENDUM REPORT: 03/05/2021 13:15 ADDENDUM: Discussed with Dr. Henreitta Leber on 03/05/2021 at 1315 hours. Electronically Signed   By: Ulyses Southward M.D.   On: 03/05/2021 13:15   Result Date: 03/05/2021 CLINICAL DATA:  LEFT scrotal abscess, symptoms for 3 months EXAM: SCROTAL ULTRASOUND DOPPLER ULTRASOUND OF THE TESTICLES TECHNIQUE: Complete ultrasound examination of the testicles, epididymis, and other scrotal structures was performed. Color and spectral Doppler ultrasound were also utilized to evaluate blood flow to the testicles. COMPARISON:  CT pelvis 03/04/2021 FINDINGS: Right testicle Measurements: 3.7 x 1.9 x 2.5 cm. Normal echogenicity without mass or calcification. Internal blood flow present on color Doppler imaging. Left testicle Measurements: 3.41.5 x 2.5 cm. Normal echogenicity without mass or calcification. Internal blood flow present on color Doppler imaging. Right epididymis:  Normal in size and appearance. Left epididymis: Normal in size and appearance. Slightly hypervascular. Hydrocele:  Trace RIGHT hydrocele fluid Varicocele:  None visualized. Pulsed Doppler interrogation of both testes demonstrates normal low resistance arterial and venous waveforms  bilaterally. Extensive soft tissue swelling of LEFT hemiscrotum extending into LEFT groin and LEFT perineum. Multiple loculated fluid collections are seen consistent with abscesses. A more anterior superior abscess measures 1.6 cm. A second collection extending posteriorly towards the perineum and anus measures 10 mm diameter and 3.3 cm length. A third more posterior collection measures 1.3 cm diameter. A few scattered foci of bright punctate hyperechogenicity are seen, particularly within the small abscess collections, without definite shadowing; these are concerning for developing foci of soft tissue gas/pneumatosis. Reactive LEFT inguinal lymph nodes identified IMPRESSION: Normal appearing testes and epididymi. Significant soft tissue swelling LEFT hemiscrotum extending into LEFT groin and posteriorly into perineum to perianal region. Focal complex collections are seen within the area of soft tissue edema consistent with abscesses, with 3 discrete collections measuring 1.6 cm, 3.3 cm, and 1.3 cm in greatest sizes. Both of these collections contain foci of punctate hyperechogenicity concerning for gas. 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 Critical Value/emergent results were called by telephone at the time of interpretation on 03/05/2021 at 12:33 pm to provider [facility] , who verbally acknowledged these results. Electronically Signed: By: Ulyses Southward M.D. On: 03/05/2021 12:36        Scheduled Meds: . bictegravir-emtricitabine-tenofovir AF  1 tablet Oral Daily  . Chlorhexidine Gluconate Cloth  6 each Topical Q0600  . docusate sodium  100 mg Oral BID  . feeding supplement  237 mL Oral BID BM  . mupirocin ointment  1 application Nasal BID  . nicotine  21 mg Transdermal Daily   Continuous Infusions: . sodium chloride    . clindamycin (CLEOCIN) IV 600 mg (03/06/21 1326)  . piperacillin-tazobactam (ZOSYN)  IV 3.375 g (03/06/21 1328)  . vancomycin 1,000 mg (03/06/21 0539)      LOS: 1 day   Time spent: Greater than 50% of this time was spent in counseling, explanation of diagnosis, planning of further management, and coordination of care.   Voice Recognition Reubin [place] dictation system was used to create this note, attempts have been made to correct errors. Please contact the author with questions and/or clarifications.   Albertine Grates, MD PhD FACP [facility]  Available via Epic secure chat 7am-7pm for nonurgent issues Please page for urgent issues To page the attending provider between 7A-7P or the covering provider during after hours 7P-7A, please log into the web site [email or web address] and access using universal [place] password for that web site. If you do not have the password, please call the hospital operator.    03/06/2021, 4:28 PM

## 2021-03-06 NOTE — Progress Notes (Signed)
[facility] Progress Note  1 Day Post-Op  Subjective: CC-  Pain with dressing change this morning, otherwise no complaints. Denies any perirectal pain or drainage.   Objective: Vital signs in last 24 hours: Temp:  [97.6 F (36.4 C)-99.7 F (37.6 C)] 98.1 F (36.7 C) (05/26 0849) Pulse Rate:  [63-86] 76 (05/26 0849) Resp:  [10-20] 16 (05/26 0849) BP: (112-142)/(71-86) 114/74 (05/26 0849) SpO2:  [98 %-100 %] 100 % (05/26 0849) Weight:  [77.1 kg] 77.1 kg (05/25 1827) Last BM Date: 03/05/21  Intake/Output from previous day: 05/25 0701 - 05/26 0700 In: 2296.4 [I.V.:1500; IV Piggyback:796.4] Out: 565 [Urine:550; Blood:15] Intake/Output this shift: Total I/O In: 240 [P.O.:240] Out: -   PE: Gen:  Alert, NAD, pleasant Pulm:  rate and effort normal Abd: Soft, NT/ND GU: cdi dressing to left groin; no areas of fluctuance, drainage, or erythema noted to perirectal area  Lab Results:  Recent Labs    03/04/21 2150 03/05/21 0559  WBC 10.6* 10.3  HGB 10.4* 9.5*  HCT 31.4* 31.8*  PLT 411* 363   BMET Recent Labs    03/04/21 2150 03/05/21 0559  NA 133* 132*  K 3.5 3.7  CL 100 101  CO2 26 25  GLUCOSE 86 88  BUN 6 6  CREATININE 0.79 0.76  CALCIUM 8.8* 8.4*   PT/INR Recent Labs    03/05/21 0559  LABPROT 15.2  INR 1.2   CMP     Component Value Date/Time   NA 132 (L) 03/05/2021 0559   K 3.7 03/05/2021 0559   CL 101 03/05/2021 0559   CO2 25 03/05/2021 0559   GLUCOSE 88 03/05/2021 0559   BUN 6 03/05/2021 0559   CREATININE 0.76 03/05/2021 0559   CREATININE 0.99 02/28/2019 1415   CALCIUM 8.4 (L) 03/05/2021 0559   PROT 8.2 (H) 03/05/2021 0559   ALBUMIN 2.3 (L) 03/05/2021 0559   AST 12 (L) 03/05/2021 0559   ALT 9 03/05/2021 0559   ALKPHOS 62 03/05/2021 0559   BILITOT 0.4 03/05/2021 0559   GFRNONAA >60 03/05/2021 0559   GFRNONAA 105 02/28/2019 1415   GFRAA >60 03/05/2020 0955   GFRAA 122 02/28/2019 1415   Lipase     Component Value Date/Time    LIPASE 23 09/28/2015 1413       Studies/Results: CT PELVIS W CONTRAST  Result Date: 03/04/2021 CLINICAL DATA:  abscess to left groin for past 3 days with drainage. Pt denies any fevers or N/V. More painful today. After IV dye injeciton pt. Rolled onto side and started vomiting. Initial scan delayed due to vomiting. EXAM: CT PELVIS WITH CONTRAST TECHNIQUE: Multidetector CT imaging of the pelvis was performed using the standard protocol following the bolus administration of intravenous contrast. CONTRAST:  OMNIPAQUE IOHEXOL 300 MG/ML  SOLN COMPARISON:  CT abdomen pelvis 03/05/2020 FINDINGS: Urinary Tract: Partially visualized horseshoe kidney. Otherwise unremarkable. Bowel: The rectal wall is thickened with mucosal hyperemia and limited evaluation for perirectal abscess due to timing of intravenous contrast. Perirectal fat stranding. Vascular/Lymphatic: No pathologically enlarged lymph nodes. No significant vascular abnormality seen. Reproductive:  No mass or other significant abnormality Other: No intraperitoneal free fluid. No intraperitoneal free gas. No organized fluid collection. other Musculoskeletal: Abscess formation with the largest pocket of 1.8 x 1.4 cm centered along the lateral superior left scrotum. There is extension of the abscess posteriorly along the perineum with a thin elongated 10 x 1 cm fluid collection. There is also extension of the abscess superiorly along the spermatic cord  within other pocket of fluid measuring 1.7 x 1.2 cm (2:50). Another tiny pocket of fluid is noted along the medial posterior left thigh measuring 1.1 x 0.6 cm (2:63), this is also noted to be contiguous with the other fluid collections. Associated subcutaneus soft tissue edema and dermal thickening noted. No subcutaneus soft tissue emphysema. No cortical erosion or destruction. No suspicious lytic or blastic osseous lesions. No acute displaced fracture. IMPRESSION: 1. Proctitis.  Underlying malignancy not  excluded. 2. Left scrotal abscess formation measuring up to 1.8 x 1.4 cm that is contiguous with a couple of other fluid collections that extend posteriorly toward the perineum, superiorly along the spermatic cord, and laterally to the medial left superior thigh. Associated subcutaneus soft tissue fat stranding and dermal thickening. No associated subcutaneus soft tissue emphysema; however, please note a necrotizing fasciitis cannot be excluded as this is a clinical diagnosis. Please of poor evaluation of the testes on CT, consider ultrasound for more sensitive evaluation. 3. Incidental horseshoe kidney. Electronically Signed   By: Tish Frederickson M.D.   On: 03/04/2021 23:17   DG Chest Port 1 View  Result Date: 03/04/2021 CLINICAL DATA:  Fever, weakness, groin abscess EXAM: PORTABLE CHEST 1 VIEW COMPARISON:  03/27/2010 FINDINGS: No consolidation, features of edema, pneumothorax, or effusion. Pulmonary vascularity is normally distributed. The cardiomediastinal contours are unremarkable. No acute osseous or soft tissue abnormality. IMPRESSION: No acute cardiopulmonary abnormality. Electronically Signed   By: Kreg Shropshire M.D.   On: 03/04/2021 22:08   US SCROTUM W/DOPPLER  Addendum Date: 03/05/2021   ADDENDUM REPORT: 03/05/2021 13:15 ADDENDUM: Discussed with Dr. Henreitta Leber on 03/05/2021 at 1315 hours. Electronically Signed   By: Ulyses Southward M.D.   On: 03/05/2021 13:15   Result Date: 03/05/2021 CLINICAL DATA:  LEFT scrotal abscess, symptoms for 3 months EXAM: SCROTAL ULTRASOUND DOPPLER ULTRASOUND OF THE TESTICLES TECHNIQUE: Complete ultrasound examination of the testicles, epididymis, and other scrotal structures was performed. Color and spectral Doppler ultrasound were also utilized to evaluate blood flow to the testicles. COMPARISON:  CT pelvis 03/04/2021 FINDINGS: Right testicle Measurements: 3.7 x 1.9 x 2.5 cm. Normal echogenicity without mass or calcification. Internal blood flow present on color Doppler  imaging. Left testicle Measurements: 3.41.5 x 2.5 cm. Normal echogenicity without mass or calcification. Internal blood flow present on color Doppler imaging. Right epididymis:  Normal in size and appearance. Left epididymis: Normal in size and appearance. Slightly hypervascular. Hydrocele:  Trace RIGHT hydrocele fluid Varicocele:  None visualized. Pulsed Doppler interrogation of both testes demonstrates normal low resistance arterial and venous waveforms bilaterally. Extensive soft tissue swelling of LEFT hemiscrotum extending into LEFT groin and LEFT perineum. Multiple loculated fluid collections are seen consistent with abscesses. A more anterior superior abscess measures 1.6 cm. A second collection extending posteriorly towards the perineum and anus measures 10 mm diameter and 3.3 cm length. A third more posterior collection measures 1.3 cm diameter. A few scattered foci of bright punctate hyperechogenicity are seen, particularly within the small abscess collections, without definite shadowing; these are concerning for developing foci of soft tissue gas/pneumatosis. Reactive LEFT inguinal lymph nodes identified IMPRESSION: Normal appearing testes and epididymi. Significant soft tissue swelling LEFT hemiscrotum extending into LEFT groin and posteriorly into perineum to perianal region. Focal complex collections are seen within the area of soft tissue edema consistent with abscesses, with 3 discrete collections measuring 1.6 cm, 3.3 cm, and 1.3 cm in greatest sizes. Both of these collections contain foci of punctate hyperechogenicity concerning for gas. Findings are  highly suspicious for Fournier's gangrene. Critical Value/emergent results were called by telephone at the time of interpretation on 03/05/2021 at 12:33 pm to provider [facility] , who verbally acknowledged these results. Electronically Signed: By: Ulyses Southward M.D. On: 03/05/2021 12:36    Anti-infectives: Anti-infectives (From admission,  onward)   Start     Dose/Rate Route Frequency Ordered Stop   03/05/21 1715  clindamycin (CLEOCIN) IVPB 600 mg        600 mg 100 mL/hr over 30 Minutes Intravenous Every 8 hours 03/05/21 1615     03/05/21 1000  bictegravir-emtricitabine-tenofovir AF (BIKTARVY) 50-200-25 MG per tablet 1 tablet        1 tablet Oral Daily 03/05/21 0542     03/05/21 0600  piperacillin-tazobactam (ZOSYN) IVPB 3.375 g        3.375 g 12.5 mL/hr over 240 Minutes Intravenous Every 8 hours 03/04/21 2344     03/05/21 0600  vancomycin (VANCOCIN) IVPB 1000 mg/200 mL premix        1,000 mg 200 mL/hr over 60 Minutes Intravenous Every 8 hours 03/04/21 2344     03/04/21 2215  vancomycin (VANCOREADY) IVPB 1500 mg/300 mL        1,500 mg 150 mL/hr over 120 Minutes Intravenous NOW 03/04/21 2206 03/05/21 0037   03/04/21 2215  piperacillin-tazobactam (ZOSYN) IVPB 3.375 g        3.375 g 100 mL/hr over 30 Minutes Intravenous  Once 03/04/21 2206 03/04/21 2301       Assessment/Plan HIV - followed by Dr. Orvan Falconer, admits that he is not totally compliant with Biktarvy, last CD4 count 325 (03/05/20) Tobacco abuse  FOURNIER'S GANGRENE SCROTUM/THIGH -S/p Incision and drainage of scrotum and groin and left thigh with excisional debridement of scrotum 1 cm x 1 cm 5/25 Dr. Alvester Morin - culture sent in ED 5/24, gram stain with FEW GRAM POSITIVE COCCI and GRAM POSITIVE RODS, report pending - continue broad spectrum antibiotics - wound care per urology  -S/p Exam under anesthesia, rectal biopsy 5/25 Dr. Carolynne Edouard - Intraop consult called for pus from the rectum. Intraoperatively: on DRE "friable with multiple small indurated areas. There did not appear to be a communication from the open wound in the left scrotum and thigh to the deep rectum" - path pending - check stool cultures - Will monitor progress. If patient fails to improve with antibiotics and wound care may consider repeat EUA to evaluate for fistula. However, today he appears well and  there does not appear to be any drainable perirectal fluid collections  ID - zosyn 5/24>>, clindamycin 5/25>>, vancomycin 5/24>> FEN - reg diet VTE - ok for chemical DVT prophylaxis from surgical standpoint Foley - none   LOS: 1 day    Julian Sanchez, [facility] 03/06/2021, 10:52 AM Please see Amion for pager number during day hours 7:00am-4:30pm

## 2021-03-06 NOTE — Progress Notes (Signed)
Urology Progress Note   1 Day Post-Op  Subjective: NAEON.  Afebrile, HDS Pain controlled at baseline, requires IV pain control with dressing changes  Objective: Vital signs in last 24 hours: Temp:  [97.6 F (36.4 C)-99.7 F (37.6 C)] 98.4 F (36.9 C) (05/26 0533) Pulse Rate:  [63-86] 64 (05/26 0533) Resp:  [10-20] 18 (05/26 0533) BP: (112-142)/(71-86) 118/71 (05/26 0533) SpO2:  [98 %-100 %] 99 % (05/26 0533) Weight:  [77.1 kg] 77.1 kg (05/25 1827)  Intake/Output from previous day: 05/25 0701 - 05/26 0700 In: 2296.4 [I.V.:1500; IV Piggyback:796.4] Out: 565 [Urine:550; Blood:15] Intake/Output this shift: No intake/output data recorded.  Physical Exam:  General: Alert and oriented CV: RRR Lungs: Clear Abdomen: Soft, appropriately tender. Incisions c/d/i. JP SS GU: voiding spontaneously. Dressings in groin intact, no new areas of fluctuance or drainage noted when compared to OR last night Ext: NT, No erythema  Lab Results: Recent Labs    03/04/21 2150 03/05/21 0559  HGB 10.4* 9.5*  HCT 31.4* 31.8*   BMET Recent Labs    03/04/21 2150 03/05/21 0559  NA 133* 132*  K 3.5 3.7  CL 100 101  CO2 26 25  GLUCOSE 86 88  BUN 6 6  CREATININE 0.79 0.76  CALCIUM 8.8* 8.4*     Studies/Results: CT PELVIS W CONTRAST  Result Date: 03/04/2021 CLINICAL DATA:  abscess to left groin for past 3 days with drainage. Pt denies any fevers or N/V. More painful today. After IV dye injeciton pt. Rolled onto side and started vomiting. Initial scan delayed due to vomiting. EXAM: CT PELVIS WITH CONTRAST TECHNIQUE: Multidetector CT imaging of the pelvis was performed using the standard protocol following the bolus administration of intravenous contrast. CONTRAST:  OMNIPAQUE IOHEXOL 300 MG/ML  SOLN COMPARISON:  CT abdomen pelvis 03/05/2020 FINDINGS: Urinary Tract: Partially visualized horseshoe kidney. Otherwise unremarkable. Bowel: The rectal wall is thickened with mucosal hyperemia and  limited evaluation for perirectal abscess due to timing of intravenous contrast. Perirectal fat stranding. Vascular/Lymphatic: No pathologically enlarged lymph nodes. No significant vascular abnormality seen. Reproductive:  No mass or other significant abnormality Other: No intraperitoneal free fluid. No intraperitoneal free gas. No organized fluid collection. other Musculoskeletal: Abscess formation with the largest pocket of 1.8 x 1.4 cm centered along the lateral superior left scrotum. There is extension of the abscess posteriorly along the perineum with a thin elongated 10 x 1 cm fluid collection. There is also extension of the abscess superiorly along the spermatic cord within other pocket of fluid measuring 1.7 x 1.2 cm (2:50). Another tiny pocket of fluid is noted along the medial posterior left thigh measuring 1.1 x 0.6 cm (2:63), this is also noted to be contiguous with the other fluid collections. Associated subcutaneus soft tissue edema and dermal thickening noted. No subcutaneus soft tissue emphysema. No cortical erosion or destruction. No suspicious lytic or blastic osseous lesions. No acute displaced fracture. IMPRESSION: 1. Proctitis.  Underlying malignancy not excluded. 2. Left scrotal abscess formation measuring up to 1.8 x 1.4 cm that is contiguous with a couple of other fluid collections that extend posteriorly toward the perineum, superiorly along the spermatic cord, and laterally to the medial left superior thigh. Associated subcutaneus soft tissue fat stranding and dermal thickening. No associated subcutaneus soft tissue emphysema; however, please note a necrotizing fasciitis cannot be excluded as this is a clinical diagnosis. Please of poor evaluation of the testes on CT, consider ultrasound for more sensitive evaluation. 3. Incidental horseshoe kidney. Electronically Signed  By: Tish Frederickson M.D.   On: 03/04/2021 23:17   DG Chest Port 1 View  Result Date: 03/04/2021 CLINICAL DATA:   Fever, weakness, groin abscess EXAM: PORTABLE CHEST 1 VIEW COMPARISON:  03/27/2010 FINDINGS: No consolidation, features of edema, pneumothorax, or effusion. Pulmonary vascularity is normally distributed. The cardiomediastinal contours are unremarkable. No acute osseous or soft tissue abnormality. IMPRESSION: No acute cardiopulmonary abnormality. Electronically Signed   By: Kreg Shropshire M.D.   On: 03/04/2021 22:08   US SCROTUM W/DOPPLER  Addendum Date: 03/05/2021   ADDENDUM REPORT: 03/05/2021 13:15 ADDENDUM: Discussed with Dr. Henreitta Leber on 03/05/2021 at 1315 hours. Electronically Signed   By: Ulyses Southward M.D.   On: 03/05/2021 13:15   Result Date: 03/05/2021 CLINICAL DATA:  LEFT scrotal abscess, symptoms for 3 months EXAM: SCROTAL ULTRASOUND DOPPLER ULTRASOUND OF THE TESTICLES TECHNIQUE: Complete ultrasound examination of the testicles, epididymis, and other scrotal structures was performed. Color and spectral Doppler ultrasound were also utilized to evaluate blood flow to the testicles. COMPARISON:  CT pelvis 03/04/2021 FINDINGS: Right testicle Measurements: 3.7 x 1.9 x 2.5 cm. Normal echogenicity without mass or calcification. Internal blood flow present on color Doppler imaging. Left testicle Measurements: 3.41.5 x 2.5 cm. Normal echogenicity without mass or calcification. Internal blood flow present on color Doppler imaging. Right epididymis:  Normal in size and appearance. Left epididymis: Normal in size and appearance. Slightly hypervascular. Hydrocele:  Trace RIGHT hydrocele fluid Varicocele:  None visualized. Pulsed Doppler interrogation of both testes demonstrates normal low resistance arterial and venous waveforms bilaterally. Extensive soft tissue swelling of LEFT hemiscrotum extending into LEFT groin and LEFT perineum. Multiple loculated fluid collections are seen consistent with abscesses. A more anterior superior abscess measures 1.6 cm. A second collection extending posteriorly towards the perineum  and anus measures 10 mm diameter and 3.3 cm length. A third more posterior collection measures 1.3 cm diameter. A few scattered foci of bright punctate hyperechogenicity are seen, particularly within the small abscess collections, without definite shadowing; these are concerning for developing foci of soft tissue gas/pneumatosis. Reactive LEFT inguinal lymph nodes identified IMPRESSION: Normal appearing testes and epididymi. Significant soft tissue swelling LEFT hemiscrotum extending into LEFT groin and posteriorly into perineum to perianal region. Focal complex collections are seen within the area of soft tissue edema consistent with abscesses, with 3 discrete collections measuring 1.6 cm, 3.3 cm, and 1.3 cm in greatest sizes. Both of these collections contain foci of punctate hyperechogenicity concerning for gas. 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 Critical Value/emergent results were called by telephone at the time of interpretation on 03/05/2021 at 12:33 pm to provider [facility] , who verbally acknowledged these results. Electronically Signed: By: Ulyses Southward M.D. On: 03/05/2021 12:36    Assessment/Plan:  25 y.o. male with HIV, likely extensive groin/perirectal HS s/p I&D of groin abscess tracts on 5/25 with Dr. Alvester Morin   - no further acute intervention recommended by urology at this time, area is appropriately draining and there are no new concerning areas requiring debridement - appreciate colorectal involvement given rectal tracts/purulence  - continue BID WTD dressing changes of groin - pain control as needed - please start teaching patient how to perform dressing changes  - agree with continued antibiotics at this time       LOS: 1 day   Cindy Brindisi 03/06/2021, 7:48 AM

## 2021-03-07 LAB — CBC WITH DIFFERENTIAL/PLATELET
Abs Immature Granulocytes: 0.03 10*3/uL (ref 0.00–0.07)
Basophils Absolute: 0 10*3/uL (ref 0.0–0.1)
Basophils Relative: 0 %
Eosinophils Absolute: 0 10*3/uL (ref 0.0–0.5)
Eosinophils Relative: 1 %
HCT: 31.3 % — ABNORMAL LOW (ref 39.0–52.0)
Hemoglobin: 9.3 g/dL — ABNORMAL LOW (ref 13.0–17.0)
Immature Granulocytes: 1 %
Lymphocytes Relative: 25 %
Lymphs Abs: 1.5 10*3/uL (ref 0.7–4.0)
MCH: 22.2 pg — ABNORMAL LOW (ref 26.0–34.0)
MCHC: 29.7 g/dL — ABNORMAL LOW (ref 30.0–36.0)
MCV: 74.9 fL — ABNORMAL LOW (ref 80.0–100.0)
Monocytes Absolute: 0.6 10*3/uL (ref 0.1–1.0)
Monocytes Relative: 10 %
Neutro Abs: 3.8 10*3/uL (ref 1.7–7.7)
Neutrophils Relative %: 63 %
Platelets: 376 10*3/uL (ref 150–400)
RBC: 4.18 MIL/uL — ABNORMAL LOW (ref 4.22–5.81)
RDW: 14.5 % (ref 11.5–15.5)
WBC: 6 10*3/uL (ref 4.0–10.5)
nRBC: 0 % (ref 0.0–0.2)

## 2021-03-07 LAB — BASIC METABOLIC PANEL
Anion gap: 4 — ABNORMAL LOW (ref 5–15)
BUN: 10 mg/dL (ref 6–20)
CO2: 30 mmol/L (ref 22–32)
Calcium: 8.9 mg/dL (ref 8.9–10.3)
Chloride: 105 mmol/L (ref 98–111)
Creatinine, Ser: 0.84 mg/dL (ref 0.61–1.24)
GFR, Estimated: >60 mL/min (ref >60)
Glucose, Bld: 89 mg/dL (ref 70–99)
Potassium: 3.8 mmol/L (ref 3.5–5.1)
Sodium: 139 mmol/L (ref 135–145)

## 2021-03-07 NOTE — Progress Notes (Signed)
PROGRESS NOTE    Julian RepressDevonte R Sanchez  MVH:[id number] DOB: 10/16/1995 DOA: 03/04/2021 PCP: Patient, No Pcp Per (Inactive)    Chief Complaint  Patient presents with  . Abscess    Brief Narrative:  Julian RepressDevonte R Mandt is a [age] y.o. male with medical history significant for HIV who presents to the emergency department due to 3 to 4 days of recurrent groin abscess.  He complained that the area has been draining and painful on ambulation unless he widens his gait.  Patient states that he has had recurrent episodes of similar symptoms, several of which usually self resolve CT pelvis with contrast showed proctitis and left scrotal abscess [facility] for [facility]rology consult  Urology consulted for Fournier gangrene, left scrotum/ thigh, s/p I/d of scrotums /groin on 5/25 Intraoperative consult for general surgery due to pus from the rectum, did not appear to have communication from the scrotum wound and thigh to rectum  Subjective:  pain is better controlled, no fever, hemodynamically stable He denies rectal pain, denies blood or pus in stool  Assessment & Plan:   Active Problems:   HIV disease (HCC)   Cigarette smoker   Scrotal abscess   Marijuana use   Proctitis   Hypoalbuminemia due to protein-calorie malnutrition (HCC)   Perineal abscess   Fournier gangrene, left scrotum/thigh -Status post I&D on May 25 by urology Dr. Alvester MorinBell -Twice daily wet-to-dry dressing -Culture in process -Continue current antibiotics  Intraop consult called for pus from the rectum Intraoperatively: on DRE "friable with multiple small indurated areas. There did not appear to be a communication from the open wound in the left scrotum and thigh to the deep rectum" Pathology pending will follow general surgery recommendation  Proctitis General surgery recommend GI consult   Mild hyponatremia Sodium 132 Likely from dehydration Normalized with hydration , adequate oral intake, DC  hydration  Microcytic anemia Appear new in 2022 Check iron study Hemoglobin around 10 Monitor  HIV Continue home medication Appears several no-shows with ID follow-up  Cigarette smoking Nicotine patch provided   The patient's BMI is: Body mass index is 23.05 kg/m.Marland Kitchen.     Unresulted Labs (From admission, onward)          Start     Ordered   03/08/21 0830  Vancomycin, trough  Once-Timed,   TIMED        03/07/21 1410   03/08/21 0500  Creatinine, serum  Tomorrow morning,   R        03/07/21 1421   03/08/21 0500  Iron and TIBC  Tomorrow morning,   R        03/07/21 1426   03/08/21 0500  CBC with Differential/Platelet  Tomorrow morning,   R        03/07/21 1426   03/08/21 0500  Basic metabolic panel  Tomorrow morning,   R        03/07/21 1426   03/08/21 0500  Magnesium  Tomorrow morning,   R        03/07/21 1426   03/06/21 1052  Stool culture (children & immunocomp patients)  Once,   R       Question:  Patient immune status  Answer:  Immunocompromised   03/06/21 1051            DVT prophylaxis: SCDs Start: 03/05/21 0135   Code Status: Full Family Communication: Patient Disposition:   Status is: Inpatient   Dispo: The patient is from: Home  Anticipated d/c is to: Home              Anticipated d/c date is: awaiting for culture,  need general surgery and urology clearance, need gi consult                Consultants:   General surgery  Urology  GI  Procedures:   I&D left sacrum on 9/25 by urology Dr. Alvester Morin Rectum Exam under anesthesia, rectal biopsy by general surgery Dr. Carolynne Edouard  Antimicrobials:    Anti-infectives (From admission, onward)   Start     Dose/Rate Route Frequency Ordered Stop   03/05/21 1715  clindamycin (CLEOCIN) IVPB 600 mg  Status:  Discontinued        600 mg 100 mL/hr over 30 Minutes Intravenous Every 8 hours 03/05/21 1615 03/07/21 1422   03/05/21 1000  bictegravir-emtricitabine-tenofovir AF (BIKTARVY) 50-200-25 MG per  tablet 1 tablet        1 tablet Oral Daily 03/05/21 0542     03/05/21 0600  piperacillin-tazobactam (ZOSYN) IVPB 3.375 g        3.375 g 12.5 mL/hr over 240 Minutes Intravenous Every 8 hours 03/04/21 2344     03/05/21 0600  vancomycin (VANCOCIN) IVPB 1000 mg/200 mL premix        1,000 mg 200 mL/hr over 60 Minutes Intravenous Every 8 hours 03/04/21 2344     03/04/21 2215  vancomycin (VANCOREADY) IVPB 1500 mg/300 mL        1,500 mg 150 mL/hr over 120 Minutes Intravenous NOW 03/04/21 2206 03/05/21 0037   03/04/21 2215  piperacillin-tazobactam (ZOSYN) IVPB 3.375 g        3.375 g 100 mL/hr over 30 Minutes Intravenous  Once 03/04/21 2206 03/04/21 2301         Objective: Vitals:   03/06/21 0533 03/06/21 0849 03/06/21 1725 03/07/21 0447  BP: 118/71 114/74 (!) 139/92 104/68  Pulse: 64 76 70 62  Resp: Temp: 98.4 F (36.9 C) 98.1 F (36.7 C) 98.5 F (36.9 C) (!) 97.5 F (36.4 C)  TempSrc:  Oral Oral Oral  SpO2: 99% 100% 100% 100%  Weight:      Height:        Intake/Output Summary (Last 24 hours) at 03/07/2021 1426 Last data filed at 03/06/2021 1948 Gross per 24 hour  Intake 1140.27 ml  Output --  Net 1140.27 ml   Filed Weights   03/04/21 2044 03/05/21 1827  Weight: 77.1 kg 77.1 kg    Examination:  General exam: calm, NAD Respiratory system: Clear to auscultation. Respiratory effort normal. Cardiovascular system: S1 & S2 heard, RRR. No JVD, no murmur, No pedal edema. Gastrointestinal system: Abdomen is nondistended, soft and nontender.  Normal bowel sounds heard. Central nervous system: Alert and oriented. No focal neurological deficits. Extremities: Symmetric 5 x 5 power. Skin: surgical wound exam deferred  Psychiatry: Judgement and insight appear normal. Mood & affect appropriate.     Data Reviewed: I have personally reviewed following labs and imaging studies  CBC: Recent Labs  Lab 03/04/21 2150 03/05/21 0559 03/06/21 1157 03/07/21 0549  WBC  10.6* 10.3 10.2 6.0  NEUTROABS 8.1*  --   --  3.8  HGB 10.4* 9.5* 10.1* 9.3*  HCT 31.4* 31.8* 33.2* 31.3*  MCV 80.9 76.3* 74.4* 74.9*  PLT 411* 363 426* 376    Basic Metabolic Panel: Recent Labs  Lab 03/04/21 2150 03/05/21 0559 03/06/21 1157 03/07/21 0549  NA 133* 132* 133* 139  K  3.5 3.7 3.9 3.8  CL 100 101 99 105  CO2 26 25 26 30   GLUCOSE 86 88 110* 89  BUN 6 6 9 10   CREATININE 0.79 0.76 1.02 0.84  CALCIUM 8.8* 8.4* 8.5* 8.9  MG  --  1.7 2.0  --   PHOS  --  4.0  --   --     GFR: Estimated Creatinine Clearance: 144.1 mL/min (by C-G formula based on SCr of 0.84 mg/dL).  Liver Function Tests: Recent Labs  Lab 03/04/21 2150 03/05/21 0559  AST 16 12*  ALT 11 9  ALKPHOS 73 62  BILITOT 0.4 0.4  PROT 9.0* 8.2*  ALBUMIN 2.7* 2.3*    CBG: No results for input(s): GLUCAP in the last 168 hours.   Recent Results (from the past 240 hour(s))  Culture, blood (routine x 2)     Status: None (Preliminary result)   Collection Time: 03/04/21  9:08 PM   Specimen: BLOOD  Result Value Ref Range Status   Specimen Description BLOOD LEFT ANTECUBITAL  Final   Special Requests   Final    BOTTLES DRAWN AEROBIC AND ANAEROBIC Blood Culture adequate volume   Culture   Final    NO GROWTH 3 DAYS Performed at [facility], [address] [place], [address] [place]    Report Status PENDING  Incomplete  Aerobic Culture w Gram Stain (superficial specimen)     Status: None (Preliminary result)   Collection Time: 03/04/21  9:44 PM   Specimen: Abscess; Wound  Result Value Ref Range Status   Specimen Description   Final    ABSCESS Performed at [facility], [address], [place], [address] [place]    Special Requests   Final    RIGHT GROIN Performed at [facility], [address], [place], [address] [place]    Gram Stain   Final    NO WBC SEEN FEW GRAM POSITIVE COCCI FEW GRAM POSITIVE RODS    Culture   Final    RARE STAPHYLOCOCCUS AUREUS SUSCEPTIBILITIES TO FOLLOW ABUNDANT  CORYNEBACTERIUM SPECIES Standardized susceptibility testing for this organism is not available. Performed at [facility], [address], [place], [address] [place]    Report Status PENDING  Incomplete  Culture, blood (routine x 2)     Status: None (Preliminary result)   Collection Time: 03/04/21  9:50 PM   Specimen: BLOOD  Result Value Ref Range Status   Specimen Description BLOOD RIGHT ANTECUBITAL  Final   Special Requests   Final    BOTTLES DRAWN AEROBIC AND ANAEROBIC Blood Culture adequate volume   Culture   Final    NO GROWTH 3 DAYS Performed at [facility], [address] [place], [address] [place]    Report Status PENDING  Incomplete  Resp Panel by RT-PCR (Flu A&B, Covid) Groin, Right     Status: None   Collection Time: 03/04/21 10:01 PM   Specimen: Groin, Right; Nasopharyngeal(NP) swabs in vial transport medium  Result Value Ref Range Status   SARS Coronavirus 2 by RT PCR NEGATIVE NEGATIVE Final    Comment: (NOTE) SARS-CoV-2 target nucleic acids are NOT DETECTED.  The SARS-CoV-2 RNA is generally detectable in upper respiratory specimens during the acute phase of infection. The lowest concentration of SARS-CoV-2 viral copies this assay can detect is 138 copies/mL. A negative result does not preclude SARS-Cov-2 infection and should not be used as the sole basis for treatment or other patient management decisions. A negative result may occur with  improper specimen collection/handling, submission of specimen other than nasopharyngeal swab, presence of viral mutation(s) within the areas targeted by this assay, and inadequate number of viral copies(<138 copies/mL). A negative result must be combined with clinical observations, patient history, and epidemiological information. The expected result is Negative.  Fact Sheet for Patients:  BloggerCourse.com  Fact Sheet for Healthcare Providers:  SeriousBroker.it  This  test is no t yet approved or cleared by the [place] FDA and  has been authorized for detection and/or diagnosis of SARS-CoV-2 by FDA under an Emergency Use Authorization (EUA). This EUA will remain  in effect (meaning this test can be used) for the duration of the COVID-19 declaration under Section 564(b)(1) of the Act, 21 U.S.C.section 360bbb-3(b)(1), unless the authorization is terminated  or revoked sooner.       Influenza A by PCR NEGATIVE NEGATIVE Final   Influenza B by PCR NEGATIVE NEGATIVE Final    Comment: (NOTE) The Xpert Xpress SARS-CoV-2/FLU/RSV plus assay is intended as an aid in the diagnosis of influenza from Nasopharyngeal swab specimens and should not be used as a sole basis for treatment. Nasal washings and aspirates are unacceptable for Xpert Xpress SARS-CoV-2/FLU/RSV testing.  Fact Sheet for Patients: BloggerCourse.com  Fact Sheet for Healthcare Providers: SeriousBroker.it  This test is not yet approved or cleared by the [place] FDA and has been authorized for detection and/or diagnosis of SARS-CoV-2 by FDA under an Emergency Use Authorization (EUA). This EUA will remain in effect (meaning this test can be used) for the duration of the COVID-19 declaration under Section 564(b)(1) of the Act, 21 U.S.C. section 360bbb-3(b)(1), unless the authorization is terminated or revoked.  Performed at [facility], [address], [place], [place] [address]   MRSA PCR Screening     Status: Abnormal   Collection Time: 03/05/21  8:36 AM   Specimen: Nasopharyngeal  Result Value Ref Range Status   MRSA by PCR POSITIVE (A) NEGATIVE Final    Comment:        The GeneXpert MRSA Assay (FDA approved for NASAL specimens only), is one component of a comprehensive MRSA colonization surveillance program. It is not intended to diagnose MRSA infection nor to guide or monitor treatment for MRSA infections. RESULT  CALLED TO, READ BACK BY AND VERIFIED WITH: WATSON,K AT 1405 BY HUFFINES,S ON 03/05/21. Performed at [facility], [address] [place], [place] [address]          Radiology Studies: No results found.      Scheduled Meds: . bictegravir-emtricitabine-tenofovir AF  1 tablet Oral Daily  . Chlorhexidine Gluconate Cloth  6 each Topical Q0600  . docusate sodium  100 mg Oral BID  . feeding supplement  237 mL Oral BID BM  . mupirocin ointment  1 application Nasal BID  . nicotine  21 mg Transdermal Daily   Continuous Infusions: . sodium chloride 75 mL/hr at 03/06/21 1655  . piperacillin-tazobactam (ZOSYN)  IV 3.375 g (03/07/21 0650)  . vancomycin 1,000 mg (03/07/21 1301)     LOS: 2 days   Time spent: Greater than 50% of this time was spent in counseling, explanation of diagnosis, planning of further management, and coordination of care.   Voice Recognition Reubin Milan dictation system was used to create this note, attempts have been made to correct errors. Please contact the author with questions and/or clarifications.   Albertine Grates, MD PhD FACP [facility]  Available via Epic secure chat 7am-7pm for nonurgent issues Please page for urgent issues  To page the attending provider between 7A-7P or the covering provider during after hours 7P-7A, please log into the web site [email or web address] and access using universal [place] password for that web site. If you do not have the password, please call the hospital operator.    03/07/2021, 2:26 PM

## 2021-03-07 NOTE — Consult Note (Signed)
Referring Provider: Dr. Albertine Grates Primary Care Physician:  Patient, No Pcp Per (Inactive) Primary Gastroenterologist:  Gentry Fitz  Reason for Consultation:  Proctitis  HPI: Julian Sanchez is a 25 y.o. male with past medical history of HIV currently hospitalized for Fournier gangrene s/p I&D 5/25 presenting for consultation of proctitis.  Patient presented to the ED on 5/24 with groin abscess.  He underwent I&D on 5/25.  He had rectal exam and biopsy on 525.  Note states "friable with multiple small indurated areas.  There did not appear to be a communication from the open wound in the left scrotum and thigh to the deep rectum."  Pathology is pending.   He reports scant bright red blood on the tissue after bowel movement yesterday but otherwise denies seeing pus or blood per rectum.  Denies abdominal pain, diarrhea, nausea, vomiting, changes in appetite, or unexplained weight loss.  No known family history of colon cancer or gastrointestinal malignancy.   Past Medical History:  Diagnosis Date  . ADHD (attention deficit hyperactivity disorder)   . HIV disease (HCC)   . Hx of seasonal allergies   . No pertinent past medical history   . Recurrent tonsillitis   . Wisdom teeth extracted     Past Surgical History:  Procedure Laterality Date  . IRRIGATION AND DEBRIDEMENT ABSCESS  09/28/2012   Procedure: IRRIGATION AND DEBRIDEMENT ABSCESS;  Surgeon: Flo Shanks, MD;  Location: [facility] OR;  Service: ENT;  Laterality: Right;  Right Peritonsillar Abscess  . IRRIGATION AND DEBRIDEMENT ABSCESS N/A 03/05/2021   Procedure: IRRIGATION AND DEBRIDEMENT ABSCESS;  Surgeon: Crista Elliot, MD;  Location: [facility] ORS;  Service: Urology;  Laterality: N/A;  . MOUTH SURGERY    . SCROTAL EXPLORATION N/A 03/05/2021   Procedure: SCROTUM EXPLORATION;  Surgeon: Crista Elliot, MD;  Location: [facility] ORS;  Service: Urology;  Laterality: N/A;  . TONSILLECTOMY    . TONSILLECTOMY N/A 07/23/2017   Procedure: TONSILLECTOMY;   Surgeon: Christia Reading, MD;  Location: [facility] OR;  Service: ENT;  Laterality: N/A;  . WISDOM TOOTH EXTRACTION      Prior to Admission medications   Medication Sig Start Date End Date Taking? Authorizing Provider  bictegravir-emtricitabine-tenofovir AF (BIKTARVY) 50-200-25 MG TABS tablet Take 1 tablet by mouth daily. 03/22/19  Yes Cliffton Asters, MD    Scheduled Meds: . bictegravir-emtricitabine-tenofovir AF  1 tablet Oral Daily  . Chlorhexidine Gluconate Cloth  6 each Topical Q0600  . docusate sodium  100 mg Oral BID  . feeding supplement  237 mL Oral BID BM  . mupirocin ointment  1 application Nasal BID  . nicotine  21 mg Transdermal Daily   Continuous Infusions: . sodium chloride 75 mL/hr at 03/06/21 1655  . piperacillin-tazobactam (ZOSYN)  IV 3.375 g (03/07/21 1436)  . vancomycin 1,000 mg (03/07/21 1301)   PRN Meds:.acetaminophen, morphine injection, ondansetron (ZOFRAN) IV, polyethylene glycol  Allergies as of 03/04/2021  . (No Known Allergies)    Family History  Problem Relation Age of Onset  . Diabetes Mother   . Asthma Father     Social History   Socioeconomic History  . Marital status: Single    Spouse name: Not on file  . Number of children: Not on file  . Years of education: Not on file  . Highest education level: Not on file  Occupational History  . Not on file  Tobacco Use  . Smoking status: Light Tobacco Smoker    Packs/day: 0.40    Types:  Cigarettes  . Smokeless tobacco: Never Used  Vaping Use  . Vaping Use: Never used  Substance and Sexual Activity  . Alcohol use: No    Alcohol/week: 0.0 standard drinks  . Drug use: Yes    Frequency: 7.0 times per week    Types: Marijuana    Comment: today  . Sexual activity: Yes    Partners: Male, Male    Birth control/protection: Condom    Comment: delcined condoms  Other Topics Concern  . Not on file  Social History Narrative  . Not on file   Social Determinants of Health   Financial Resource  Strain: Not on file  Food Insecurity: Not on file  Transportation Needs: Not on file  Physical Activity: Not on file  Stress: Not on file  Social Connections: Not on file  Intimate Partner Violence: Not on file    Review of Systems: Review of Systems  Constitutional: Negative for chills and fever.  HENT: Negative for hearing loss and tinnitus.   Eyes: Negative for pain and redness.  Respiratory: Negative for cough and shortness of breath.   Cardiovascular: Negative for chest pain and palpitations.  Gastrointestinal: Positive for blood in stool. Negative for abdominal pain, constipation, diarrhea, heartburn, melena, nausea and vomiting.  Genitourinary: Negative for flank pain and hematuria.  Musculoskeletal: Negative for falls and joint pain.  Skin: Negative for itching and rash.  Neurological: Negative for seizures and loss of consciousness.  Endo/Heme/Allergies: Negative for polydipsia. Does not bruise/bleed easily.  Psychiatric/Behavioral: Negative for substance abuse. The patient is not nervous/anxious.     Physical Exam: Vital signs: Vitals:   03/06/21 1725 03/07/21 0447  BP: (!) 139/92 104/68  Pulse: 70 62  Resp: 16 16  Temp: 98.5 F (36.9 C) (!) 97.5 F (36.4 C)  SpO2: 100% 100%   Last BM Date: 03/05/21 Physical Exam Vitals reviewed.  Constitutional:      General: He is not in acute distress. HENT:     Head: Normocephalic and atraumatic.     Nose: Nose normal. No congestion.     Mouth/Throat:     Mouth: Mucous membranes are moist.     Pharynx: Oropharynx is clear.  Eyes:     Extraocular Movements: Extraocular movements intact.     Conjunctiva/sclera: Conjunctivae normal.  Cardiovascular:     Rate and Rhythm: Normal rate and regular rhythm.  Pulmonary:     Effort: Pulmonary effort is normal. No respiratory distress.  Abdominal:     General: Abdomen is flat. There is no distension.     Palpations: Abdomen is soft. There is no mass.     Tenderness: There is  no abdominal tenderness. There is no guarding or rebound.     Hernia: No hernia is present.  Musculoskeletal:     Cervical back: Normal range of motion and neck supple.     Right lower leg: No edema.     Left lower leg: No edema.  Skin:    General: Skin is warm and dry.  Neurological:     General: No focal deficit present.     Mental Status: He is alert and oriented to person, place, and time.  Psychiatric:        Mood and Affect: Mood normal.        Behavior: Behavior normal. Behavior is cooperative.     GI:  Lab Results: Recent Labs    03/05/21 0559 03/06/21 1157 03/07/21 0549  WBC 10.3 10.2 6.0  HGB 9.5* 10.1* 9.3*  HCT 31.8* 33.2* 31.3*  PLT 363 426* 376   BMET Recent Labs    03/05/21 0559 03/06/21 1157 03/07/21 0549  NA 132* 133* 139  K 3.7 3.9 3.8  CL 101 99 105  CO2 25 26 30   GLUCOSE 88 110* 89  BUN 6 9 10   CREATININE 0.76 1.02 0.84  CALCIUM 8.4* 8.5* 8.9   LFT Recent Labs    03/05/21 0559  PROT 8.2*  ALBUMIN 2.3*  AST 12*  ALT 9  ALKPHOS 62  BILITOT 0.4   PT/INR Recent Labs    03/05/21 0559  LABPROT 15.2  INR 1.2     Studies/Results: No results found.  Impression: Proctitis: Suspect infectious etiology.  Rectal biopsy at time of I&D is pending. Stool cultures pending.  Fournier's gangrene s/p I&D 5/25  HIV with immunosuppression  Plan: Proceed with unprepped flexible sigmoidoscopy tomorrow for further evaluation.  Patient prefers to be sedated for the procedure.  I thoroughly discussed the procedure with the patient to include nature, alternatives, benefits, and risks (including but not limited to bleeding, infection, perforation, anesthesia/cardiac and pulmonary complications).  Patient verbalized understanding and gave verbal consent to proceed with flexible sigmoidoscopy.  NPO at midnight.  [facility] will follow.   LOS: 2 days   03/07/21  PA-C 03/07/2021, 3:19 PM  Contact #  [phone]

## 2021-03-07 NOTE — Progress Notes (Signed)
Pharmacy Antibiotic Note  Julian Sanchez is a 25 y.o. male admitted on 03/04/2021 with groin abscess, currently on broad spectrum antibiotics for Fournier's gangrene.  Pharmacy has been consulted for Vancomycin and Zosyn dosing.  Today, 03/07/21  WBC WNL  SCR WNL and stable  Afebrile  Plan:  Continue piperacillin/tazobactam 3.375 g IV q8h EI  Continue vancomycin 1000 mg IV q8h for goal vancomycin AUC 400-550 and/or VT 15-20 mcg/mL  Clindamycin per MD  Follow renal function, culture data  Check VT on 5/28   Height: 6' (182.9 cm) Weight: 77.1 kg (169 lb 15.6 oz) IBW/kg (Calculated) : 77.6  Temp (24hrs), Avg:98 F (36.7 C), Min:97.5 F (36.4 C), Max:98.5 F (36.9 C)  Recent Labs  Lab 03/04/21 2150 03/05/21 0559 03/06/21 1157 03/07/21 0549  WBC 10.6* 10.3 10.2 6.0  CREATININE 0.79 0.76 1.02 0.84  LATICACIDVEN 1.1  --   --   --     Estimated Creatinine Clearance: 144.1 mL/min (by C-G formula based on SCr of 0.84 mg/dL).    No Known Allergies  Antimicrobials this admission: 5/24 Vanc >>  5/24 Zosyn >>  5/25 clindamycin >>  Microbiology results: 5/24 BCx: ngtd 5/24 R groin wound: rare staph aureus (S pending); abundant corynebacterium species  Thank you for allowing pharmacy to be a part of this patient's care.  Cindi Carbon, PharmD 03/07/21 2:17 PM

## 2021-03-07 NOTE — Progress Notes (Signed)
[facility] Progress Note  2 Days Post-Op  Subjective: CC-  Sitting up in bed eating breakfast. Overall doing well. States that he was able to change the packing himself last night. Still has discomfort with dressing changes, but this is improved. Tolerating diet. BM yesterday, stool sent for culture. WBC WNL, afebrile  Objective: Vital signs in last 24 hours: Temp:  [97.5 F (36.4 C)-98.5 F (36.9 C)] 97.5 F (36.4 C) (05/27 0447) Pulse Rate:  [62-70] 62 (05/27 0447) Resp:  [16] 16 (05/27 0447) BP: (104-139)/(68-92) 104/68 (05/27 0447) SpO2:  [100 %] 100 % (05/27 0447) Last BM Date: 03/05/21  Intake/Output from previous day: 05/26 0701 - 05/27 0700 In: 1380.3 [P.O.:830; I.V.:0.3; IV Piggyback:550] Out: -  Intake/Output this shift: No intake/output data recorded.  PE: Gen:  Alert, NAD, pleasant Pulm:  rate and effort normal Abd: Soft, NT/ND GU: cdi dressing to left groin; no areas of fluctuance, drainage, or erythema noted to perirectal area  Lab Results:  Recent Labs    03/06/21 1157 03/07/21 0549  WBC 10.2 6.0  HGB 10.1* 9.3*  HCT 33.2* 31.3*  PLT 426* 376   BMET Recent Labs    03/06/21 1157 03/07/21 0549  NA 133* 139  K 3.9 3.8  CL 99 105  CO2 26 30  GLUCOSE 110* 89  BUN 9 10  CREATININE 1.02 0.84  CALCIUM 8.5* 8.9   PT/INR Recent Labs    03/05/21 0559  LABPROT 15.2  INR 1.2   CMP     Component Value Date/Time   NA 139 03/07/2021 0549   K 3.8 03/07/2021 0549   CL 105 03/07/2021 0549   CO2 30 03/07/2021 0549   GLUCOSE 89 03/07/2021 0549   BUN 10 03/07/2021 0549   CREATININE 0.84 03/07/2021 0549   CREATININE 0.99 02/28/2019 1415   CALCIUM 8.9 03/07/2021 0549   PROT 8.2 (H) 03/05/2021 0559   ALBUMIN 2.3 (L) 03/05/2021 0559   AST 12 (L) 03/05/2021 0559   ALT 9 03/05/2021 0559   ALKPHOS 62 03/05/2021 0559   BILITOT 0.4 03/05/2021 0559   GFRNONAA >60 03/07/2021 0549   GFRNONAA 105 02/28/2019 1415   GFRAA >60 03/05/2020  0955   GFRAA 122 02/28/2019 1415   Lipase     Component Value Date/Time   LIPASE 23 09/28/2015 1413       Studies/Results: US SCROTUM W/DOPPLER  Addendum Date: 03/05/2021   ADDENDUM REPORT: 03/05/2021 13:15 ADDENDUM: Discussed with Dr. Henreitta Leber on 03/05/2021 at 1315 hours. Electronically Signed   By: Ulyses Southward M.D.   On: 03/05/2021 13:15   Result Date: 03/05/2021 CLINICAL DATA:  LEFT scrotal abscess, symptoms for 3 months EXAM: SCROTAL ULTRASOUND DOPPLER ULTRASOUND OF THE TESTICLES TECHNIQUE: Complete ultrasound examination of the testicles, epididymis, and other scrotal structures was performed. Color and spectral Doppler ultrasound were also utilized to evaluate blood flow to the testicles. COMPARISON:  CT pelvis 03/04/2021 FINDINGS: Right testicle Measurements: 3.7 x 1.9 x 2.5 cm. Normal echogenicity without mass or calcification. Internal blood flow present on color Doppler imaging. Left testicle Measurements: 3.41.5 x 2.5 cm. Normal echogenicity without mass or calcification. Internal blood flow present on color Doppler imaging. Right epididymis:  Normal in size and appearance. Left epididymis: Normal in size and appearance. Slightly hypervascular. Hydrocele:  Trace RIGHT hydrocele fluid Varicocele:  None visualized. Pulsed Doppler interrogation of both testes demonstrates normal low resistance arterial and venous waveforms bilaterally. Extensive soft tissue swelling of LEFT hemiscrotum extending into LEFT groin and  LEFT perineum. Multiple loculated fluid collections are seen consistent with abscesses. A more anterior superior abscess measures 1.6 cm. A second collection extending posteriorly towards the perineum and anus measures 10 mm diameter and 3.3 cm length. A third more posterior collection measures 1.3 cm diameter. A few scattered foci of bright punctate hyperechogenicity are seen, particularly within the small abscess collections, without definite shadowing; these are concerning for  developing foci of soft tissue gas/pneumatosis. Reactive LEFT inguinal lymph nodes identified IMPRESSION: Normal appearing testes and epididymi. Significant soft tissue swelling LEFT hemiscrotum extending into LEFT groin and posteriorly into perineum to perianal region. Focal complex collections are seen within the area of soft tissue edema consistent with abscesses, with 3 discrete collections measuring 1.6 cm, 3.3 cm, and 1.3 cm in greatest sizes. Both of these collections contain foci of punctate hyperechogenicity concerning for gas. 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 Critical Value/emergent results were called by telephone at the time of interpretation on 03/05/2021 at 12:33 pm to provider [facility] , who verbally acknowledged these results. Electronically Signed: By: Ulyses Southward M.D. On: 03/05/2021 12:36    Anti-infectives: Anti-infectives (From admission, onward)   Start     Dose/Rate Route Frequency Ordered Stop   03/05/21 1715  clindamycin (CLEOCIN) IVPB 600 mg        600 mg 100 mL/hr over 30 Minutes Intravenous Every 8 hours 03/05/21 1615     03/05/21 1000  bictegravir-emtricitabine-tenofovir AF (BIKTARVY) 50-200-25 MG per tablet 1 tablet        1 tablet Oral Daily 03/05/21 0542     03/05/21 0600  piperacillin-tazobactam (ZOSYN) IVPB 3.375 g        3.375 g 12.5 mL/hr over 240 Minutes Intravenous Every 8 hours 03/04/21 2344     03/05/21 0600  vancomycin (VANCOCIN) IVPB 1000 mg/200 mL premix        1,000 mg 200 mL/hr over 60 Minutes Intravenous Every 8 hours 03/04/21 2344     03/04/21 2215  vancomycin (VANCOREADY) IVPB 1500 mg/300 mL        1,500 mg 150 mL/hr over 120 Minutes Intravenous NOW 03/04/21 2206 03/05/21 0037   03/04/21 2215  piperacillin-tazobactam (ZOSYN) IVPB 3.375 g        3.375 g 100 mL/hr over 30 Minutes Intravenous  Once 03/04/21 2206 03/04/21 2301       Assessment/Plan HIV - followed by Dr. Orvan Falconer, admits that he is not totally  compliant with Biktarvy, last CD4 count 325 (03/05/20). He called yesterday and scheduled an appointment for 2 weeks out Tobacco abuse  FOURNIER'S GANGRENE SCROTUM/THIGH -S/p Incision and drainage of scrotum and groin and left thigh with excisional debridement of scrotum 1 cm x 1 cm 5/25 Dr. Alvester Morin - POD#2 - culture sent in ED 5/24, gram stain with FEW GRAM POSITIVE COCCI and GRAM POSITIVE RODS, report pending - continue broad spectrum antibiotics, narrow as able per cultures - wound care per urology  -S/p Exam under anesthesia, rectal biopsy 5/25 Dr. Carolynne Edouard - Intraop consult called for pus from the rectum. Intraoperatively: on DRE "friable with multiple small indurated areas. There did not appear to be a communication from the open wound in the left scrotum and thigh to the deep rectum" - path pending - stool cultures pending - Seems to be improving, low suspicion for anal fistula at this time and would not recommend repeat EUA. Could consider GI evaluation for proctitis, and if after discharge there is concern for any rectal involvement could consider  MRI to evaluate for fistula.   ID - zosyn 5/24>>, clindamycin 5/25>>, vancomycin 5/24>> FEN - reg diet VTE - ok for chemical DVT prophylaxis from surgical standpoint Foley - none    LOS: 2 days    Franne Forts, [facility] 03/07/2021, 9:12 AM Please see Amion for pager number during day hours 7:00am-4:30pm

## 2021-03-07 NOTE — TOC Transition Note (Signed)
Transition of Care [facility]) - CM/SW Discharge Note   Patient Details  Name: Julian Sanchez MRN: [id number] Date of Birth: 1996-01-29  [facility] CM/SW Contact:  Ida Rogue, LCSW Phone Number: 03/07/2021, 11:19 AM   Clinical Narrative:  Patient who will need wound care oversight does not have insurance nor a PCP.  Lives in [place].  Notes state he is being trained in dressing changes. Had him talk with Diane at [facility] in [place], a [facility] clinic that is set up for folks without insurance.  Because he works full time and makes more than $12.00 an hour, he does not meet criteria to be seen by then.  However, she stated the [facility] in [place] is taking new patient referrals, and they will see him.  He confirmed that he has transportation and can get to [facility] for appointments.  Unable to make an appointment for him as the clinic is closed today for the holiday weekend.  Information entered in AVS with directions for him to follow up with making appointment on Tuesday.  TOC will continue to follow during the course of hospitalization.     Final next level of care: Home/Self Care Barriers to Discharge: Barriers Resolved   Patient Goals and CMS Choice        Discharge Placement                       Discharge Plan and Services                                     Social Determinants of Health (SDOH) Interventions     Readmission Risk Interventions No flowsheet data found.

## 2021-03-08 ENCOUNTER — Encounter (HOSPITAL_COMMUNITY): Payer: Self-pay | Admitting: Internal Medicine

## 2021-03-08 ENCOUNTER — Inpatient Hospital Stay (HOSPITAL_COMMUNITY): Payer: Self-pay | Admitting: Certified Registered Nurse Anesthetist

## 2021-03-08 ENCOUNTER — Encounter (HOSPITAL_COMMUNITY)
Admission: EM | Disposition: A | Discharge status: Home / Self Care | Payer: Self-pay | Source: Home / Self Care | Attending: Internal Medicine

## 2021-03-08 HISTORY — PX: BIOPSY: SHX5522

## 2021-03-08 HISTORY — PX: FLEXIBLE SIGMOIDOSCOPY: SHX5431

## 2021-03-08 LAB — CBC WITH DIFFERENTIAL/PLATELET
Abs Immature Granulocytes: 0.04 10*3/uL (ref 0.00–0.07)
Basophils Absolute: 0 10*3/uL (ref 0.0–0.1)
Basophils Relative: 0 %
Eosinophils Absolute: 0.1 10*3/uL (ref 0.0–0.5)
Eosinophils Relative: 1 %
HCT: 31.7 % — ABNORMAL LOW (ref 39.0–52.0)
Hemoglobin: 9.4 g/dL — ABNORMAL LOW (ref 13.0–17.0)
Immature Granulocytes: 1 %
Lymphocytes Relative: 27 %
Lymphs Abs: 1.9 10*3/uL (ref 0.7–4.0)
MCH: 22.4 pg — ABNORMAL LOW (ref 26.0–34.0)
MCHC: 29.7 g/dL — ABNORMAL LOW (ref 30.0–36.0)
MCV: 75.7 fL — ABNORMAL LOW (ref 80.0–100.0)
Monocytes Absolute: 0.7 10*3/uL (ref 0.1–1.0)
Monocytes Relative: 10 %
Neutro Abs: 4.3 10*3/uL (ref 1.7–7.7)
Neutrophils Relative %: 61 %
Platelets: 406 10*3/uL — ABNORMAL HIGH (ref 150–400)
RBC: 4.19 MIL/uL — ABNORMAL LOW (ref 4.22–5.81)
RDW: 14.6 % (ref 11.5–15.5)
WBC: 7.1 10*3/uL (ref 4.0–10.5)
nRBC: 0 % (ref 0.0–0.2)

## 2021-03-08 LAB — IRON AND TIBC
Iron: 18 ug/dL — ABNORMAL LOW (ref 45–182)
Saturation Ratios: 9 % — ABNORMAL LOW (ref 17.9–39.5)
TIBC: 203 ug/dL — ABNORMAL LOW (ref 250–450)
UIBC: 185 ug/dL

## 2021-03-08 LAB — BASIC METABOLIC PANEL
Anion gap: 6 (ref 5–15)
BUN: 6 mg/dL (ref 6–20)
CO2: 30 mmol/L (ref 22–32)
Calcium: 8.9 mg/dL (ref 8.9–10.3)
Chloride: 103 mmol/L (ref 98–111)
Creatinine, Ser: 1.19 mg/dL (ref 0.61–1.24)
GFR, Estimated: >60 mL/min (ref >60)
Glucose, Bld: 75 mg/dL (ref 70–99)
Potassium: 4 mmol/L (ref 3.5–5.1)
Sodium: 139 mmol/L (ref 135–145)

## 2021-03-08 LAB — VANCOMYCIN, TROUGH: Vancomycin Tr: 28 ug/mL (ref 15–20)

## 2021-03-08 LAB — MAGNESIUM: Magnesium: 2.1 mg/dL (ref 1.7–2.4)

## 2021-03-08 SURGERY — SIGMOIDOSCOPY, FLEXIBLE
Anesthesia: Monitor Anesthesia Care

## 2021-03-08 MED ORDER — MIDAZOLAM HCL 2 MG/2ML IJ SOLN
INTRAMUSCULAR | Status: DC | PRN
  Administered 2021-03-08: 2 mg via INTRAVENOUS

## 2021-03-08 MED ORDER — SODIUM CHLORIDE 0.9 % IV SOLN: INTRAVENOUS | Status: DC

## 2021-03-08 MED ORDER — PROPOFOL 1000 MG/100ML IV EMUL
INTRAVENOUS | Status: AC
  Filled 2021-03-08: qty 200

## 2021-03-08 MED ORDER — VANCOMYCIN HCL IN DEXTROSE 1-5 GM/200ML-% IV SOLN
1000.0000 mg | Freq: Two times a day (BID) | INTRAVENOUS | Status: DC
  Administered 2021-03-08: 1000 mg via INTRAVENOUS
  Filled 2021-03-08 (×2): qty 200

## 2021-03-08 MED ORDER — PROPOFOL 500 MG/50ML IV EMUL
INTRAVENOUS | Status: DC | PRN
  Administered 2021-03-08: 150 ug/kg/min via INTRAVENOUS

## 2021-03-08 MED ORDER — PROPOFOL 10 MG/ML IV BOLUS
INTRAVENOUS | Status: DC | PRN
  Administered 2021-03-08 (×2): 20 mg via INTRAVENOUS
  Administered 2021-03-08 (×2): 40 mg via INTRAVENOUS
  Administered 2021-03-08: 20 mg via INTRAVENOUS

## 2021-03-08 MED ORDER — MIDAZOLAM HCL 2 MG/2ML IJ SOLN
INTRAMUSCULAR | Status: AC
  Filled 2021-03-08: qty 2

## 2021-03-08 MED ORDER — LIDOCAINE HCL (CARDIAC) PF 100 MG/5ML IV SOSY
PREFILLED_SYRINGE | INTRAVENOUS | Status: DC | PRN
  Administered 2021-03-08: 100 mg via INTRAVENOUS

## 2021-03-08 MED ORDER — LACTATED RINGERS IV SOLN: INTRAVENOUS | Status: DC

## 2021-03-08 NOTE — Interval H&P Note (Signed)
History and Physical Interval Note:  03/08/2021 9:42 AM  Julian Sanchez  has presented today for surgery, with the diagnosis of proctitis.  The various methods of treatment have been discussed with the patient. After consideration of risks, benefits and other options for treatment, the patient has consented to  Procedure(s): FLEXIBLE SIGMOIDOSCOPY (N/A) BIOPSY as a surgical intervention.  The patient's history has been reviewed, patient examined, no change in status, stable for surgery.  I have reviewed the patient's chart and labs.  Questions were answered to the patient's satisfaction.     Katy Fitch Gill Delrossi

## 2021-03-08 NOTE — Anesthesia Preprocedure Evaluation (Signed)
Anesthesia Evaluation  Patient identified by MRN, date of birth, ID band Patient awake    Airway Mallampati: II  TM Distance: >3 FB Neck ROM: Full    Dental  (+) Chipped, Poor Dentition   Pulmonary neg pulmonary ROS, Current Smoker and Patient abstained from smoking.,    Pulmonary exam normal        Cardiovascular negative cardio ROS   Rhythm:Regular Rate:Normal     Neuro/Psych Depression negative neurological ROS     GI/Hepatic Neg liver ROS, Proctitis    Endo/Other  negative endocrine ROS  Renal/GU negative Renal ROS  negative genitourinary   Musculoskeletal negative musculoskeletal ROS (+)   Abdominal (+)  Abdomen: soft. Bowel sounds: normal.  Peds  (+) ADHD Hematology  (+) HIV,   Anesthesia Other Findings   Reproductive/Obstetrics                             Anesthesia Physical Anesthesia Plan  ASA: III  Anesthesia Plan: MAC   Post-op Pain Management:    Induction: Intravenous  PONV Risk Score and Plan: Propofol infusion  Airway Management Planned: Simple Face Mask, Natural Airway and Nasal Cannula  Additional Equipment: None  Intra-op Plan:   Post-operative Plan:   Informed Consent: I have reviewed the patients History and Physical, chart, labs and discussed the procedure including the risks, benefits and alternatives for the proposed anesthesia with the patient or authorized representative who has indicated his/her understanding and acceptance.     Dental advisory given  Plan Discussed with: CRNA  Anesthesia Plan Comments:         Anesthesia Quick Evaluation

## 2021-03-08 NOTE — Op Note (Signed)
[facility] Patient Name: Julian Sanchez Procedure Date: 03/08/2021 MRN: [id number] Attending MD: Bernette Redbird , MD Date of Birth: [birth year] CSN: [id number] Age: 25 Admit Type: Inpatient Procedure:                Flexible Sigmoidoscopy Indications:              Abnormal CT of the GI tract Providers:                Bernette Redbird, MD, Margaree Mackintosh, RN,                            Leanne Lovely, Technician, Crista Elliot Referring MD:              Medicines:                Monitored Anesthesia Care Complications:            No immediate complications. Estimated Blood Loss:     Estimated blood loss was minimal. Procedure:                Pre-Anesthesia Assessment:                           - Prior to the procedure, a History and Physical                            was performed, and patient medications and                            allergies were reviewed. The patient's tolerance of                            previous anesthesia was also reviewed. The risks                            and benefits of the procedure and the sedation                            options and risks were discussed with the patient.                            All questions were answered, and informed consent                            was obtained. Prior Anticoagulants: The patient has                            taken no previous anticoagulant or antiplatelet                            agents. ASA Grade Assessment: III - A patient with                            severe systemic disease. After reviewing the risks  and benefits, the patient was deemed in                            satisfactory condition to undergo the procedure.                           After obtaining informed consent, the scope was                            passed under direct vision. The PCF-H190DL                            ([id number] Olympus pediatric colonscope was                             introduced through the anus and advanced to the 30                            cm from the anal verge. The flexible sigmoidoscopy                            was accomplished without difficulty. The patient                            tolerated the procedure well. The quality of the                            bowel preparation (unprepped) was good. Scope In: Scope Out: Findings:      The perianal examination was normal.      The digital rectal exam findings include nodularity in distal rectum,       with some narrowing; unable to feel prostate.      Significantly nodular, erythematous mucosa with some deformity was found       in the distal rectum. There was a large amount of mucoid exudate and       slight friability. No discrete mass present. Biopsies were taken with a       cold forceps for histology.      The mucosa of the mid and proximal rectum and the distal sigmoid was       edematous, with a boggy character and loss of vascularity but no       erythema or overt inflammatory changes. This area was also biopsied.      The mucosa of the mid-sigmoid appeared normal, and a small amount of       formed brown stool was present. Impression:               - Nonspecific benign-appearing distal rectal                            inflammation, characterized by nodularity erythema,                            mucoid exudate, and some friability, with some  narrowing; unable to feel prostate found on digital                            rectal exam. Moderate Sedation:      This patient was sedated with monitored anesthesia care, not moderate       sedation. Recommendation:           - Await pathology results. Procedure Code(s):        --- Professional ---                           [phone], Sigmoidoscopy, flexible; with biopsy, single                            or multiple Diagnosis Code(s):        --- Professional ---                           K62.89, Other specified  diseases of anus and rectum                           R93.3, Abnormal findings on diagnostic imaging of                            other parts of digestive tract CPT copyright 2019 American Medical Association. All rights reserved. The codes documented in this report are preliminary and upon coder review may  be revised to meet current compliance requirements. Bernette Redbird, MD 03/08/2021 10:01:21 AM This report has been signed electronically. Number of Addenda: 0

## 2021-03-08 NOTE — Transfer of Care (Signed)
Immediate Anesthesia Transfer of Care Note  Patient: Julian Sanchez  Procedure(s) Performed: FLEXIBLE SIGMOIDOSCOPY (N/A ) BIOPSY  Patient Location: Endoscopy Unit  Anesthesia Type:MAC  Level of Consciousness: drowsy and patient cooperative  Airway & Oxygen Therapy: Patient Spontanous Breathing and Patient connected to face mask oxygen  Post-op Assessment: Report given to RN and Post -op Vital signs reviewed and stable  Post vital signs: Reviewed and stable  Last Vitals:  Vitals Value Taken Time  BP 102/58 0927  Temp    Pulse 69 03/08/21 0929  Resp 18 03/08/21 0929  SpO2 100 % 03/08/21 0929  Vitals shown include unvalidated device data.  Last Pain:  Vitals:   03/08/21 0843  TempSrc: Oral  PainSc: 0-No pain      Patients Stated Pain Goal: 2 (35/36/14 4315)  Complications: No complications documented.

## 2021-03-08 NOTE — Anesthesia Postprocedure Evaluation (Signed)
Anesthesia Post Note  Patient: Julian Sanchez  Procedure(s) Performed: FLEXIBLE SIGMOIDOSCOPY (N/A ) BIOPSY     Patient location during evaluation: Endoscopy Anesthesia Type: MAC Level of consciousness: awake and alert Pain management: pain level controlled Vital Signs Assessment: post-procedure vital signs reviewed and stable Respiratory status: spontaneous breathing, nonlabored ventilation, respiratory function stable and patient connected to nasal cannula oxygen Cardiovascular status: stable and blood pressure returned to baseline Postop Assessment: no apparent nausea or vomiting Anesthetic complications: no   No complications documented.  Last Vitals:  Vitals:   03/08/21 0950 03/08/21 1421  BP: 106/69 117/78  Pulse: (!) 57 63  Resp: 16 16  Temp:  37.1 C  SpO2: 100% 100%    Last Pain:  Vitals:   03/08/21 0950  TempSrc:   PainSc: Asleep                 March Rummage Ismar Yabut

## 2021-03-08 NOTE — Progress Notes (Signed)
Date and time results received: 03/08/21 1030 Test: Vanc trough  Critical Value:28  Name of Provider Notified: [facility] Vancomycin dose adjusted

## 2021-03-08 NOTE — Progress Notes (Signed)
Pharmacy Antibiotic Note  Julian Sanchez is a 25 y.o. male admitted on 03/04/2021 with groin abscess, currently on broad spectrum antibiotics for Fournier's gangrene.  Pharmacy has been consulted for Vancomycin and Zosyn dosing.  Today, 03/08/21  D4 full abx  WBC stable WNL  Remains afebrile since 5/25  SCr up ~30% from baseline today  VT supratherapeutic at 28 mcg/mL; drawn appropriately  Plan:  Reduce vancomycin to 1g IV q12 hr  Continue piperacillin/tazobactam 3.375 g IV q8h EI  Clindamycin stopped per MD  Daily SCr ordered with elevated trough and rising SCr  Follow staph sensitivities  Height: 6' (182.9 cm) Weight: 77.1 kg (169 lb 15.6 oz) IBW/kg (Calculated) : 77.6  Temp (24hrs), Avg:98.4 F (36.9 C), Min:98 F (36.7 C), Max:98.6 F (37 C)  Recent Labs  Lab 03/04/21 2150 03/05/21 0559 03/06/21 1157 03/07/21 0549 03/08/21 0516 03/08/21 1020  WBC 10.6* 10.3 10.2 6.0 7.1  --   CREATININE 0.79 0.76 1.02 0.84 1.19  --   LATICACIDVEN 1.1  --   --   --   --   --   VANCOTROUGH  --   --   --   --   --  28*    Estimated Creatinine Clearance: 101.7 mL/min (by C-G formula based on SCr of 1.19 mg/dL).    No Known Allergies  Antimicrobials this admission: 5/24 Vanc >>  5/24 Zosyn >>  5/25 clindamycin >> 5/27  Microbiology results: 5/24 BCx: ngtd 5/24 R groin wound: rare staph aureus (S pending); abundant corynebacterium species  Thank you for allowing pharmacy to be a part of this patient's care.  Shanen Norris A, PharmD 03/08/21 11:08 AM

## 2021-03-08 NOTE — Progress Notes (Signed)
Arrived in endo for PIV. Primary RN stated the pt had access and IVT not needed at this time.

## 2021-03-08 NOTE — Progress Notes (Signed)
Patient's sigmoidoscopy was well-tolerated, and was pertinent for significant distal rectal nodularity and erythema of unclear cause.  The proximal rectum and distal sigmoid mucosa were somewhat edematous and boggy but not frankly inflamed.  The mid sigmoid mucosa looked normal.  Biopsies have been obtained from each of these areas.  The observed findings are not characteristic of any specific GI process that I am familiar with, such as Crohn's disease or ulcerative colitis.  Recommendation:  1.  Okay for discharge from GI tract standpoint.  None of the findings observed on this patient's exam mandates continued inpatient stay.  Patient can be discharged when okay with urology.  2.  I will follow-up with the patient concerning his biopsy results.  Depending on what they show, referral to a colorectal surgeon or other specialist may or may not be needed.  Of note, the patient is not really symptomatic with respect to his rectum.  3.  I do not think any specific medical treatment or new medications are needed at the present time with respect to the abnormal findings in the rectum.  I will sign off.  Please call me if you have questions pertaining to this patient's care.  Florencia Reasons, M.D. Pager [phone] If no answer or after 5 PM call [phone]

## 2021-03-08 NOTE — Progress Notes (Addendum)
PROGRESS NOTE    Julian Sanchez  DXI:[id number] DOB: 14-Jan-1996 DOA: 03/04/2021 PCP: Patient, No Pcp Per (Inactive)    Chief Complaint  Patient presents with  . Abscess    Brief Narrative:  Julian Sanchez is a 25 y.o. male with medical history significant for HIV who presents to the emergency department due to 3 to 4 days of recurrent groin abscess.  He complained that the area has been draining and painful on ambulation unless he widens his gait.  Patient states that he has had recurrent episodes of similar symptoms, several of which usually self resolve CT pelvis with contrast showed proctitis and left scrotal abscess Is transferred from [facility] to [place] for urology consult  Urology consulted for Fournier gangrene, left scrotum/ thigh, s/p I/d of scrotums /groin on 5/25 Intraoperative consult for general surgery due to pus from the rectum, did not appear to have communication from the scrotum wound and thigh to rectum  Subjective:  pain is better controlled, no fever, hemodynamically stable He denies rectal pain, denies blood or pus in stool Friends at bedside He wants to go home, however , the culture is still pending   Assessment & Plan:   Active Problems:   HIV disease (HCC)   Cigarette smoker   Scrotal abscess   Marijuana use   Proctitis   Hypoalbuminemia due to protein-calorie malnutrition (HCC)   Perineal abscess   Fournier gangrene, left scrotum/thigh, sepsis ruled out -Status post I&D on May 25 by urology Dr. Alvester Morin -Twice daily wet-to-dry dressing -Culture in process -Continue current antibiotics  Intraop consult called for pus from the rectum/proctitis Intraoperatively: on DRE "friable with multiple small indurated areas. There did not appear to be a communication from the open wound in the left scrotum and thigh to the deep rectum" Pathology pending  general surgery recommended GI consult,  [facility] Dr Matthias Hughs input appreciated, s/p flex sig with  biopsy   Mild hyponatremia Sodium 132 Likely from dehydration Normalized with hydration ,now off hydration, adequate oral intake,   Microcytic anemia Appear new in 2022  iron study showed iron deficiency, will benefit from iron supplement once infection fully treated Hemoglobin around 10 Monitor F/u with pcp  HIV Continue home medication Appears several no-shows with ID follow-up States has called and scheduled ID follow up in two weeks  Cigarette smoking Nicotine patch provided   The patient's BMI is: Body mass index is 23.05 kg/m.Marland Kitchen     Unresulted Labs (From admission, onward)          Start     Ordered   03/09/21 0500  Creatinine, serum  Daily,   R      03/08/21 1107   03/06/21 1052  Stool culture (children & immunocomp patients)  Once,   R       Question:  Patient immune status  Answer:  Immunocompromised   03/06/21 1051            DVT prophylaxis: SCDs Start: 03/05/21 0135   Code Status: Full Family Communication: Patient Disposition:   Status is: Inpatient   Dispo: The patient is from: Home              Anticipated d/c is to: Home              Anticipated d/c date is: awaiting for culture                Consultants:   General surgery  Urology  GI  Procedures:   I&D left sacrum on 9/25 by urology Dr. Alvester Morin Rectum Exam under anesthesia, rectal biopsy by general surgery Dr. Carolynne Edouard Flex sig on 5/28  Antimicrobials:    Anti-infectives (From admission, onward)   Start     Dose/Rate Route Frequency Ordered Stop   03/08/21 2200  vancomycin (VANCOCIN) IVPB 1000 mg/200 mL premix        1,000 mg 200 mL/hr over 60 Minutes Intravenous Every 12 hours 03/08/21 1106     03/05/21 1715  clindamycin (CLEOCIN) IVPB 600 mg  Status:  Discontinued        600 mg 100 mL/hr over 30 Minutes Intravenous Every 8 hours 03/05/21 1615 03/07/21 1422   03/05/21 1000  bictegravir-emtricitabine-tenofovir AF (BIKTARVY) 50-200-25 MG per tablet 1 tablet        1 tablet  Oral Daily 03/05/21 0542     03/05/21 0600  piperacillin-tazobactam (ZOSYN) IVPB 3.375 g        3.375 g 12.5 mL/hr over 240 Minutes Intravenous Every 8 hours 03/04/21 2344     03/05/21 0600  vancomycin (VANCOCIN) IVPB 1000 mg/200 mL premix  Status:  Discontinued        1,000 mg 200 mL/hr over 60 Minutes Intravenous Every 8 hours 03/04/21 2344 03/08/21 1106   03/04/21 2215  vancomycin (VANCOREADY) IVPB 1500 mg/300 mL        1,500 mg 150 mL/hr over 120 Minutes Intravenous NOW 03/04/21 2206 03/05/21 0037   03/04/21 2215  piperacillin-tazobactam (ZOSYN) IVPB 3.375 g        3.375 g 100 mL/hr over 30 Minutes Intravenous  Once 03/04/21 2206 03/04/21 2301         Objective: Vitals:   03/08/21 0843 03/08/21 0931 03/08/21 0940 03/08/21 0950  BP: (!) 124/92 103/65 106/63 106/69  Pulse: 66 69 65 (!) 57  Resp:  17 18 16   Temp: 98.6 F (37 C) 98 F (36.7 C)    TempSrc: Oral Axillary    SpO2: 99% 100% 100% 100%  Weight:      Height: 6' (1.829 m)       Intake/Output Summary (Last 24 hours) at 03/08/2021 1326 Last data filed at 03/08/2021 0923 Gross per 24 hour  Intake 800 ml  Output --  Net 800 ml   Filed Weights   03/04/21 2044 03/05/21 1827  Weight: 77.1 kg 77.1 kg    Examination:  General exam: calm, NAD Respiratory system: Clear to auscultation. Respiratory effort normal. Cardiovascular system: S1 & S2 heard, RRR. No JVD, no murmur, No pedal edema. Gastrointestinal system: Abdomen is nondistended, soft and nontender.  Normal bowel sounds heard. Central nervous system: Alert and oriented. No focal neurological deficits. Extremities: Symmetric 5 x 5 power. Skin: surgical wound exam deferred  Psychiatry: Judgement and insight appear normal. Mood & affect appropriate.     Data Reviewed: I have personally reviewed following labs and imaging studies  CBC: Recent Labs  Lab 03/04/21 2150 03/05/21 0559 03/06/21 1157 03/07/21 0549 03/08/21 0516  WBC 10.6* 10.3 10.2 6.0  7.1  NEUTROABS 8.1*  --   --  3.8 4.3  HGB 10.4* 9.5* 10.1* 9.3* 9.4*  HCT 31.4* 31.8* 33.2* 31.3* 31.7*  MCV 80.9 76.3* 74.4* 74.9* 75.7*  PLT 411* 363 426* 376 406*    Basic Metabolic Panel: Recent Labs  Lab 03/04/21 2150 03/05/21 0559 03/06/21 1157 03/07/21 0549 03/08/21 0516  NA 133* 132* 133* 139 139  K 3.5 3.7 3.9 3.8 4.0  CL 100 101 99 105  103  CO2 26 25 26 30 30   GLUCOSE 86 88 110* 89 75  BUN 6 6 9 10 6   CREATININE 0.79 0.76 1.02 0.84 1.19  CALCIUM 8.8* 8.4* 8.5* 8.9 8.9  MG  --  1.7 2.0  --  2.1  PHOS  --  4.0  --   --   --     GFR: Estimated Creatinine Clearance: 101.7 mL/min (by C-G formula based on SCr of 1.19 mg/dL).  Liver Function Tests: Recent Labs  Lab 03/04/21 2150 03/05/21 0559  AST 16 12*  ALT 11 9  ALKPHOS 73 62  BILITOT 0.4 0.4  PROT 9.0* 8.2*  ALBUMIN 2.7* 2.3*    CBG: No results for input(s): GLUCAP in the last 168 hours.   Recent Results (from the past 240 hour(s))  Culture, blood (routine x 2)     Status: None (Preliminary result)   Collection Time: 03/04/21  9:08 PM   Specimen: BLOOD  Result Value Ref Range Status   Specimen Description BLOOD LEFT ANTECUBITAL  Final   Special Requests   Final    BOTTLES DRAWN AEROBIC AND ANAEROBIC Blood Culture adequate volume   Culture   Final    NO GROWTH 4 DAYS Performed at [facility], [address] [place], [place] [address]    Report Status PENDING  Incomplete  Aerobic Culture w Gram Stain (superficial specimen)     Status: None (Preliminary result)   Collection Time: 03/04/21  9:44 PM   Specimen: Abscess; Wound  Result Value Ref Range Status   Specimen Description   Final    ABSCESS Performed at [facility], [address] [place], [place] [address]    Special Requests   Final    RIGHT GROIN Performed at [facility], [address] [place], [place] [address]    Gram Stain   Final    NO WBC SEEN FEW GRAM POSITIVE COCCI FEW GRAM POSITIVE RODS    Culture   Final    RARE  STAPHYLOCOCCUS AUREUS REPEATING SUSCEPTIBILITY ABUNDANT CORYNEBACTERIUM SPECIES Standardized susceptibility testing for this organism is not available. Performed at [facility], [address] [place], [place] [address]    Report Status PENDING  Incomplete  Culture, blood (routine x 2)     Status: None (Preliminary result)   Collection Time: 03/04/21  9:50 PM   Specimen: BLOOD  Result Value Ref Range Status   Specimen Description BLOOD RIGHT ANTECUBITAL  Final   Special Requests   Final    BOTTLES DRAWN AEROBIC AND ANAEROBIC Blood Culture adequate volume   Culture   Final    NO GROWTH 4 DAYS Performed at [facility], [address] [place], [place] [address]    Report Status PENDING  Incomplete  Resp Panel by RT-PCR (Flu A&B, Covid) Groin, Right     Status: None   Collection Time: 03/04/21 10:01 PM   Specimen: Groin, Right; Nasopharyngeal(NP) swabs in vial transport medium  Result Value Ref Range Status   SARS Coronavirus 2 by RT PCR NEGATIVE NEGATIVE Final    Comment: (NOTE) SARS-CoV-2 target nucleic acids are NOT DETECTED.  The SARS-CoV-2 RNA is generally detectable in upper respiratory specimens during the acute phase of infection. The lowest concentration of SARS-CoV-2 viral copies this assay can detect is 138 copies/mL. A negative result does not preclude SARS-Cov-2 infection and should not be used as the sole basis for treatment or other patient management decisions. A negative result may occur with  improper specimen collection/handling, submission of specimen other than nasopharyngeal swab, presence of viral mutation(s) within the areas targeted by this assay, and inadequate number of viral copies(<138 copies/mL). A negative result must be combined with clinical observations, patient history, and epidemiological information. The expected result is Negative.  Fact Sheet for Patients:  BloggerCourse.com  Fact Sheet for Healthcare  Providers:  SeriousBroker.it  This test is no t yet approved or cleared by the [place] FDA and  has been authorized for detection and/or diagnosis of SARS-CoV-2 by FDA under an Emergency Use Authorization (EUA). This EUA will remain  in effect (meaning this test can be used) for the duration of the COVID-19 declaration under Section 564(b)(1) of the Act, 21 U.S.C.section 360bbb-3(b)(1), unless the authorization is terminated  or revoked sooner.       Influenza A by PCR NEGATIVE NEGATIVE Final   Influenza B by PCR NEGATIVE NEGATIVE Final    Comment: (NOTE) The Xpert Xpress SARS-CoV-2/FLU/RSV plus assay is intended as an aid in the diagnosis of influenza from Nasopharyngeal swab specimens and should not be used as a sole basis for treatment. Nasal washings and aspirates are unacceptable for Xpert Xpress SARS-CoV-2/FLU/RSV testing.  Fact Sheet for Patients: BloggerCourse.com  Fact Sheet for Healthcare Providers: SeriousBroker.it  This test is not yet approved or cleared by the [place] FDA and has been authorized for detection and/or diagnosis of SARS-CoV-2 by FDA under an Emergency Use Authorization (EUA). This EUA will remain in effect (meaning this test can be used) for the duration of the COVID-19 declaration under Section 564(b)(1) of the Act, 21 U.S.C. section 360bbb-3(b)(1), unless the authorization is terminated or revoked.  Performed at [facility], [address], [place], [place] [address]   MRSA PCR Screening     Status: Abnormal   Collection Time: 03/05/21  8:36 AM   Specimen: Nasopharyngeal  Result Value Ref Range Status   MRSA by PCR POSITIVE (A) NEGATIVE Final    Comment:        The GeneXpert MRSA Assay (FDA approved for NASAL specimens only), is one component of a comprehensive MRSA colonization surveillance program. It is not intended to diagnose MRSA infection nor to  guide or monitor treatment for MRSA infections. RESULT CALLED TO, READ BACK BY AND VERIFIED WITH: WATSON,K AT 1405 BY HUFFINES,S ON 03/05/21. Performed at [facility], [address] [place], [place] [address]          Radiology Studies: No results found.      Scheduled Meds: . bictegravir-emtricitabine-tenofovir AF  1 tablet Oral Daily  . Chlorhexidine Gluconate Cloth  6 each Topical Q0600  . docusate sodium  100 mg Oral BID  . feeding supplement  237 mL Oral BID BM  . mupirocin ointment  1 application Nasal BID  . nicotine  21 mg Transdermal Daily   Continuous Infusions: . piperacillin-tazobactam (ZOSYN)  IV 3.375 g (03/08/21 0504)  . vancomycin       LOS: 3 days   Time spent: Greater than 50% of this time was spent in counseling, explanation of diagnosis, planning of further management, and coordination of care.   Voice Recognition Reubin Milan dictation system was used to create this note, attempts have been made to correct errors. Please contact the author with questions and/or clarifications.   Albertine Grates, MD PhD FACP [facility]  Available via Epic secure chat 7am-7pm for nonurgent issues Please page for urgent issues To page the attending provider between 7A-7P or the covering provider  during after hours 7P-7A, please log into the web site [email or web address] and access using universal Anthony password for that web site. If you do not have the password, please call the hospital operator.    03/08/2021, 1:26 PM

## 2021-03-08 NOTE — Progress Notes (Signed)
Patient ID: Julian Sanchez, male   DOB: October 09, 1996, 25 y.o.   MRN: [id number]  Wound care per Urology. No general surgery follow-up needed. Discharge per primary team.  Call us with questions.  Wilmon Arms. Corliss Skains, MD, [facility] Surgery  General/ Trauma Surgery   03/08/2021 8:02 AM

## 2021-03-09 DIAGNOSIS — K6289 Other specified diseases of anus and rectum: Secondary | ICD-10-CM

## 2021-03-09 LAB — CREATININE, SERUM
Creatinine, Ser: 1.7 mg/dL — ABNORMAL HIGH (ref 0.61–1.24)
GFR, Estimated: 56 mL/min — ABNORMAL LOW (ref >60)

## 2021-03-09 LAB — AEROBIC CULTURE W GRAM STAIN (SUPERFICIAL SPECIMEN): Gram Stain: NONE SEEN

## 2021-03-09 MED ORDER — ORITAVANCIN DIPHOSPHATE 400 MG IV SOLR
1200.0000 mg | Freq: Once | INTRAVENOUS | Status: AC
  Administered 2021-03-09: 1200 mg via INTRAVENOUS
  Filled 2021-03-09: qty 120

## 2021-03-09 MED ORDER — POLYETHYLENE GLYCOL 3350 17 G PO PACK: 17.0000 g | PACK | Freq: Every day | ORAL | 0 refills | Status: DC | PRN

## 2021-03-09 MED ORDER — HYDROCODONE-ACETAMINOPHEN 5-325 MG PO TABS: 1.0000 | ORAL_TABLET | Freq: Three times a day (TID) | ORAL | 0 refills | Status: AC | PRN

## 2021-03-09 MED ORDER — DOXYCYCLINE HYCLATE 100 MG PO TABS
100.0000 mg | ORAL_TABLET | Freq: Two times a day (BID) | ORAL | Status: DC
  Administered 2021-03-09: 100 mg via ORAL
  Filled 2021-03-09: qty 1

## 2021-03-09 MED ORDER — SACCHAROMYCES BOULARDII 250 MG PO CAPS: 250.0000 mg | ORAL_CAPSULE | Freq: Two times a day (BID) | ORAL | 0 refills | Status: AC

## 2021-03-09 MED ORDER — DOXYCYCLINE HYCLATE 100 MG PO TABS
100.0000 mg | ORAL_TABLET | Freq: Two times a day (BID) | ORAL | 0 refills | Status: DC
Start: 2021-03-09 — End: 2021-03-09

## 2021-03-09 MED ORDER — AMOXICILLIN-POT CLAVULANATE 875-125 MG PO TABS
1.0000 | ORAL_TABLET | Freq: Two times a day (BID) | ORAL | Status: DC
  Administered 2021-03-09: 1 via ORAL
  Filled 2021-03-09: qty 1

## 2021-03-09 MED ORDER — SACCHAROMYCES BOULARDII 250 MG PO CAPS
250.0000 mg | ORAL_CAPSULE | Freq: Two times a day (BID) | ORAL | Status: DC
  Filled 2021-03-09: qty 1

## 2021-03-09 MED ORDER — AMOXICILLIN-POT CLAVULANATE 875-125 MG PO TABS: 1.0000 | ORAL_TABLET | Freq: Two times a day (BID) | ORAL | 0 refills | Status: AC

## 2021-03-09 NOTE — Progress Notes (Addendum)
ID PROGRESS NOTE  Julian Sanchez is a 25yo M with HIV disease, on biktarvy, previously poorly controlled. Admitted for scrotal cellulitis and abscess/fournier's gangrene. S/p debridement. OR report found a 10cm superficial abscess tract along left side of scrotum.  Thigh had 2 communicating wounds that also were opened. C/w infected hidradenitis. There was purulence from rectum that was inspected and thought to be c/w proctitis. OR cultures showing MRSA (R tetra) and corynebacterium.  Patient has not been seen by dr Julian Sanchez > 66yr. He reports he is to come to the [facility] for visit , but no appt is showing up in record. He would like to leave (thus far he has received 5 days of vanco/piptazo)  -  Plan:  Polymicrobial scrotal cellulitis/abscess/infected hydradinitis = will give a dose of oritavancin plus ( amox/clav 3 wk)  HIV disease= will test for cd 4 count, HIV VL, plus RPR;  At clinic appt will need to do STI work up to see if proctitis has cleared or due to STI and refill and get access to refills We will give him appt with DR Julian Sanchez at 11:15 on June 1st.  Julian Scantlebury B. Drue Second MD MPH [facility] [phone]

## 2021-03-09 NOTE — Discharge Summary (Signed)
Discharge Summary  Julian Sanchez:[id number] DOB: [birth year]  PCP: Patient, No Pcp Per (Inactive)  Admit date: 03/04/2021 Discharge date: 03/09/2021  Time spent: , more than 50% time spent on coordination of care. Case discussed with ID and urology prior to discharge  Recommendations for Outpatient Follow-up:  1. F/u with PCP within a week  for hospital discharge follow up 2. F/u with ID Dr Orvan Falconer on 6/1 3. F/u with urology  Dr Alvester Morin in two weeks 4. F/[facility] with [facility] for biopsy result  Labs/test result to follow up: Need to monitor renal function at hospital discharge follow up Stool culture was sent , result pending  Surgical pathology sent, result pending   Discharge Diagnoses:  Active Hospital Problems   Diagnosis Date Noted  . Scrotal abscess 03/05/2021  . Marijuana use 03/05/2021  . 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 Hypoalbuminemia due to protein-calorie malnutrition (HCC) 03/05/2021  . Perineal abscess   . HIV disease (HCC) 08/19/2015  . Cigarette smoker 08/19/2015    Resolved Hospital Problems  No resolved problems to display.    Discharge Condition: stable  Diet recommendation: regular diet  Filed Weights   03/04/21 2044 03/05/21 1827  Weight: 77.1 kg 77.1 kg    History of present illness: (Per admitting MD Dr Thomes Dinning) Chief Complaint: Left sided groin abscess  HPI: Julian Sanchez is a 25 y.o. male with medical history significant for HIV who presents to the emergency department due to 3 to 4 days of recurrent groin abscess.  He complained that the area has been draining and painful on ambulation unless he widens his gait.  Patient states that he has had recurrent episodes of similar symptoms, several of which usually self resolve.  He presents to the emergency department for further evaluation.  He denies fever, nausea, vomiting or abdominal pain.  ED Course:  In the emergency department, he was febrile with a temperature of 100.71F and tachycardic at rate  of 103 bpm other vital signs are within normal range.  Work-up in the ED showed mild leukocytosis and mild hyponatremia.  Albumin 2.7.  Influenza A, B, SARS-CoV-2 was negative. CT pelvis with contrast showed proctitis and left scrotal abscess. Chest x-ray showed no acute cardiopulmonary abnormality General surgery was consulted by ED PA and will see patient in the morning once admitted by hospitalist.  Hospital Course:  Active Problems:   HIV disease (HCC)   Cigarette smoker   Scrotal abscess   Marijuana use   Proctitis   Hypoalbuminemia due to protein-calorie malnutrition (HCC)   Perineal abscess    Fournier gangrene, left scrotum/thigh, sepsis ruled out -Status post I&D on May 25 by urology Dr. Alvester Morin -Twice daily wet-to-dry dressing -Culture  mrsa , resistant to doxycycline, erythromycin, oxacillin -He received Vanco and Zosyn in the hospital, ID consulted, 1 dose of oritavancin given on 5/29, discharged on Augmentin -Follow-up with ID on 6/1    Intraop consult called for pus from the rectum/proctitis Intraoperatively: on DRE "friable with multiple small indurated areas. There did not appear to be a communication from the open wound in the left scrotum and thigh to the deep rectum" Pathology pending  general surgery recommended GI consult,  [facility] Dr Matthias Hughs input appreciated, s/p flex sig with biopsy Received Zosyn in the hospital, discharged on Augmentin  Slight bump of creatinine, likely from Vanco and Zosyn Repeat BMP at hospital discharge follow-up   Mild hyponatremia Sodium 132 Likely from dehydration Normalized with hydration ,now off hydration, adequate oral  intake,   Microcytic anemia Appear new in 2022  iron study showed iron deficiency, will benefit from iron supplement once infection fully treated Hemoglobin around 10 Monitor F/u with pcp  HIV Continue home medication Appears several no-shows with ID follow-up ID consulted, appointment scheduled  on 6/1 with Dr. Orvan Falconer  Cigarette smoking Nicotine patch provided   The patient's BMI is: Body mass index is 23.05 kg/m.Marland Kitchen  DVT prophylaxis: SCDs Start: 03/05/21 0135   Code Status: Full Family Communication: Patient Disposition:  Home  Consultants:   General surgery  Urology  GI  ID  Procedures:   I&D left sacrum on 9/25 by urology Dr. Alvester Morin Rectum Exam under anesthesia, rectal biopsy by general surgery Dr. Carolynne Edouard Flex sig on 5/28  Antimicrobials: Anti-infectives (From admission, onward)   Start     Dose/Rate Route Frequency Ordered Stop   03/09/21 1300  Oritavancin Diphosphate (ORBACTIV) 1,200 mg in dextrose 5 % IVPB        1,200 mg 333.3 mL/hr over 180 Minutes Intravenous Once 03/09/21 1125 03/09/21 1502   03/09/21 1145  doxycycline (VIBRA-TABS) tablet 100 mg  Status:  Discontinued        100 mg Oral Every 12 hours 03/09/21 1054 03/09/21 1336   03/09/21 1145  amoxicillin-clavulanate (AUGMENTIN) 875-125 MG per tablet 1 tablet        1 tablet Oral Every 12 hours 03/09/21 1054     03/09/21 0000  amoxicillin-clavulanate (AUGMENTIN) 875-125 MG tablet        1 tablet Oral Every 12 hours 03/09/21 1111 03/31/21 2359   03/09/21 0000  doxycycline (VIBRA-TABS) 100 MG tablet  Status:  Discontinued        100 mg Oral Every 12 hours 03/09/21 1111 03/09/21    03/08/21 2200  vancomycin (VANCOCIN) IVPB 1000 mg/200 mL premix  Status:  Discontinued        1,000 mg 200 mL/hr over 60 Minutes Intravenous Every 12 hours 03/08/21 1106 03/09/21 1054   03/05/21 1715  clindamycin (CLEOCIN) IVPB 600 mg  Status:  Discontinued        600 mg 100 mL/hr over 30 Minutes Intravenous Every 8 hours 03/05/21 1615 03/07/21 1422   03/05/21 1000  bictegravir-emtricitabine-tenofovir AF (BIKTARVY) 50-200-25 MG per tablet 1 tablet        1 tablet Oral Daily 03/05/21 0542     03/05/21 0600  piperacillin-tazobactam (ZOSYN) IVPB 3.375 g  Status:  Discontinued        3.375 g 12.5 mL/hr over 240  Minutes Intravenous Every 8 hours 03/04/21 2344 03/09/21 1054   03/05/21 0600  vancomycin (VANCOCIN) IVPB 1000 mg/200 mL premix  Status:  Discontinued        1,000 mg 200 mL/hr over 60 Minutes Intravenous Every 8 hours 03/04/21 2344 03/08/21 1106   03/04/21 2215  vancomycin (VANCOREADY) IVPB 1500 mg/300 mL        1,500 mg 150 mL/hr over 120 Minutes Intravenous NOW 03/04/21 2206 03/05/21 0037   03/04/21 2215  piperacillin-tazobactam (ZOSYN) IVPB 3.375 g        3.375 g 100 mL/hr over 30 Minutes Intravenous  Once 03/04/21 2206 03/04/21 2301        Discharge Exam: BP 131/77 (BP Location: Left Arm)   Pulse 62   Temp 98.6 F (37 C) (Oral)   Resp 16   Ht 6' (1.829 m)   Wt 77.1 kg   SpO2 100%   BMI 23.05 kg/m   General: NAD Cardiovascular: RRR Respiratory: Normal respiratory  effort  Discharge Instructions You were cared for by a hospitalist during your hospital stay. If you have any questions about your discharge medications or the care you received while you were in the hospital after you are discharged, you can call the unit and asked to speak with the hospitalist on call if the hospitalist that took care of you is not available. Once you are discharged, your primary care physician will handle any further medical issues. Please note that NO REFILLS for any discharge medications will be authorized once you are discharged, as it is imperative that you return to your primary care physician (or establish a relationship with a primary care physician if you do not have one) for your aftercare needs so that they can reassess your need for medications and monitor your lab values.  Discharge Instructions    Diet general   Complete by: As directed    Discharge wound care:   Complete by: As directed    Wet to dry dressing twice a day to left groin   Increase activity slowly   Complete by: As directed      Allergies as of 03/09/2021   No Known Allergies     Medication List    TAKE  these medications   amoxicillin-clavulanate 875-125 MG tablet Commonly known as: AUGMENTIN Take 1 tablet by mouth every 12 (twelve) hours for 22 days.   bictegravir-emtricitabine-tenofovir AF 50-200-25 MG Tabs tablet Commonly known as: Biktarvy Take 1 tablet by mouth daily.   HYDROcodone-acetaminophen 5-325 MG tablet Commonly known as: NORCO/VICODIN Take 1 tablet by mouth every 8 (eight) hours as needed for up to 7 days for moderate pain. Please do not drive or operate machine after taking norco   polyethylene glycol 17 g packet Commonly known as: MIRALAX / GLYCOLAX Take 17 g by mouth daily as needed for mild constipation.   saccharomyces boulardii 250 MG capsule Commonly known as: Florastor Take 1 capsule (250 mg total) by mouth 2 (two) times daily.            Discharge Care Instructions  (From admission, onward)         Start     Ordered   03/09/21 0000  Discharge wound care:       Comments: Wet to dry dressing twice a day to left groin   03/09/21 1016         No Known Allergies  Follow-up Information    [facility] Follow up.   Why: I was unable to make an appointment for you today [Friday] as they were closed for the holiday weekend.  You can call on Tuesday or go to the website to set up a hospital follow up appointment Contact information: Address: [address] [place], [place] [address] Phone: [phone] Appointments: [email or web address] Giovanni, MD Follow up.   Specialty: Gastroenterology Why: follow up with biopsy result Contact information: [address] [place] [address] [phone]        Crista Elliot, MD. Call in 2 week(s).   Specialty: Urology Why: for wound check  Contact information: [address] [place] [address] [phone]        Cliffton Asters, MD Follow up on 03/12/2021.   Specialty: Infectious Diseases Why: for HIV management and wound infection  appointment at  11:15, please arrive at 11 am Contact information: [address] [place] [address] [phone]  The results of significant diagnostics from this hospitalization (including imaging, microbiology, ancillary and laboratory) are listed below for reference.    Significant Diagnostic Studies: CT PELVIS W CONTRAST  Result Date: 03/04/2021 CLINICAL DATA:  abscess to left groin for past 3 days with drainage. Pt denies any fevers or N/V. More painful today. After IV dye injeciton pt. Rolled onto side and started vomiting. Initial scan delayed due to vomiting. EXAM: CT PELVIS WITH CONTRAST TECHNIQUE: Multidetector CT imaging of the pelvis was performed using the standard protocol following the bolus administration of intravenous contrast. CONTRAST:  OMNIPAQUE IOHEXOL 300 MG/ML  SOLN COMPARISON:  CT abdomen pelvis 03/05/2020 FINDINGS: Urinary Tract: Partially visualized horseshoe kidney. Otherwise unremarkable. Bowel: The rectal wall is thickened with mucosal hyperemia and limited evaluation for perirectal abscess due to timing of intravenous contrast. Perirectal fat stranding. Vascular/Lymphatic: No pathologically enlarged lymph nodes. No significant vascular abnormality seen. Reproductive:  No mass or other significant abnormality Other: No intraperitoneal free fluid. No intraperitoneal free gas. No organized fluid collection. other Musculoskeletal: Abscess formation with the largest pocket of 1.8 x 1.4 cm centered along the lateral superior left scrotum. There is extension of the abscess posteriorly along the perineum with a thin elongated 10 x 1 cm fluid collection. There is also extension of the abscess superiorly along the spermatic cord within other pocket of fluid measuring 1.7 x 1.2 cm (2:50). Another tiny pocket of fluid is noted along the medial posterior left thigh measuring 1.1 x 0.6 cm (2:63), this is also noted to be contiguous with the other fluid  collections. Associated subcutaneus soft tissue edema and dermal thickening noted. No subcutaneus soft tissue emphysema. No cortical erosion or destruction. No suspicious lytic or blastic osseous lesions. No acute displaced fracture. IMPRESSION: 1. Proctitis.  Underlying malignancy not excluded. 2. Left scrotal abscess formation measuring up to 1.8 x 1.4 cm that is contiguous with a couple of other fluid collections that extend posteriorly toward the perineum, superiorly along the spermatic cord, and laterally to the medial left superior thigh. Associated subcutaneus soft tissue fat stranding and dermal thickening. No associated subcutaneus soft tissue emphysema; however, please note a necrotizing fasciitis cannot be excluded as this is a clinical diagnosis. Please of poor evaluation of the testes on CT, consider ultrasound for more sensitive evaluation. 3. Incidental horseshoe kidney. Electronically Signed   By: Tish Frederickson M.D.   On: 03/04/2021 23:17   DG Chest Port 1 View  Result Date: 03/04/2021 CLINICAL DATA:  Fever, weakness, groin abscess EXAM: PORTABLE CHEST 1 VIEW COMPARISON:  03/27/2010 FINDINGS: No consolidation, features of edema, pneumothorax, or effusion. Pulmonary vascularity is normally distributed. The cardiomediastinal contours are unremarkable. No acute osseous or soft tissue abnormality. IMPRESSION: No acute cardiopulmonary abnormality. Electronically Signed   By: Kreg Shropshire M.D.   On: 03/04/2021 22:08   US SCROTUM W/DOPPLER  Addendum Date: 03/05/2021   ADDENDUM REPORT: 03/05/2021 13:15 ADDENDUM: Discussed with Dr. Henreitta Leber on 03/05/2021 at 1315 hours. Electronically Signed   By: Ulyses Southward M.D.   On: 03/05/2021 13:15   Result Date: 03/05/2021 CLINICAL DATA:  LEFT scrotal abscess, symptoms for 3 months EXAM: SCROTAL ULTRASOUND DOPPLER ULTRASOUND OF THE TESTICLES TECHNIQUE: Complete ultrasound examination of the testicles, epididymis, and other scrotal structures was performed.  Color and spectral Doppler ultrasound were also utilized to evaluate blood flow to the testicles. COMPARISON:  CT pelvis 03/04/2021 FINDINGS: Right testicle Measurements: 3.7 x 1.9 x 2.5 cm. Normal echogenicity without mass or calcification. Internal blood  flow present on color Doppler imaging. Left testicle Measurements: 3.41.5 x 2.5 cm. Normal echogenicity without mass or calcification. Internal blood flow present on color Doppler imaging. Right epididymis:  Normal in size and appearance. Left epididymis: Normal in size and appearance. Slightly hypervascular. Hydrocele:  Trace RIGHT hydrocele fluid Varicocele:  None visualized. Pulsed Doppler interrogation of both testes demonstrates normal low resistance arterial and venous waveforms bilaterally. Extensive soft tissue swelling of LEFT hemiscrotum extending into LEFT groin and LEFT perineum. Multiple loculated fluid collections are seen consistent with abscesses. A more anterior superior abscess measures 1.6 cm. A second collection extending posteriorly towards the perineum and anus measures 10 mm diameter and 3.3 cm length. A third more posterior collection measures 1.3 cm diameter. A few scattered foci of bright punctate hyperechogenicity are seen, particularly within the small abscess collections, without definite shadowing; these are concerning for developing foci of soft tissue gas/pneumatosis. Reactive LEFT inguinal lymph nodes identified IMPRESSION: Normal appearing testes and epididymi. Significant soft tissue swelling LEFT hemiscrotum extending into LEFT groin and posteriorly into perineum to perianal region. Focal complex collections are seen within the area of soft tissue edema consistent with abscesses, with 3 discrete collections measuring 1.6 cm, 3.3 cm, and 1.3 cm in greatest sizes. Both of these collections contain foci of punctate hyperechogenicity concerning for gas. 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 Critical Value/emergent  results were called by telephone at the time of interpretation on 03/05/2021 at 12:33 pm to provider [facility] , who verbally acknowledged these results. Electronically Signed: By: Ulyses Southward M.D. On: 03/05/2021 12:36    Microbiology: Recent Results (from the past 240 hour(s))  Culture, blood (routine x 2)     Status: None (Preliminary result)   Collection Time: 03/04/21  9:08 PM   Specimen: BLOOD  Result Value Ref Range Status   Specimen Description BLOOD LEFT ANTECUBITAL  Final   Special Requests   Final    BOTTLES DRAWN AEROBIC AND ANAEROBIC Blood Culture adequate volume   Culture   Final    NO GROWTH 4 DAYS Performed at [facility], [address], [place], [place] [address]    Report Status PENDING  Incomplete  Aerobic Culture w Gram Stain (superficial specimen)     Status: None   Collection Time: 03/04/21  9:44 PM   Specimen: Abscess; Wound  Result Value Ref Range Status   Specimen Description   Final    ABSCESS Performed at [facility], [address] [place], [place] [address]    Special Requests   Final    RIGHT GROIN Performed at [facility], [address], [place], [place] [address]    Gram Stain   Final    NO WBC SEEN FEW GRAM POSITIVE COCCI FEW GRAM POSITIVE RODS    Culture   Final    RARE METHICILLIN RESISTANT STAPHYLOCOCCUS AUREUS ABUNDANT CORYNEBACTERIUM SPECIES Standardized susceptibility testing for this organism is not available. Performed at [facility], [address] [place], [place] [address]    Report Status 03/09/2021 FINAL  Final   Organism ID, Bacteria METHICILLIN RESISTANT STAPHYLOCOCCUS AUREUS  Final      Susceptibility   Methicillin resistant staphylococcus aureus - MIC*    CIPROFLOXACIN <=0.5 SENSITIVE Sensitive     ERYTHROMYCIN >=8 RESISTANT Resistant     GENTAMICIN <=0.5 SENSITIVE Sensitive     OXACILLIN >=4 RESISTANT Resistant     TETRACYCLINE >=16 RESISTANT Resistant     VANCOMYCIN <=0.5 SENSITIVE Sensitive  TRIMETH/SULFA <=10 SENSITIVE Sensitive     CLINDAMYCIN <=0.25 SENSITIVE Sensitive     RIFAMPIN <=0.5 SENSITIVE Sensitive     Inducible Clindamycin NEGATIVE Sensitive     * RARE METHICILLIN RESISTANT STAPHYLOCOCCUS AUREUS  Culture, blood (routine x 2)     Status: None (Preliminary result)   Collection Time: 03/04/21  9:50 PM   Specimen: BLOOD  Result Value Ref Range Status   Specimen Description BLOOD RIGHT ANTECUBITAL  Final   Special Requests   Final    BOTTLES DRAWN AEROBIC AND ANAEROBIC Blood Culture adequate volume   Culture   Final    NO GROWTH 4 DAYS Performed at [facility], [address] [place], [place] [address]    Report Status PENDING  Incomplete  Resp Panel by RT-PCR (Flu A&B, Covid) Groin, Right     Status: None   Collection Time: 03/04/21 10:01 PM   Specimen: Groin, Right; Nasopharyngeal(NP) swabs in vial transport medium  Result Value Ref Range Status   SARS Coronavirus 2 by RT PCR NEGATIVE NEGATIVE Final    Comment: (NOTE) SARS-CoV-2 target nucleic acids are NOT DETECTED.  The SARS-CoV-2 RNA is generally detectable in upper respiratory specimens during the acute phase of infection. The lowest concentration of SARS-CoV-2 viral copies this assay can detect is 138 copies/mL. A negative result does not preclude SARS-Cov-2 infection and should not be used as the sole basis for treatment or other patient management decisions. A negative result may occur with  improper specimen collection/handling, submission of specimen other than nasopharyngeal swab, presence of viral mutation(s) within the areas targeted by this assay, and inadequate number of viral copies(<138 copies/mL). A negative result must be combined with clinical observations, patient history, and epidemiological information. The expected result is Negative.  Fact Sheet for Patients:  BloggerCourse.comhttps://www.fda.gov/media/152166/download  Fact Sheet for Healthcare Providers:   SeriousBroker.ithttps://www.fda.gov/media/152162/download  This test is no t yet approved or cleared by the [place] FDA and  has been authorized for detection and/or diagnosis of SARS-CoV-2 by FDA under an Emergency Use Authorization (EUA). This EUA will remain  in effect (meaning this test can be used) for the duration of the COVID-19 declaration under Section 564(b)(1) of the Act, 21 U.S.C.section 360bbb-3(b)(1), unless the authorization is terminated  or revoked sooner.       Influenza A by PCR NEGATIVE NEGATIVE Final   Influenza B by PCR NEGATIVE NEGATIVE Final    Comment: (NOTE) The Xpert Xpress SARS-CoV-2/FLU/RSV plus assay is intended as an aid in the diagnosis of influenza from Nasopharyngeal swab specimens and should not be used as a sole basis for treatment. Nasal washings and aspirates are unacceptable for Xpert Xpress SARS-CoV-2/FLU/RSV testing.  Fact Sheet for Patients: BloggerCourse.comhttps://www.fda.gov/media/152166/download  Fact Sheet for Healthcare Providers: SeriousBroker.ithttps://www.fda.gov/media/152162/download  This test is not yet approved or cleared by the [place] FDA and has been authorized for detection and/or diagnosis of SARS-CoV-2 by FDA under an Emergency Use Authorization (EUA). This EUA will remain in effect (meaning this test can be used) for the duration of the COVID-19 declaration under Section 564(b)(1) of the Act, 21 U.S.C. section 360bbb-3(b)(1), unless the authorization is terminated or revoked.  Performed at [facility], [address] [place], [place] [address]   MRSA PCR Screening     Status: Abnormal   Collection Time: 03/05/21  8:36 AM   Specimen: Nasopharyngeal  Result Value Ref Range Status   MRSA by PCR POSITIVE (A) NEGATIVE Final    Comment:  The GeneXpert MRSA Assay (FDA approved for NASAL specimens only), is one component of a comprehensive MRSA colonization surveillance program. It is not intended to diagnose MRSA infection nor to guide  or monitor treatment for MRSA infections. RESULT CALLED TO, READ BACK BY AND VERIFIED WITH: WATSON,K AT 1405 BY HUFFINES,S ON 03/05/21. Performed at [facility], [address], [place], [place] [address]      Labs: Basic Metabolic Panel: Recent Labs  Lab 03/04/21 2150 03/05/21 0559 03/06/21 1157 03/07/21 0549 03/08/21 0516 03/09/21 0519  NA 133* 132* 133* 139 139  --   K 3.5 3.7 3.9 3.8 4.0  --   CL 100 101 99 105 103  --   CO2 26 25 26 30 30   --   GLUCOSE 86 88 110* 89 75  --   BUN 6 6 9 10 6   --   CREATININE 0.79 0.76 1.02 0.84 1.19 1.70*  CALCIUM 8.8* 8.4* 8.5* 8.9 8.9  --   MG  --  1.7 2.0  --  2.1  --   PHOS  --  4.0  --   --   --   --    Liver Function Tests: Recent Labs  Lab 03/04/21 2150 03/05/21 0559  AST 16 12*  ALT 11 9  ALKPHOS 73 62  BILITOT 0.4 0.4  PROT 9.0* 8.2*  ALBUMIN 2.7* 2.3*   No results for input(s): LIPASE, AMYLASE in the last 168 hours. No results for input(s): AMMONIA in the last 168 hours. CBC: Recent Labs  Lab 03/04/21 2150 03/05/21 0559 03/06/21 1157 03/07/21 0549 03/08/21 0516  WBC 10.6* 10.3 10.2 6.0 7.1  NEUTROABS 8.1*  --   --  3.8 4.3  HGB 10.4* 9.5* 10.1* 9.3* 9.4*  HCT 31.4* 31.8* 33.2* 31.3* 31.7*  MCV 80.9 76.3* 74.4* 74.9* 75.7*  PLT 411* 363 426* 376 406*   Cardiac Enzymes: No results for input(s): CKTOTAL, CKMB, CKMBINDEX, TROPONINI in the last 168 hours. BNP: BNP (last 3 results) No results for input(s): BNP in the last 8760 hours.  ProBNP (last 3 results) No results for input(s): PROBNP in the last 8760 hours.  CBG: No results for input(s): GLUCAP in the last 168 hours.     Signed:  03/09/21 MD, PhD, FACP  [facility] 03/09/2021, 2:31 PM

## 2021-03-10 ENCOUNTER — Encounter (HOSPITAL_COMMUNITY): Payer: Self-pay | Admitting: Gastroenterology

## 2021-03-10 LAB — CULTURE, BLOOD (ROUTINE X 2)
Culture: NO GROWTH
Culture: NO GROWTH
Special Requests: ADEQUATE
Special Requests: ADEQUATE

## 2021-03-10 LAB — SURGICAL PATHOLOGY

## 2021-03-10 LAB — RPR
RPR Ser Ql: REACTIVE — AB
RPR Titer: 1:64 {titer}

## 2021-03-11 LAB — STOOL CULTURE

## 2021-03-11 LAB — SURGICAL PATHOLOGY

## 2021-03-11 LAB — HIV-1 RNA QUANT-NO REFLEX-BLD
HIV 1 RNA Quant: 580 copies/mL
LOG10 HIV-1 RNA: 2.763 log10copy/mL

## 2021-03-12 ENCOUNTER — Ambulatory Visit: Payer: Self-pay

## 2021-03-12 ENCOUNTER — Ambulatory Visit (INDEPENDENT_AMBULATORY_CARE_PROVIDER_SITE_OTHER): Payer: Self-pay | Admitting: Internal Medicine

## 2021-03-12 ENCOUNTER — Encounter: Payer: Self-pay | Admitting: Internal Medicine

## 2021-03-12 ENCOUNTER — Other Ambulatory Visit: Payer: Self-pay

## 2021-03-12 VITALS — BP 116/73 | HR 101 | Temp 98.9°F | Wt 144.0 lb

## 2021-03-12 DIAGNOSIS — L02215 Cutaneous abscess of perineum: Secondary | ICD-10-CM

## 2021-03-12 DIAGNOSIS — K6289 Other specified diseases of anus and rectum: Secondary | ICD-10-CM

## 2021-03-12 DIAGNOSIS — A64 Unspecified sexually transmitted disease: Secondary | ICD-10-CM

## 2021-03-12 DIAGNOSIS — B20 Human immunodeficiency virus [HIV] disease: Secondary | ICD-10-CM

## 2021-03-12 LAB — T.PALLIDUM AB, TOTAL: T Pallidum Abs: REACTIVE — AB

## 2021-03-12 MED ORDER — PENICILLIN G BENZATHINE 1200000 UNIT/2ML IM SUSY
2.4000 10*6.[IU] | PREFILLED_SYRINGE | Freq: Once | INTRAMUSCULAR | Status: AC
  Administered 2021-03-12: 2.4 10*6.[IU] via INTRAMUSCULAR

## 2021-03-12 MED ORDER — BIKTARVY 50-200-25 MG PO TABS: 1.0000 | ORAL_TABLET | Freq: Every day | ORAL | 11 refills | Status: AC

## 2021-03-12 NOTE — Assessment & Plan Note (Signed)
His abscess is resolving following surgery and antibiotic therapy.  He has follow-up with urology in 2 weeks.

## 2021-03-12 NOTE — Assessment & Plan Note (Signed)
I will check urine and rectal swabs for GC and chlamydia.  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

## 2021-03-12 NOTE — Assessment & Plan Note (Signed)
Somewhat surprisingly his viral load is fairly low even though he had not been taking his Biktarvy.  His ADAP has been approved.  I will check a CD4 count today and see him back in 4 weeks.

## 2021-03-12 NOTE — Progress Notes (Signed)
Patient Active Problem List   Diagnosis Date Noted  . Scrotal abscess 03/05/2021    Priority: High  . 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 HIV disease (HCC) 08/19/2015    Priority: High  . Marijuana use 03/05/2021  . 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 Hypoalbuminemia due to protein-calorie malnutrition (HCC) 03/05/2021  . Perineal abscess   . Peritonsillar abscess 07/23/2017  . Homelessness 02/02/2017  . Depression 09/10/2015  . 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 Cigarette smoker 08/19/2015    Patient's Medications  New Prescriptions   No medications on file  Previous Medications   AMOXICILLIN-CLAVULANATE (AUGMENTIN) 875-125 MG TABLET    Take 1 tablet by mouth every 12 (twelve) hours for 22 days.   HYDROCODONE-ACETAMINOPHEN (NORCO/VICODIN) 5-325 MG TABLET    Take 1 tablet by mouth every 8 (eight) hours as needed for up to 7 days for moderate pain. Please do not drive or operate machine after taking norco   POLYETHYLENE GLYCOL (MIRALAX / GLYCOLAX) 17 G PACKET    Take 17 g by mouth daily as needed for mild constipation.   SACCHAROMYCES BOULARDII (FLORASTOR) 250 MG CAPSULE    Take 1 capsule (250 mg total) by mouth 2 (two) times daily.  Modified Medications   Modified Medication Previous Medication   BICTEGRAVIR-EMTRICITABINE-TENOFOVIR AF (BIKTARVY) 50-200-25 MG TABS TABLET bictegravir-emtricitabine-tenofovir AF (BIKTARVY) 50-200-25 MG TABS tablet      Take 1 tablet by mouth daily.    Take 1 tablet by mouth daily.  Discontinued Medications   No medications on file    Subjective: Julian Sanchez is in for his hospital follow-up visit.  This is his first visit in 2 years.  He tells me that after his visit 2 years ago he never started [place] until he was hospitalized on 03/04/2021 with a left groin/scrotal abscess.  He underwent incision and drainage.  Abscess cultures grew MRSA and corynebacterium.  He was treated with IV vancomycin and piperacillin tazobactam for 5 days in the  hospital and then discharged on amoxicillin clavulanate after receiving 1 dose of IV oritavancin.  During surgery he was noted to have some rectal drainage and was diagnosed with proctitis.  Studies for GC and chlamydia were not obtained.  He is feeling much better.  His RPR was elevated while hospitalized at 1:64.  Interestingly, his viral load was relatively low at 580 on the day of discharge after being started on [place] upon admission.  No CD4 count was obtained.  He says that he did not start Biktarvy after his last visit because he was homeless and dealing with lots of issues including depression.  He is now living with a close friend in [place].  He just recently got a new job working at [employer].  He continues to smoke cigarettes.  Review of Systems: Review of Systems  Constitutional: Positive for malaise/fatigue. Negative for chills and fever.  Respiratory: Negative for cough and shortness of breath.   Cardiovascular: Negative for chest pain.  Gastrointestinal: Negative for abdominal pain, diarrhea, nausea and vomiting.  Genitourinary: Negative for dysuria.  Psychiatric/Behavioral: Positive for depression.    Past Medical History:  Diagnosis Date  . ADHD (attention deficit hyperactivity disorder)   . HIV disease (HCC)   . Hx of seasonal allergies   . No pertinent past medical history   . Recurrent tonsillitis   . Wisdom teeth extracted     Social History   Tobacco Use  .  Smoking status: Light Tobacco Smoker    Packs/day: 0.40    Types: Cigarettes  . Smokeless tobacco: Never Used  Vaping Use  . Vaping Use: Never used  Substance Use Topics  . Alcohol use: No    Alcohol/week: 0.0 standard drinks  . Drug use: Yes    Frequency: 7.0 times per week    Types: Marijuana    Comment: today    Family History  Problem Relation Age of Onset  . Diabetes Mother   . Asthma Father     No Known Allergies  Health Maintenance  Topic Date Due  . COVID-19 Vaccine  (1) Never done  . INFLUENZA VACCINE  05/12/2021  . TETANUS/TDAP  03/30/2028  . Zoster Vaccines- Shingrix (1 of 2) 11/13/2043  . Hepatitis C Screening  Completed  . HIV Screening  Completed  . HPV VACCINES  Aged Out    Objective:  Vitals:   03/12/21 1036  BP: 116/73  Pulse: (!) 101  Temp: 98.9 F (37.2 C)  SpO2: 97%  Weight: 144 lb (65.3 kg)   Body mass index is 19.53 kg/m.  Physical Exam Constitutional:      Comments: He is in good spirits.  Cardiovascular:     Rate and Rhythm: Normal rate and regular rhythm.     Heart sounds: No murmur heard.   Pulmonary:     Effort: Pulmonary effort is normal.     Breath sounds: Normal breath sounds.  Abdominal:     Palpations: Abdomen is soft.     Tenderness: There is no abdominal tenderness.  Genitourinary:    Comments: He has an open surgical wound at the base of his scrotum on the left side that is clean and pink.  There is a small amount of thin bloody drainage on the gauze dressing. Musculoskeletal:        General: No swelling or tenderness.  Skin:    Findings: No rash.  Psychiatric:        Mood and Affect: Mood normal.     Lab Results Lab Results  Component Value Date   WBC 7.1 03/08/2021   HGB 9.4 (L) 03/08/2021   HCT 31.7 (L) 03/08/2021   MCV 75.7 (L) 03/08/2021   PLT 406 (H) 03/08/2021    Lab Results  Component Value Date   CREATININE 1.70 (H) 03/09/2021   BUN 6 03/08/2021   NA 139 03/08/2021   K 4.0 03/08/2021   CL 103 03/08/2021   CO2 30 03/08/2021    Lab Results  Component Value Date   ALT 9 03/05/2021   AST 12 (L) 03/05/2021   ALKPHOS 62 03/05/2021   BILITOT 0.4 03/05/2021    Lab Results  Component Value Date   CHOL 125 02/28/2019   HDL 38 (L) 02/28/2019   LDLCALC 73 02/28/2019   TRIG 55 02/28/2019   CHOLHDL 3.3 02/28/2019   Lab Results  Component Value Date   LABRPR Reactive (A) 03/09/2021   RPRTITER 1:4 (H) 02/28/2019   HIV 1 RNA Quant (copies/mL)  Date Value  03/09/2021 580   09/21/2019 7,370 (H)  02/28/2019 15,400 (H)   CD4 T Cell Abs (/uL)  Date Value  03/05/2020 325 (L)  09/21/2019 390 (L)  02/28/2019 318 (L)     Problem List Items Addressed This Visit      High   HIV disease (HCC)    Somewhat surprisingly his viral load is fairly low even though he had not been taking his Biktarvy.  His ADAP  has been approved.  I will check a CD4 count today and see him back in 4 weeks.      Relevant Medications   bictegravir-emtricitabine-tenofovir AF (BIKTARVY) 50-200-25 MG TABS tablet     Unprioritized   Proctitis    I will check urine and rectal swabs for GC and chlamydia.  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      Perineal abscess    His abscess is resolving following surgery and antibiotic therapy.  He has follow-up with urology in 2 weeks.      Relevant Orders   Urine cytology ancillary only   Cytology (oral, anal, urethral) ancillary only   Basic metabolic panel   T-helper cell (CD4)- ([facility] clinic only)   HIV-1 RNA quant-no reflex-bld    Other Visit Diagnoses    STI (sexually transmitted infection)    -  Primary   Relevant Medications   penicillin g benzathine (BICILLIN LA) 1200000 UNIT/2ML injection 2.4 Million Units (Start on 03/12/2021 11:30 AM)   bictegravir-emtricitabine-tenofovir AF (BIKTARVY) 50-200-25 MG TABS tablet        Cliffton Asters, MD [facility] [phone] pager   [phone] cell 03/12/2021, 11:28 AM

## 2021-03-13 LAB — CYTOLOGY, (ORAL, ANAL, URETHRAL) ANCILLARY ONLY
Chlamydia: NEGATIVE
Comment: NEGATIVE
Comment: NORMAL
Neisseria Gonorrhea: NEGATIVE

## 2021-03-13 LAB — URINE CYTOLOGY ANCILLARY ONLY
Chlamydia: NEGATIVE
Comment: NEGATIVE
Comment: NORMAL
Neisseria Gonorrhea: NEGATIVE

## 2021-03-13 LAB — T-HELPER CELL (CD4) - (RCID CLINIC ONLY)
CD4 % Helper T Cell: 18 % — ABNORMAL LOW (ref 33–65)
CD4 T Cell Abs: 292 /uL — ABNORMAL LOW (ref 400–1790)

## 2021-03-14 LAB — BASIC METABOLIC PANEL
BUN/Creatinine Ratio: 7 (calc) (ref 6–22)
BUN: 10 mg/dL (ref 7–25)
CO2: 27 mmol/L (ref 20–32)
Calcium: 9 mg/dL (ref 8.6–10.3)
Chloride: 105 mmol/L (ref 98–110)
Creat: 1.53 mg/dL — ABNORMAL HIGH (ref 0.60–1.35)
Glucose, Bld: 92 mg/dL (ref 65–99)
Potassium: 3.9 mmol/L (ref 3.5–5.3)
Sodium: 139 mmol/L (ref 135–146)

## 2021-03-14 LAB — HIV-1 RNA QUANT-NO REFLEX-BLD
HIV 1 RNA Quant: 198 Copies/mL — ABNORMAL HIGH
HIV-1 RNA Quant, Log: 2.3 Log cps/mL — ABNORMAL HIGH

## 2021-03-19 ENCOUNTER — Telehealth: Payer: Self-pay

## 2021-03-19 ENCOUNTER — Ambulatory Visit: Payer: Self-pay

## 2021-03-19 NOTE — Telephone Encounter (Signed)
Patient called to reschedule nurse visit (2 of 3 bicillin infection) Patient accepts appointment tomorrow and the following weekly injection. Valarie Cones

## 2021-03-20 ENCOUNTER — Ambulatory Visit: Payer: Self-pay

## 2021-03-27 ENCOUNTER — Telehealth: Payer: Self-pay

## 2021-03-27 ENCOUNTER — Ambulatory Visit: Payer: Self-pay

## 2021-03-27 NOTE — Telephone Encounter (Signed)
Patient no-showed today's nurse visit. Faxed patient information to [facility] for follow up.   Sandie Ano, RN

## 2021-03-27 NOTE — Telephone Encounter (Signed)
Per chart, patient received 2.4 million units Bicillin IM on 03/12/21. He has not been back for his second injection. He has a nurse visit scheduled for today. Per Dr. Orvan Falconer, it's okay to give patient second dose of Bicillin today and scheduled third dose one week out. Dr. Orvan Falconer will monitor patient's RPR titer to determine response to treatment.   Called patient to confirm appointment, no answer. Left HIPAA compliant voicemail on self-identified inbox requesting callback.   Sandie Ano, RN

## 2021-04-09 ENCOUNTER — Ambulatory Visit: Payer: Self-pay | Admitting: Internal Medicine

## 2021-12-13 ENCOUNTER — Other Ambulatory Visit: Payer: Self-pay

## 2021-12-13 ENCOUNTER — Emergency Department (HOSPITAL_COMMUNITY)
Admission: EM | Admit: 2021-12-13 | Discharge: 2021-12-14 | Disposition: A | Discharge status: Home / Self Care | Payer: Self-pay | Attending: Emergency Medicine | Admitting: Emergency Medicine

## 2021-12-13 ENCOUNTER — Encounter (HOSPITAL_COMMUNITY): Payer: Self-pay

## 2021-12-13 DIAGNOSIS — Z20822 Contact with and (suspected) exposure to covid-19: Secondary | ICD-10-CM | POA: Insufficient documentation

## 2021-12-13 DIAGNOSIS — Z21 Asymptomatic human immunodeficiency virus [HIV] infection status: Secondary | ICD-10-CM | POA: Insufficient documentation

## 2021-12-13 DIAGNOSIS — R000 Tachycardia, unspecified: Secondary | ICD-10-CM | POA: Insufficient documentation

## 2021-12-13 DIAGNOSIS — N200 Calculus of kidney: Secondary | ICD-10-CM | POA: Insufficient documentation

## 2021-12-13 DIAGNOSIS — R109 Unspecified abdominal pain: Secondary | ICD-10-CM

## 2021-12-13 DIAGNOSIS — D72829 Elevated white blood cell count, unspecified: Secondary | ICD-10-CM | POA: Insufficient documentation

## 2021-12-13 LAB — CBC
HCT: 38.5 % — ABNORMAL LOW (ref 39.0–52.0)
Hemoglobin: 12.3 g/dL — ABNORMAL LOW (ref 13.0–17.0)
MCH: 25.7 pg — ABNORMAL LOW (ref 26.0–34.0)
MCHC: 31.9 g/dL (ref 30.0–36.0)
MCV: 80.4 fL (ref 80.0–100.0)
Platelets: 209 10*3/uL (ref 150–400)
RBC: 4.79 MIL/uL (ref 4.22–5.81)
RDW: 13.1 % (ref 11.5–15.5)
WBC: 10.9 10*3/uL — ABNORMAL HIGH (ref 4.0–10.5)
nRBC: 0 % (ref 0.0–0.2)

## 2021-12-13 LAB — URINALYSIS, ROUTINE W REFLEX MICROSCOPIC
Bacteria, UA: NONE SEEN
Bilirubin Urine: NEGATIVE
Glucose, UA: NEGATIVE mg/dL
Ketones, ur: NEGATIVE mg/dL
Nitrite: NEGATIVE
Protein, ur: >=300 mg/dL — AB
RBC / HPF: >50 RBC/hpf — ABNORMAL HIGH (ref 0–5)
Specific Gravity, Urine: 1.029 (ref 1.005–1.030)
WBC, UA: >50 WBC/hpf — ABNORMAL HIGH (ref 0–5)
pH: 6 (ref 5.0–8.0)

## 2021-12-13 LAB — COMPREHENSIVE METABOLIC PANEL
ALT: 15 U/L (ref 0–44)
AST: 25 U/L (ref 15–41)
Albumin: 3.4 g/dL — ABNORMAL LOW (ref 3.5–5.0)
Alkaline Phosphatase: 68 U/L (ref 38–126)
Anion gap: 9 (ref 5–15)
BUN: 12 mg/dL (ref 6–20)
CO2: 26 mmol/L (ref 22–32)
Calcium: 8.6 mg/dL — ABNORMAL LOW (ref 8.9–10.3)
Chloride: 98 mmol/L (ref 98–111)
Creatinine, Ser: 0.91 mg/dL (ref 0.61–1.24)
GFR, Estimated: >60 mL/min (ref >60)
Glucose, Bld: 88 mg/dL (ref 70–99)
Potassium: 3.4 mmol/L — ABNORMAL LOW (ref 3.5–5.1)
Sodium: 133 mmol/L — ABNORMAL LOW (ref 135–145)
Total Bilirubin: 0.1 mg/dL — ABNORMAL LOW (ref 0.3–1.2)
Total Protein: 9.3 g/dL — ABNORMAL HIGH (ref 6.5–8.1)

## 2021-12-13 LAB — LIPASE, BLOOD: Lipase: 37 U/L (ref 11–51)

## 2021-12-13 NOTE — ED Triage Notes (Signed)
Left lower abdominal pain x2 days. Hurts worse when he stands.   ?

## 2021-12-14 ENCOUNTER — Encounter (HOSPITAL_COMMUNITY): Payer: Self-pay | Admitting: Emergency Medicine

## 2021-12-14 ENCOUNTER — Emergency Department (HOSPITAL_COMMUNITY): Payer: Self-pay

## 2021-12-14 LAB — RESP PANEL BY RT-PCR (FLU A&B, COVID) ARPGX2
Influenza A by PCR: NEGATIVE
Influenza B by PCR: NEGATIVE
SARS Coronavirus 2 by RT PCR: NEGATIVE

## 2021-12-14 MED ORDER — ACETAMINOPHEN 325 MG PO TABS
650.0000 mg | ORAL_TABLET | Freq: Once | ORAL | Status: AC
  Administered 2021-12-14: 650 mg via ORAL
  Filled 2021-12-14: qty 2

## 2021-12-14 MED ORDER — LACTATED RINGERS IV BOLUS
1000.0000 mL | Freq: Once | INTRAVENOUS | Status: AC
  Administered 2021-12-14: 1000 mL via INTRAVENOUS

## 2021-12-14 MED ORDER — CEFUROXIME AXETIL 500 MG PO TABS: 500.0000 mg | ORAL_TABLET | Freq: Two times a day (BID) | ORAL | 0 refills | Status: AC

## 2021-12-14 MED ORDER — IOHEXOL 300 MG/ML  SOLN: 100.0000 mL | Freq: Once | INTRAMUSCULAR | Status: DC | PRN

## 2021-12-14 MED ORDER — MORPHINE SULFATE (PF) 4 MG/ML IV SOLN
4.0000 mg | Freq: Once | INTRAVENOUS | Status: AC
  Administered 2021-12-14: 4 mg via INTRAVENOUS
  Filled 2021-12-14: qty 1

## 2021-12-14 MED ORDER — ONDANSETRON HCL 4 MG/2ML IJ SOLN
4.0000 mg | Freq: Once | INTRAMUSCULAR | Status: AC
  Administered 2021-12-14: 4 mg via INTRAVENOUS
  Filled 2021-12-14: qty 2

## 2021-12-14 MED ORDER — OXYCODONE HCL 5 MG PO TABS: 5.0000 mg | ORAL_TABLET | ORAL | 0 refills | Status: DC | PRN

## 2021-12-14 NOTE — ED Provider Notes (Addendum)
[facility] EMERGENCY DEPARTMENT Provider Note   CSN: [id number] Arrival date & time: 12/13/21  2204     History  Chief Complaint  Patient presents with   Abdominal Pain    Julian Sanchez is a 26 y.o. male.  This is a 26 y.o. male  with significant medical history as below, including HIV, nephrolithiasis, ADHD who presents to the ED with complaint of left flank/LLQ pain. Onset 2 days ago, left back/flank; has since progressed to LLQ.  Pain is constant.  Mildly improved with sitting still and not moving.  Worse when he moves or stands up, walks. Some nausea w/o emesis, reduced PO intake last 24 hours, drinking liquids appropriately, feels more thirsty than normal, fever earlier in the day, chills.  No testicle or scrotal pain or swelling.  Mild dysuria.  Taking HIV medications as prescribed   Uses marijuana, no EtOH or illicit drug use   Past Medical History: No date: ADHD (attention deficit hyperactivity disorder) No date: HIV disease ([place]) No date: Hx of seasonal allergies No date: No pertinent past medical history No date: Recurrent tonsillitis No date: Wisdom teeth extracted  Past Surgical History: 03/08/2021: BIOPSY     Comment:  Procedure: BIOPSY;  Surgeon: Ronald Lobo, MD;                Location: [facility];  Service: Endoscopy;; 03/08/2021: FLEXIBLE SIGMOIDOSCOPY; N/A     Comment:  Procedure: FLEXIBLE SIGMOIDOSCOPY;  Surgeon: Ronald Lobo, MD;  Location: [facility];  Service: Endoscopy;              Laterality: N/A; 09/28/2012: IRRIGATION AND DEBRIDEMENT ABSCESS     Comment:  Procedure: IRRIGATION AND DEBRIDEMENT ABSCESS;  Surgeon:              Jodi Marble, MD;  Location: [facility];  Service: ENT;                Laterality: Right;  Right Peritonsillar Abscess 03/05/2021: IRRIGATION AND DEBRIDEMENT ABSCESS; N/A     Comment:  Procedure: IRRIGATION AND DEBRIDEMENT ABSCESS;  Surgeon:              Lucas Mallow, MD;  Location: [facility];  Service:                Urology;  Laterality: N/A; No date: MOUTH SURGERY 03/05/2021: SCROTAL EXPLORATION; N/A     Comment:  Procedure: SCROTUM EXPLORATION;  Surgeon: Lucas Mallow, MD;  Location: [facility];  Service: Urology;                Laterality: N/A; No date: TONSILLECTOMY 07/23/2017: TONSILLECTOMY; N/A     Comment:  Procedure: TONSILLECTOMY;  Surgeon: Melida Quitter, MD;                Location: [facility];  Service: ENT;  Laterality: N/A; No date: WISDOM TOOTH EXTRACTION    The history is provided by the patient. No language interpreter was used.  Abdominal Pain Associated symptoms: dysuria and nausea   Associated symptoms: no chest pain, no chills, no cough, no fever, no hematuria, no shortness of breath and no vomiting       Home Medications Prior to Admission medications   Medication Sig Start Date End Date Taking? Authorizing Provider  cefUROXime (CEFTIN) 500 MG tablet Take 1 tablet (  500 mg total) by mouth 2 (two) times daily with a meal for 7 days. 12/14/21 12/21/21 Yes Wynona Dove A, DO  oxyCODONE (ROXICODONE) 5 MG immediate release tablet Take 1 tablet (5 mg total) by mouth every 4 (four) hours as needed for up to 6 doses for severe pain. 12/14/21  Yes Wynona Dove A, DO  bictegravir-emtricitabine-tenofovir AF (BIKTARVY) 50-200-25 MG TABS tablet Take 1 tablet by mouth daily. 03/12/21   Michel Bickers, MD  polyethylene glycol (MIRALAX / GLYCOLAX) 17 g packet Take 17 g by mouth daily as needed for mild constipation. 03/09/21   Florencia Reasons, MD      Allergies    Patient has no known allergies.    Review of Systems   Review of Systems  Constitutional:  Positive for appetite change. Negative for chills and fever.  HENT:  Negative for facial swelling and trouble swallowing.   Eyes:  Negative for photophobia and visual disturbance.  Respiratory:  Negative for cough and shortness of breath.   Cardiovascular:  Negative for chest pain and palpitations.  Gastrointestinal:  Positive  for abdominal pain and nausea. Negative for vomiting.  Endocrine: Positive for polydipsia. Negative for polyuria.  Genitourinary:  Positive for dysuria. Negative for difficulty urinating and hematuria.  Musculoskeletal:  Negative for gait problem and joint swelling.  Skin:  Negative for pallor and rash.  Neurological:  Negative for syncope and headaches.  Psychiatric/Behavioral:  Negative for agitation and confusion.    Physical Exam Updated Vital Signs BP 109/73    Pulse 95    Temp (!) 100.4 F (38 C)    Resp 16    Ht 6' (1.829 m)    Wt 65.3 kg    SpO2 98%    BMI 19.52 kg/m  Physical Exam Vitals and nursing note reviewed.  Constitutional:      General: He is not in acute distress.    Appearance: Normal appearance. He is well-developed. He is not ill-appearing, toxic-appearing or diaphoretic.  HENT:     Head: Normocephalic and atraumatic.     Right Ear: External ear normal.     Left Ear: External ear normal.     Mouth/Throat:     Mouth: Mucous membranes are moist.  Eyes:     General: No scleral icterus. Cardiovascular:     Rate and Rhythm: Regular rhythm. Tachycardia present.     Pulses: Normal pulses.     Heart sounds: Normal heart sounds.  Pulmonary:     Effort: Pulmonary effort is normal. No respiratory distress.     Breath sounds: Normal breath sounds.  Abdominal:     General: Abdomen is flat.     Palpations: Abdomen is soft.     Tenderness: There is abdominal tenderness in the left lower quadrant.  Musculoskeletal:        General: Normal range of motion.     Cervical back: Normal range of motion.     Right lower leg: No edema.     Left lower leg: No edema.  Skin:    General: Skin is warm and dry.     Capillary Refill: Capillary refill takes less than 2 seconds.  Neurological:     Mental Status: He is alert and oriented to person, place, and time.  Psychiatric:        Mood and Affect: Mood normal.        Behavior: Behavior normal.    ED Results / Procedures /  Treatments   Labs (all labs ordered are  listed, but only abnormal results are displayed) Labs Reviewed  COMPREHENSIVE METABOLIC PANEL - Abnormal; Notable for the following components:      Result Value   Sodium 133 (*)    Potassium 3.4 (*)    Calcium 8.6 (*)    Total Protein 9.3 (*)    Albumin 3.4 (*)    Total Bilirubin 0.1 (*)    All other components within normal limits  CBC - Abnormal; Notable for the following components:   WBC 10.9 (*)    Hemoglobin 12.3 (*)    HCT 38.5 (*)    MCH 25.7 (*)    All other components within normal limits  URINALYSIS, ROUTINE W REFLEX MICROSCOPIC - Abnormal; Notable for the following components:   Color, Urine AMBER (*)    APPearance CLOUDY (*)    Hgb urine dipstick LARGE (*)    Protein, ur >=300 (*)    Leukocytes,Ua MODERATE (*)    RBC / HPF >50 (*)    WBC, UA >50 (*)    Non Squamous Epithelial 0-5 (*)    All other components within normal limits  RESP PANEL BY RT-PCR (FLU A&B, COVID) ARPGX2  URINE CULTURE  LIPASE, BLOOD    EKG None  Radiology CT Renal Stone Study  Result Date: 12/14/2021 CLINICAL DATA:  Nephrolithiasis with left flank and lower quadrant pain. EXAM: CT ABDOMEN AND PELVIS WITHOUT CONTRAST TECHNIQUE: Multidetector CT imaging of the abdomen and pelvis was performed following the standard protocol without IV contrast. RADIATION DOSE REDUCTION: This exam was performed according to the departmental dose-optimization program which includes automated exposure control, adjustment of the mA and/or kV according to patient size and/or use of iterative reconstruction technique. COMPARISON:  Pelvis CT 03/04/2021, abdominopelvic CT 08/13/2018 FINDINGS: Lower chest: Clear lung bases.  Normal heart size. Hepatobiliary: No focal liver lesion on this unenhanced exam. Gallbladder physiologically distended, no calcified stone. No biliary dilatation. Pancreas: No ductal dilatation or inflammation. Spleen: Normal unenhanced appearance.  Adrenals/Urinary Tract: No adrenal nodule. Horseshoe kidney configuration. 6 mm stone versus 2 adjacent stones in the mid left kidney. No hydronephrosis or perinephric edema. No ureteral stones. Minimally distended urinary bladder, no bladder stone or wall thickening. Stomach/Bowel: Detailed bowel assessment is limited in the absence of contrast and paucity of intra-abdominal fat. Unremarkable stomach. No small bowel obstruction or inflammatory change. The appendix is not identified, no evidence of appendicitis. Small colonic stool burden without colonic inflammation. Slight rectal wall thickening, improved from prior pelvic CT. Vascular/Lymphatic: Normal caliber abdominal aorta. No portal venous or mesenteric gas. There prominent bilateral inguinal and external iliac nodes, typically reactive and/or related to HIV. Multiple small mesenteric and retroperitoneal nodes. Reproductive: Prostate is unremarkable. Other: No ascites.  No free air.  No abdominopelvic collection. Musculoskeletal: There are no acute or suspicious osseous abnormalities. Stable peripherally sclerotic lucency in the left iliac bone. Stable sclerotic density in the right iliac bone. Stable multiple bone islands. IMPRESSION: 1. 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 No hydronephrosis or obstructive uropathy. 2. Slight rectal wall thickening, improved from prior pelvic CT. Electronically Signed   By: Keith Rake M.D.   On: 12/14/2021 01:37    Procedures Procedures    Medications Ordered in ED Medications  iohexol (OMNIPAQUE) 300 MG/ML solution 100 mL (has no administration in time range)  lactated ringers bolus 1,000 mL (0 mLs Intravenous Stopped 12/14/21 0235)  morphine (PF) 4 MG/ML injection 4 mg (4 mg Intravenous  Given 12/14/21 0135)  ondansetron (ZOFRAN) injection 4 mg (4 mg Intravenous Given 12/14/21 0133)  acetaminophen (TYLENOL) tablet 650 mg (650 mg Oral Given 12/14/21 0141)     ED Course/ Medical Decision Making/ A&P                           Medical Decision Making Amount and/or Complexity of Data Reviewed Labs: ordered. Radiology: ordered.  Risk OTC drugs. Prescription drug management.   Initial Impression and Ddx Serious etiology was considered, differential includes was not limited to UTI, pyelonephritis, diverticulitis, nephrolithiasis, viral syndrome  Patient PMH that increases complexity of ED encounter: HIV with variable compliance to antivirals  Interpretation of Diagnostics I personally reviewed the Cardiac Monitor and my interpretation is as follows: NSR  CT imaging was reviewed, I agree with radiology interpretation.  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  No bacteria was seen.  CBC with mild leukocytosis of 10.9.  Patient does have some urinary complaints.  I will go ahead and treat with antibiotics, send urine culture.  Low threshold to de-escalate antibiotics if culture returns negative.  Patient Reassessment and Ultimate Disposition/Management Patient reassessed, reports his pain has resolved.  He is tolerant p.o. intake without difficulty, no nausea or vomiting.  I will go ahead and treat for possible UTI.  I favor patient has recently passed a kidney stone in conjunction with possible viral syndrome.  Have patient follow-up PCP.  He has had kidney stone in the past. Advise him to see PCP in the office, strict return precautions were discussed.   Patient management required discussion with the following services or consulting groups:  None  Complexity of Problems Addressed Acute illness or injury that poses threat of life of bodily function  Additional Data Reviewed and Analyzed Further history obtained from: Past medical history and medications listed in the EMR, Prior ED visit notes, Recent discharge  summary, Recent Consult notes, and Prior labs/imaging results  Patient Encounter Risk Assessment Prescriptions and Consideration of hospitalization   The patient's overall condition has improved, the patient presents with abdominal pain without signs of peritonitis, or other life-threatening serious etiology. The patient understands that at this time there is no evidence for a more malignant underlying process, but the patient also understands that early in the process of an illness, an emergency department workup can be falsely reassuring. Detailed discussions were had with the patient regarding current findings, and need for close f/u with PCP or on call doctor. The patient appears stable for discharge and has been instructed to return immediately if the symptoms worsen in any way for re-evaluation. Patient verbalized understanding and is in agreement with current care plan.  All questions answered prior to discharge.        Final Clinical Impression(s) / ED Diagnoses Final diagnoses:  Nephrolithiasis  Abdominal pain, unspecified abdominal location    Rx / DC Orders ED Discharge Orders          Ordered    oxyCODONE (ROXICODONE) 5 MG immediate release tablet  Every 4 hours PRN        12/14/21 0344    cefUROXime (CEFTIN) 500 MG tablet  2 times daily with meals        12/14/21 0344              Jeanell Sparrow, DO  12/14/21 0345    Jeanell Sparrow, DO 12/14/21 [phone]

## 2021-12-14 NOTE — Discharge Instructions (Addendum)
It was a pleasure caring for you today in the emergency department. ° °Please return to the emergency department for any worsening or worrisome symptoms. ° ° °

## 2021-12-17 LAB — URINE CULTURE: Culture: 10000 — AB

## 2022-05-19 ENCOUNTER — Emergency Department (HOSPITAL_COMMUNITY)
Admission: EM | Admit: 2022-05-19 | Discharge: 2022-05-19 | Disposition: A | Discharge status: Home / Self Care | Payer: Self-pay | Attending: Emergency Medicine | Admitting: Emergency Medicine

## 2022-05-19 ENCOUNTER — Encounter (HOSPITAL_COMMUNITY): Payer: Self-pay | Admitting: Emergency Medicine

## 2022-05-19 ENCOUNTER — Other Ambulatory Visit: Payer: Self-pay

## 2022-05-19 DIAGNOSIS — H1031 Unspecified acute conjunctivitis, right eye: Secondary | ICD-10-CM

## 2022-05-19 DIAGNOSIS — H1089 Other conjunctivitis: Secondary | ICD-10-CM | POA: Insufficient documentation

## 2022-05-19 MED ORDER — ERYTHROMYCIN 5 MG/GM OP OINT
TOPICAL_OINTMENT | Freq: Once | OPHTHALMIC | Status: AC
  Filled 2022-05-19: qty 3.5

## 2022-05-19 MED ORDER — ERYTHROMYCIN 5 MG/GM OP OINT: TOPICAL_OINTMENT | OPHTHALMIC | 0 refills | Status: DC

## 2022-05-19 NOTE — ED Provider Notes (Signed)
[facility] EMERGENCY DEPARTMENT Provider Note   CSN: [id number] Arrival date & time: 05/19/22  1057     History  Chief Complaint  Patient presents with   Eye Drainage    Julian Sanchez is a 26 y.o. male.  HPI  Patient presents with left eye pain and drainage.  The started 3 to 4 days ago, denies any blurry vision.  He is having clear drainage and feels like his eyes are stuck together when he wakes up in the morning.  Does not wear contacts or glasses, denies any fevers, cough, additional systemic symptoms.  Nobody at his home is sick that he is aware of.  Home Medications Prior to Admission medications   Medication Sig Start Date End Date Taking? Authorizing Provider  bictegravir-emtricitabine-tenofovir AF (BIKTARVY) 50-200-25 MG TABS tablet Take 1 tablet by mouth daily. 03/12/21   Cliffton Asters, MD  oxyCODONE (ROXICODONE) 5 MG immediate release tablet Take 1 tablet (5 mg total) by mouth every 4 (four) hours as needed for up to 6 doses for severe pain. 12/14/21   Sloan Leiter, DO  polyethylene glycol (MIRALAX / GLYCOLAX) 17 g packet Take 17 g by mouth daily as needed for mild constipation. 03/09/21   Albertine Grates, MD      Allergies    Patient has no known allergies.    Review of Systems   Review of Systems  Physical Exam Updated Vital Signs BP 122/74 (BP Location: Right Arm)   Pulse 71   Temp 98.2 F (36.8 C) (Oral)   Resp 20   Ht 6' (1.829 m)   Wt 66.2 kg   SpO2 99%   BMI 19.80 kg/m  Physical Exam Vitals and nursing note reviewed. Exam conducted with a chaperone present.  Constitutional:      General: He is not in acute distress.    Appearance: Normal appearance.  HENT:     Head: Normocephalic and atraumatic.  Eyes:     General: No scleral icterus.       Left eye: Discharge present.    Extraocular Movements: Extraocular movements intact.     Pupils: Pupils are equal, round, and reactive to light.     Comments: EOMI, Clear drainage to left eye.  No upper or lower  lid edema, pupil is reactive and peripheral fields are intact   Skin:    Coloration: Skin is not jaundiced.  Neurological:     Mental Status: He is alert. Mental status is at baseline.     Coordination: Coordination normal.     ED Results / Procedures / Treatments   Labs (all labs ordered are listed, but only abnormal results are displayed) Labs Reviewed - No data to display  EKG None  Radiology No results found.  Procedures Procedures    Medications Ordered in ED Medications  erythromycin ophthalmic ointment (has no administration in time range)    ED Course/ Medical Decision Making/ A&P                           Medical Decision Making Risk Prescription drug management.   Patient presents due to left eye pain and draining.  Physical exam is reassuring, visual acuity 20/20 bilaterally in each eye individually.  Pupils reactive, there is no pain with EOM.  No surrounding orbital swelling.  Considered orbital cellulitis or preseptal cellulitis but does not appear consistent with exam.  Similar symptoms to more consistent bacterial versus viral conjunctivitis.  Will  cover with topical antibiotic and provide ophthal follow-up.        Final Clinical Impression(s) / ED Diagnoses Final diagnoses:  None    Rx / DC Orders ED Discharge Orders     None         Theron Arista, PA-C 05/19/22 1812    Gloris Manchester, MD 05/21/22 1410

## 2022-05-19 NOTE — Discharge Instructions (Addendum)
You were seen in the emergency department for eye pain.  Topical antibiotic given, please apply this to your lower eyelid twice a day for the next week.  Additional ointment was sent to your pharmacy if you run out of the sample given in the emergency department.  If you have loss of vision, significantly worsening swelling around the eye return back to emergency department for evaluation.  If no improvement see ophthalmology at the end of the week.Marland Kitchen

## 2022-05-19 NOTE — ED Triage Notes (Signed)
Pt presents with left eye pain and drainage x 4 days.

## 2022-06-09 ENCOUNTER — Observation Stay (HOSPITAL_COMMUNITY): Payer: Self-pay

## 2022-06-09 ENCOUNTER — Encounter (HOSPITAL_COMMUNITY): Payer: Self-pay | Admitting: Internal Medicine

## 2022-06-09 ENCOUNTER — Emergency Department (HOSPITAL_COMMUNITY): Payer: Self-pay

## 2022-06-09 ENCOUNTER — Observation Stay (HOSPITAL_COMMUNITY): Payer: Self-pay | Admitting: Certified Registered Nurse Anesthetist

## 2022-06-09 ENCOUNTER — Encounter (HOSPITAL_COMMUNITY)
Admission: EM | Disposition: A | Discharge status: Home / Self Care | Payer: Self-pay | Source: Home / Self Care | Attending: Internal Medicine

## 2022-06-09 ENCOUNTER — Other Ambulatory Visit: Payer: Self-pay

## 2022-06-09 ENCOUNTER — Inpatient Hospital Stay (HOSPITAL_COMMUNITY)
Admission: EM | Admit: 2022-06-09 | Discharge: 2022-06-11 | DRG: 717 | Disposition: A | Discharge status: Home / Self Care | Payer: Self-pay | Attending: Internal Medicine | Admitting: Internal Medicine

## 2022-06-09 DIAGNOSIS — F1290 Cannabis use, unspecified, uncomplicated: Secondary | ICD-10-CM | POA: Diagnosis present

## 2022-06-09 DIAGNOSIS — E876 Hypokalemia: Secondary | ICD-10-CM | POA: Diagnosis present

## 2022-06-09 DIAGNOSIS — E871 Hypo-osmolality and hyponatremia: Secondary | ICD-10-CM | POA: Diagnosis not present

## 2022-06-09 DIAGNOSIS — F17210 Nicotine dependence, cigarettes, uncomplicated: Secondary | ICD-10-CM

## 2022-06-09 DIAGNOSIS — Z20822 Contact with and (suspected) exposure to covid-19: Secondary | ICD-10-CM | POA: Diagnosis present

## 2022-06-09 DIAGNOSIS — E860 Dehydration: Secondary | ICD-10-CM | POA: Diagnosis present

## 2022-06-09 DIAGNOSIS — N492 Inflammatory disorders of scrotum: Principal | ICD-10-CM | POA: Diagnosis present

## 2022-06-09 DIAGNOSIS — Z79899 Other long term (current) drug therapy: Secondary | ICD-10-CM

## 2022-06-09 DIAGNOSIS — Z825 Family history of asthma and other chronic lower respiratory diseases: Secondary | ICD-10-CM

## 2022-06-09 DIAGNOSIS — K219 Gastro-esophageal reflux disease without esophagitis: Secondary | ICD-10-CM | POA: Diagnosis present

## 2022-06-09 DIAGNOSIS — L0292 Furuncle, unspecified: Secondary | ICD-10-CM | POA: Diagnosis present

## 2022-06-09 DIAGNOSIS — Z833 Family history of diabetes mellitus: Secondary | ICD-10-CM

## 2022-06-09 DIAGNOSIS — B20 Human immunodeficiency virus [HIV] disease: Secondary | ICD-10-CM | POA: Diagnosis present

## 2022-06-09 DIAGNOSIS — N200 Calculus of kidney: Secondary | ICD-10-CM | POA: Diagnosis present

## 2022-06-09 DIAGNOSIS — F909 Attention-deficit hyperactivity disorder, unspecified type: Secondary | ICD-10-CM | POA: Diagnosis present

## 2022-06-09 HISTORY — PX: INCISION AND DRAINAGE ABSCESS: SHX5864

## 2022-06-09 LAB — COMPREHENSIVE METABOLIC PANEL
ALT: 10 U/L (ref 0–44)
AST: 20 U/L (ref 15–41)
Albumin: 3.7 g/dL (ref 3.5–5.0)
Alkaline Phosphatase: 64 U/L (ref 38–126)
Anion gap: 9 (ref 5–15)
BUN: 12 mg/dL (ref 6–20)
CO2: 20 mmol/L — ABNORMAL LOW (ref 22–32)
Calcium: 8.8 mg/dL — ABNORMAL LOW (ref 8.9–10.3)
Chloride: 103 mmol/L (ref 98–111)
Creatinine, Ser: 0.91 mg/dL (ref 0.61–1.24)
GFR, Estimated: >60 mL/min (ref >60)
Glucose, Bld: 128 mg/dL — ABNORMAL HIGH (ref 70–99)
Potassium: 3 mmol/L — ABNORMAL LOW (ref 3.5–5.1)
Sodium: 132 mmol/L — ABNORMAL LOW (ref 135–145)
Total Bilirubin: 0.8 mg/dL (ref 0.3–1.2)
Total Protein: 8.8 g/dL — ABNORMAL HIGH (ref 6.5–8.1)

## 2022-06-09 LAB — RESP PANEL BY RT-PCR (FLU A&B, COVID) ARPGX2
Influenza A by PCR: NEGATIVE
Influenza B by PCR: NEGATIVE
SARS Coronavirus 2 by RT PCR: NEGATIVE

## 2022-06-09 LAB — CBC
HCT: 37.3 % — ABNORMAL LOW (ref 39.0–52.0)
Hemoglobin: 12.5 g/dL — ABNORMAL LOW (ref 13.0–17.0)
MCH: 26.1 pg (ref 26.0–34.0)
MCHC: 33.5 g/dL (ref 30.0–36.0)
MCV: 77.9 fL — ABNORMAL LOW (ref 80.0–100.0)
Platelets: 203 10*3/uL (ref 150–400)
RBC: 4.79 MIL/uL (ref 4.22–5.81)
RDW: 12.4 % (ref 11.5–15.5)
WBC: 12.8 10*3/uL — ABNORMAL HIGH (ref 4.0–10.5)
nRBC: 0 % (ref 0.0–0.2)

## 2022-06-09 LAB — LACTIC ACID, PLASMA: Lactic Acid, Venous: 0.7 mmol/L (ref 0.5–1.9)

## 2022-06-09 LAB — LIPASE, BLOOD: Lipase: 28 U/L (ref 11–51)

## 2022-06-09 LAB — PROTIME-INR
INR: 1.2 (ref 0.8–1.2)
Prothrombin Time: 14.6 seconds (ref 11.4–15.2)

## 2022-06-09 LAB — APTT: aPTT: 33 seconds (ref 24–36)

## 2022-06-09 SURGERY — INCISION AND DRAINAGE, ABSCESS
Anesthesia: General | Site: Scrotum | Laterality: Right

## 2022-06-09 MED ORDER — LIDOCAINE HCL (PF) 2 % IJ SOLN
INTRAMUSCULAR | Status: AC
  Filled 2022-06-09: qty 5

## 2022-06-09 MED ORDER — FENTANYL CITRATE PF 50 MCG/ML IJ SOSY: 25.0000 ug | PREFILLED_SYRINGE | INTRAMUSCULAR | Status: DC | PRN

## 2022-06-09 MED ORDER — BUPIVACAINE HCL 0.25 % IJ SOLN
INTRAMUSCULAR | Status: DC | PRN
  Administered 2022-06-09: 5 mL

## 2022-06-09 MED ORDER — ONDANSETRON HCL 4 MG/2ML IJ SOLN
INTRAMUSCULAR | Status: AC
  Filled 2022-06-09: qty 4

## 2022-06-09 MED ORDER — ONDANSETRON HCL 4 MG/2ML IJ SOLN: 4.0000 mg | Freq: Four times a day (QID) | INTRAMUSCULAR | Status: DC | PRN

## 2022-06-09 MED ORDER — POTASSIUM CHLORIDE 20 MEQ PO PACK
40.0000 meq | PACK | Freq: Once | ORAL | Status: AC
  Administered 2022-06-09: 40 meq via ORAL
  Filled 2022-06-09: qty 2

## 2022-06-09 MED ORDER — LACTATED RINGERS IV SOLN: INTRAVENOUS | Status: DC | PRN

## 2022-06-09 MED ORDER — FENTANYL CITRATE PF 50 MCG/ML IJ SOSY
50.0000 ug | PREFILLED_SYRINGE | Freq: Once | INTRAMUSCULAR | Status: AC
  Administered 2022-06-09: 50 ug via INTRAVENOUS
  Filled 2022-06-09: qty 1

## 2022-06-09 MED ORDER — SODIUM CHLORIDE 0.9% FLUSH
3.0000 mL | Freq: Two times a day (BID) | INTRAVENOUS | Status: DC
  Administered 2022-06-09 – 2022-06-10 (×3): 3 mL via INTRAVENOUS

## 2022-06-09 MED ORDER — 0.9 % SODIUM CHLORIDE (POUR BTL) OPTIME
TOPICAL | Status: DC | PRN
  Administered 2022-06-09: 1000 mL

## 2022-06-09 MED ORDER — LIDOCAINE 2% (20 MG/ML) 5 ML SYRINGE
INTRAMUSCULAR | Status: DC | PRN
  Administered 2022-06-09: 60 mg via INTRAVENOUS

## 2022-06-09 MED ORDER — ONDANSETRON HCL 4 MG PO TABS: 4.0000 mg | ORAL_TABLET | Freq: Four times a day (QID) | ORAL | Status: DC | PRN

## 2022-06-09 MED ORDER — FENTANYL CITRATE (PF) 100 MCG/2ML IJ SOLN
INTRAMUSCULAR | Status: AC
  Filled 2022-06-09: qty 2

## 2022-06-09 MED ORDER — MIDAZOLAM HCL 2 MG/2ML IJ SOLN
INTRAMUSCULAR | Status: AC
  Filled 2022-06-09: qty 2

## 2022-06-09 MED ORDER — SODIUM CHLORIDE 0.9 % IV SOLN: 250.0000 mL | INTRAVENOUS | Status: DC | PRN

## 2022-06-09 MED ORDER — SUCCINYLCHOLINE CHLORIDE 200 MG/10ML IV SOSY
PREFILLED_SYRINGE | INTRAVENOUS | Status: AC
  Filled 2022-06-09: qty 10

## 2022-06-09 MED ORDER — OXYCODONE HCL 5 MG PO TABS
5.0000 mg | ORAL_TABLET | ORAL | Status: DC | PRN
  Administered 2022-06-09 – 2022-06-10 (×4): 5 mg via ORAL
  Filled 2022-06-09 (×4): qty 1

## 2022-06-09 MED ORDER — PROPOFOL 10 MG/ML IV BOLUS
INTRAVENOUS | Status: DC | PRN
  Administered 2022-06-09: 150 mg via INTRAVENOUS

## 2022-06-09 MED ORDER — POTASSIUM CHLORIDE CRYS ER 20 MEQ PO TBCR: 40.0000 meq | EXTENDED_RELEASE_TABLET | Freq: Once | ORAL | Status: DC

## 2022-06-09 MED ORDER — ENOXAPARIN SODIUM 40 MG/0.4ML IJ SOSY
40.0000 mg | PREFILLED_SYRINGE | INTRAMUSCULAR | Status: DC
  Filled 2022-06-09: qty 0.4

## 2022-06-09 MED ORDER — VANCOMYCIN HCL 1250 MG/250ML IV SOLN
1250.0000 mg | Freq: Two times a day (BID) | INTRAVENOUS | Status: DC
  Administered 2022-06-09 – 2022-06-10 (×2): 1250 mg via INTRAVENOUS
  Filled 2022-06-09 (×2): qty 250

## 2022-06-09 MED ORDER — FENTANYL CITRATE (PF) 100 MCG/2ML IJ SOLN
INTRAMUSCULAR | Status: DC | PRN
Start: 2022-06-09 — End: 2022-06-09
  Administered 2022-06-09: 100 ug via INTRAVENOUS

## 2022-06-09 MED ORDER — BUPIVACAINE HCL (PF) 0.25 % IJ SOLN
INTRAMUSCULAR | Status: AC
  Filled 2022-06-09: qty 30

## 2022-06-09 MED ORDER — SUCCINYLCHOLINE CHLORIDE 200 MG/10ML IV SOSY
PREFILLED_SYRINGE | INTRAVENOUS | Status: DC | PRN
  Administered 2022-06-09: 140 mg via INTRAVENOUS

## 2022-06-09 MED ORDER — ACETAMINOPHEN 325 MG PO TABS
650.0000 mg | ORAL_TABLET | Freq: Four times a day (QID) | ORAL | Status: DC | PRN
  Administered 2022-06-09: 650 mg via ORAL
  Filled 2022-06-09: qty 2

## 2022-06-09 MED ORDER — SODIUM CHLORIDE 0.9 % IV SOLN
2.0000 g | Freq: Once | INTRAVENOUS | Status: AC
  Administered 2022-06-09: 2 g via INTRAVENOUS
  Filled 2022-06-09: qty 20

## 2022-06-09 MED ORDER — ACETAMINOPHEN 650 MG RE SUPP: 650.0000 mg | Freq: Four times a day (QID) | RECTAL | Status: DC | PRN

## 2022-06-09 MED ORDER — LACTATED RINGERS IV BOLUS
1000.0000 mL | Freq: Once | INTRAVENOUS | Status: AC
  Administered 2022-06-09: 1000 mL via INTRAVENOUS

## 2022-06-09 MED ORDER — SODIUM CHLORIDE 0.9% FLUSH
3.0000 mL | INTRAVENOUS | Status: DC | PRN
Start: 2022-06-09 — End: 2022-06-11
  Administered 2022-06-10: 3 mL via INTRAVENOUS

## 2022-06-09 MED ORDER — EPHEDRINE 5 MG/ML INJ
INTRAVENOUS | Status: AC
  Filled 2022-06-09: qty 5

## 2022-06-09 MED ORDER — PHENYLEPHRINE 80 MCG/ML (10ML) SYRINGE FOR IV PUSH (FOR BLOOD PRESSURE SUPPORT)
PREFILLED_SYRINGE | INTRAVENOUS | Status: AC
  Filled 2022-06-09: qty 10

## 2022-06-09 MED ORDER — VANCOMYCIN HCL 1250 MG/250ML IV SOLN
1250.0000 mg | Freq: Once | INTRAVENOUS | Status: AC
  Administered 2022-06-09: 1250 mg via INTRAVENOUS
  Filled 2022-06-09: qty 250

## 2022-06-09 MED ORDER — IOHEXOL 300 MG/ML  SOLN
100.0000 mL | Freq: Once | INTRAMUSCULAR | Status: AC | PRN
  Administered 2022-06-09: 100 mL via INTRAVENOUS

## 2022-06-09 MED ORDER — ACETAMINOPHEN 500 MG PO TABS
1000.0000 mg | ORAL_TABLET | Freq: Once | ORAL | Status: AC
  Administered 2022-06-09: 1000 mg via ORAL
  Filled 2022-06-09: qty 2

## 2022-06-09 MED ORDER — ONDANSETRON HCL 4 MG/2ML IJ SOLN
4.0000 mg | Freq: Once | INTRAMUSCULAR | Status: AC
Start: 2022-06-09 — End: 2022-06-09
  Administered 2022-06-09: 4 mg via INTRAVENOUS
  Filled 2022-06-09: qty 2

## 2022-06-09 MED ORDER — ONDANSETRON HCL 4 MG/2ML IJ SOLN: 4.0000 mg | Freq: Once | INTRAMUSCULAR | Status: DC | PRN

## 2022-06-09 MED ORDER — ONDANSETRON HCL 4 MG/2ML IJ SOLN
INTRAMUSCULAR | Status: DC | PRN
  Administered 2022-06-09: 4 mg via INTRAVENOUS

## 2022-06-09 MED ORDER — VANCOMYCIN HCL IN DEXTROSE 1-5 GM/200ML-% IV SOLN: 1000.0000 mg | Freq: Once | INTRAVENOUS | Status: DC

## 2022-06-09 MED ORDER — FENTANYL CITRATE (PF) 250 MCG/5ML IJ SOLN
INTRAMUSCULAR | Status: AC
  Filled 2022-06-09: qty 5

## 2022-06-09 MED ORDER — BICTEGRAVIR-EMTRICITAB-TENOFOV 50-200-25 MG PO TABS
1.0000 | ORAL_TABLET | Freq: Every day | ORAL | Status: DC
Start: 2022-06-10 — End: 2022-06-11
  Administered 2022-06-10 – 2022-06-11 (×2): 1 via ORAL
  Filled 2022-06-09 (×3): qty 1

## 2022-06-09 SURGICAL SUPPLY — 25 items
BNDG GAUZE DERMACEA FLUFF 4 (GAUZE/BANDAGES/DRESSINGS) IMPLANT
COVER SURGICAL LIGHT HANDLE (MISCELLANEOUS) ×2 IMPLANT
ELECT REM PT RETURN 9FT ADLT (ELECTROSURGICAL) ×1
ELECTRODE REM PT RTRN 9FT ADLT (ELECTROSURGICAL) ×1 IMPLANT
GAUZE SPONGE 4X4 12PLY STRL (GAUZE/BANDAGES/DRESSINGS) ×1 IMPLANT
GLOVE BIO SURGEON STRL SZ8 (GLOVE) ×1 IMPLANT
GLOVE BIOGEL PI IND STRL 7.0 (GLOVE) ×2 IMPLANT
GLOVE BIOGEL PI IND STRL 8 (GLOVE) ×1 IMPLANT
GLOVE BIOGEL PI INDICATOR 7.0 (GLOVE) ×2
GLOVE BIOGEL PI INDICATOR 8 (GLOVE) ×1
GLOVE SURG SS PI 7.0 STRL IVOR (GLOVE) IMPLANT
GOWN STRL REUS W/TWL LRG LVL3 (GOWN DISPOSABLE) ×1 IMPLANT
GOWN STRL REUS W/TWL XL LVL3 (GOWN DISPOSABLE) ×1 IMPLANT
KIT TURNOVER KIT A (KITS) ×1 IMPLANT
MANIFOLD NEPTUNE II (INSTRUMENTS) ×1 IMPLANT
NDL HYPO 25X1 1.5 SAFETY (NEEDLE) ×1 IMPLANT
NEEDLE HYPO 25X1 1.5 SAFETY (NEEDLE) ×1 IMPLANT
PACK MINOR (CUSTOM PROCEDURE TRAY) ×1 IMPLANT
PAD ARMBOARD 7.5X6 YLW CONV (MISCELLANEOUS) ×1 IMPLANT
SET BASIN LINEN APH (SET/KITS/TRAYS/PACK) ×1 IMPLANT
SOL PREP POV-IOD 4OZ 10% (MISCELLANEOUS) ×1 IMPLANT
SOL PREP PROV IODINE SCRUB 4OZ (MISCELLANEOUS) ×1 IMPLANT
SUPPORT SCROTAL LG STRP (MISCELLANEOUS) ×1 IMPLANT
SWAB CULTURE ESWAB REG 1ML (MISCELLANEOUS) IMPLANT
SYR CONTROL 10ML LL (SYRINGE) ×1 IMPLANT

## 2022-06-09 NOTE — Progress Notes (Signed)
Pharmacy Antibiotic Note  Julian Sanchez is a 26 y.o. male admitted on 06/09/2022 with cellulitis/ scrotal abscess .  Pharmacy has been consulted for Vancomycin dosing.  Plan: Vancomycin 1250mg  IV Q 12 hrs. Goal AUC 400-550. Expected AUC: 474 SCr used: 0.91  F/u cxs and clinical progress Monitor V/S, labs and levels as indicated  Height: 6' (182.9 cm) Weight: 65.8 kg (145 lb) IBW/kg (Calculated) : 77.6  Temp (24hrs), Avg:100.2 F (37.9 C), Min:99.1 F (37.3 C), Max:100.9 F (38.3 C)  Recent Labs  Lab 06/09/22 0854 06/09/22 1118  WBC 12.8*  --   CREATININE 0.91  --   LATICACIDVEN  --  0.7    Estimated Creatinine Clearance: 112.5 mL/min (by C-G formula based on SCr of 0.91 mg/dL).    No Known Allergies  Antimicrobials this admission: Vancomycin 8/29 >>  Ceftriaxone  8/29 in ED  Microbiology results: 8/29 BCx: pending  MRSA PCR:   Thank you for allowing pharmacy to be a part of this patient's care.  9/29, BS Pharm D, BCPS Clinical Pharmacist 06/09/2022 12:47 PM

## 2022-06-09 NOTE — ED Triage Notes (Signed)
Patient states he has thrown up 11 times since last night with diarrhea. Patient states pain an umbilical region.

## 2022-06-09 NOTE — Transfer of Care (Signed)
Immediate Anesthesia Transfer of Care Note  Patient: Julian Sanchez  Procedure(s) Performed: INCISION AND DRAINAGE ABSCESS (Right: Scrotum)  Patient Location: PACU  Anesthesia Type:General  Level of Consciousness: awake, alert  and oriented  Airway & Oxygen Therapy: Patient Spontanous Breathing  Post-op Assessment: Report given to RN and Post -op Vital signs reviewed and stable  Post vital signs: Reviewed and stable  Last Vitals:  Vitals Value Taken Time  BP 109/73 06/09/22 1512  Temp 39.2 C 06/09/22 1512  Pulse 98 06/09/22 1512  Resp 19 06/09/22 1512  SpO2 97 % 06/09/22 1512    Last Pain:  Vitals:   06/09/22 1338  TempSrc: Oral  PainSc: 7       Patients Stated Pain Goal: 5 (06/09/22 1338)  Complications: No notable events documented.

## 2022-06-09 NOTE — Consult Note (Signed)
Urology Consult   Physician requesting consult: Pricilla Loveless, MD  Reason for consult: scrotal wall abscess  History of Present Illness: Julian Sanchez is a 26 y.o. male who is seen in consultation for evaluation of a scrotal wall abscess.  He presented to the emergency room this morning with symptoms of fever, vomiting, and diarrhea.  He reports the symptoms began after eating a sandwich at work yesterday.  He has reported periumbilical abdominal pain.  He also has noted swelling and pain in the right scrotal wall for approximately 24 hours.  He reports a small amount of purulent drainage from this area.  The area is tender to touch.  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  This last occurred approximately 1 year ago.  CT imaging from this morning shows a 3 cm fluid collection in the right scrotal wall consistent with a possible abscess. He denies any voiding symptoms.  No dysuria or gross hematuria.   Past Medical History:  Diagnosis Date   ADHD (attention deficit hyperactivity disorder)    HIV disease (HCC)    Hx of seasonal allergies    No pertinent past medical history    Recurrent tonsillitis    Wisdom teeth extracted     Past Surgical History:  Procedure Laterality Date   BIOPSY  03/08/2021   Procedure: BIOPSY;  Surgeon: Bernette Redbird, MD;  Location: [facility];  Service: Endoscopy;;   FLEXIBLE SIGMOIDOSCOPY N/A 03/08/2021   Procedure: Arnell Sieving;  Surgeon: Bernette Redbird, MD;  Location: [facility];  Service: Endoscopy;  Laterality: N/A;   IRRIGATION AND DEBRIDEMENT ABSCESS  09/28/2012   Procedure: IRRIGATION AND DEBRIDEMENT ABSCESS;  Surgeon: Flo Shanks, MD;  Location: [facility] OR;  Service: ENT;  Laterality: Right;  Right Peritonsillar Abscess   IRRIGATION AND DEBRIDEMENT ABSCESS N/A 03/05/2021   Procedure: IRRIGATION AND DEBRIDEMENT ABSCESS;  Surgeon: Crista Elliot, MD;  Location: [facility] ORS;  Service: Urology;   Laterality: N/A;   MOUTH SURGERY     SCROTAL EXPLORATION N/A 03/05/2021   Procedure: SCROTUM EXPLORATION;  Surgeon: Crista Elliot, MD;  Location: [facility] ORS;  Service: Urology;  Laterality: N/A;   TONSILLECTOMY     TONSILLECTOMY N/A 07/23/2017   Procedure: TONSILLECTOMY;  Surgeon: Christia Reading, MD;  Location: [facility] OR;  Service: ENT;  Laterality: N/A;   WISDOM TOOTH EXTRACTION      Medications:  Scheduled Meds: Continuous Infusions:  cefTRIAXone (ROCEPHIN)  IV     vancomycin     PRN Meds:.  Allergies: No Known Allergies  Family History  Problem Relation Age of Onset   Diabetes Mother    Asthma Father     Social History:  reports that he has been smoking cigarettes. He has been smoking an average of .4 packs per day. He has never used smokeless tobacco. He reports current drug use. Frequency: 7.00 times per week. Drug: Marijuana. He reports that he does not drink alcohol.  ROS: A complete review of systems was performed.  All systems are negative except for pertinent findings as noted.  Physical Exam:  Vital signs in last 24 hours: Temp:  [99.1 F (37.3 C)-100.9 F (38.3 C)] 100.9 F (38.3 C) (08/29 1010) Pulse Rate:  [97-115] 97 (08/29 1110) Resp:  [16-20] 16 (08/29 1110) BP: (98-126)/(50-71) 98/50 (08/29 1110) SpO2:  [97 %-100 %] 98 % (08/29 1110) Weight:  [65.8 kg] 65.8 kg (08/29 0820) GENERAL APPEARANCE:  Well appearing, well developed, well nourished, NAD  HEENT:  Atraumatic, normocephalic, oropharynx clear NECK:  Supple without lymphadenopathy or thyromegaly ABDOMEN:  Soft, non-tender, no masses EXTREMITIES:  Moves all extremities well, without clubbing, cyanosis, or edema NEUROLOGIC:  Alert and oriented x 3, CN II-XII grossly intact MENTAL STATUS:  appropriate BACK:  Non-tender to palpation, No CVAT SKIN:  Warm, dry, and intact GU: Penis:  circumcised Meatus: Normal Scrotum: 3 cm area of fluctuance on right lateral scrotal wall, tender to palpation, no  necrosis Testis: normal without masses bilateral   Laboratory Data:  Recent Labs    06/09/22 0854  WBC 12.8*  HGB 12.5*  HCT 37.3*  PLT 203    Recent Labs    06/09/22 0854  NA 132*  K 3.0*  CL 103  GLUCOSE 128*  BUN 12  CALCIUM 8.8*  CREATININE 0.91     Results for orders placed or performed during the hospital encounter of 06/09/22 (from the past 24 hour(s))  Lipase, blood     Status: None   Collection Time: 06/09/22  8:54 AM  Result Value Ref Range   Lipase 28 11 - 51 U/L  Comprehensive metabolic panel     Status: Abnormal   Collection Time: 06/09/22  8:54 AM  Result Value Ref Range   Sodium 132 (L) 135 - 145 mmol/L   Potassium 3.0 (L) 3.5 - 5.1 mmol/L   Chloride 103 98 - 111 mmol/L   CO2 20 (L) 22 - 32 mmol/L   Glucose, Bld 128 (H) 70 - 99 mg/dL   BUN 12 6 - 20 mg/dL   Creatinine, Ser 4.08 0.61 - 1.24 mg/dL   Calcium 8.8 (L) 8.9 - 10.3 mg/dL   Total Protein 8.8 (H) 6.5 - 8.1 g/dL   Albumin 3.7 3.5 - 5.0 g/dL   AST 20 15 - 41 U/L   ALT 10 0 - 44 U/L   Alkaline Phosphatase 64 38 - 126 U/L   Total Bilirubin 0.8 0.3 - 1.2 mg/dL   GFR, Estimated >14 >48 mL/min   Anion gap 9 5 - 15  CBC     Status: Abnormal   Collection Time: 06/09/22  8:54 AM  Result Value Ref Range   WBC 12.8 (H) 4.0 - 10.5 K/uL   RBC 4.79 4.22 - 5.81 MIL/uL   Hemoglobin 12.5 (L) 13.0 - 17.0 g/dL   HCT 18.5 (L) 63.1 - 49.7 %   MCV 77.9 (L) 80.0 - 100.0 fL   MCH 26.1 26.0 - 34.0 pg   MCHC 33.5 30.0 - 36.0 g/dL   RDW 02.6 37.8 - 58.8 %   Platelets 203 150 - 400 K/uL   nRBC 0.0 0.0 - 0.2 %  Resp Panel by RT-PCR (Flu A&B, Covid) Anterior Nasal Swab     Status: None   Collection Time: 06/09/22  8:54 AM   Specimen: Anterior Nasal Swab  Result Value Ref Range   SARS Coronavirus 2 by RT PCR NEGATIVE NEGATIVE   Influenza A by PCR NEGATIVE NEGATIVE   Influenza B by PCR NEGATIVE NEGATIVE   Recent Results (from the past 240 hour(s))  Resp Panel by RT-PCR (Flu A&B, Covid) Anterior Nasal Swab      Status: None   Collection Time: 06/09/22  8:54 AM   Specimen: Anterior Nasal Swab  Result Value Ref Range Status   SARS Coronavirus 2 by RT PCR NEGATIVE NEGATIVE Final    Comment: (NOTE) SARS-CoV-2 target nucleic acids are NOT DETECTED.  The SARS-CoV-2 RNA is generally detectable in upper respiratory  specimens during the acute phase of infection. The lowest concentration of SARS-CoV-2 viral copies this assay can detect is 138 copies/mL. A negative result does not preclude SARS-Cov-2 infection and should not be used as the sole basis for treatment or other patient management decisions. A negative result may occur with  improper specimen collection/handling, submission of specimen other than nasopharyngeal swab, presence of viral mutation(s) within the areas targeted by this assay, and inadequate number of viral copies(<138 copies/mL). A negative result must be combined with clinical observations, patient history, and epidemiological information. The expected result is Negative.  Fact Sheet for Patients:  BloggerCourse.com  Fact Sheet for Healthcare Providers:  SeriousBroker.it  This test is no t yet approved or cleared by the [place] FDA and  has been authorized for detection and/or diagnosis of SARS-CoV-2 by FDA under an Emergency Use Authorization (EUA). This EUA will remain  in effect (meaning this test can be used) for the duration of the COVID-19 declaration under Section 564(b)(1) of the Act, 21 U.S.C.section 360bbb-3(b)(1), unless the authorization is terminated  or revoked sooner.       Influenza A by PCR NEGATIVE NEGATIVE Final   Influenza B by PCR NEGATIVE NEGATIVE Final    Comment: (NOTE) The Xpert Xpress SARS-CoV-2/FLU/RSV plus assay is intended as an aid in the diagnosis of influenza from Nasopharyngeal swab specimens and should not be used as a sole basis for treatment. Nasal washings and aspirates are  unacceptable for Xpert Xpress SARS-CoV-2/FLU/RSV testing.  Fact Sheet for Patients: BloggerCourse.com  Fact Sheet for Healthcare Providers: SeriousBroker.it  This test is not yet approved or cleared by the [place] FDA and has been authorized for detection and/or diagnosis of SARS-CoV-2 by FDA under an Emergency Use Authorization (EUA). This EUA will remain in effect (meaning this test can be used) for the duration of the COVID-19 declaration under Section 564(b)(1) of the Act, 21 U.S.C. section 360bbb-3(b)(1), unless the authorization is terminated or revoked.  Performed at [facility], [address] [place], [place] [address]     Renal Function: Recent Labs    06/09/22 0854  CREATININE 0.91   Estimated Creatinine Clearance: 112.5 mL/min (by C-G formula based on SCr of 0.91 mg/dL).  Radiologic Imaging: CT ABDOMEN PELVIS W CONTRAST  Result Date: 06/09/2022 CLINICAL DATA:  Acute generalized abdominal pain, nausea, vomiting. Sepsis. EXAM: CT ABDOMEN AND PELVIS WITH CONTRAST TECHNIQUE: Multidetector CT imaging of the abdomen and pelvis was performed using the standard protocol following bolus administration of intravenous contrast. RADIATION DOSE REDUCTION: This exam was performed according to the departmental dose-optimization program which includes automated exposure control, adjustment of the mA and/or kV according to patient size and/or use of iterative reconstruction technique. CONTRAST:  OMNIPAQUE IOHEXOL 300 MG/ML  SOLN COMPARISON:  December 14, 2021.  Mar 05, 2021. FINDINGS: Lower chest: No acute abnormality. Hepatobiliary: No focal liver abnormality is seen. No gallstones, gallbladder wall thickening, or biliary dilatation. Pancreas: Unremarkable. No pancreatic ductal dilatation or surrounding inflammatory changes. Spleen: Normal in size without focal abnormality. Adrenals/Urinary Tract: Adrenal glands appear normal.  Horseshoe configuration of the kidneys is noted. Small nonobstructive left renal calculus is noted. No hydronephrosis or renal obstruction is noted. Urinary bladder is unremarkable. Stomach/Bowel: Stomach is within normal limits. Appendix appears normal. No evidence of bowel wall thickening, distention, or inflammatory changes. Vascular/Lymphatic: No significant vascular findings are present. No enlarged abdominal or pelvic lymph nodes. Reproductive: Prostate is unremarkable. Other: No abdominal wall hernia or abnormality. No abdominopelvic ascites.  However, 3.2 cm fluid collection is noted in the right scrotum. 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 No acute osseous abnormality is noted. IMPRESSION: Nonobstructive left renal calculus is noted. No hydronephrosis or renal obstruction is noted. 3.2 cm fluid collection is seen in the right scrotum. Abscess cannot be excluded. Scrotal ultrasound may be performed for further evaluation. Electronically Signed   By: Lupita Raider M.D.   On: 06/09/2022 10:42   DG Chest Portable 1 View  Result Date: 06/09/2022 CLINICAL DATA:  Cough, fever, GI symptoms, vomiting, diarrhea, periumbilical pain EXAM: PORTABLE CHEST 1 VIEW COMPARISON:  Portable exam 0856 hours compared to 03/04/2021 FINDINGS: Normal heart size, mediastinal contours, and pulmonary vascularity. Lungs clear. No pulmonary infiltrate, pleural effusion, or pneumothorax. Osseous structures unremarkable. IMPRESSION: No acute abnormalities. Electronically Signed   By: Ulyses Southward M.D.   On: 06/09/2022 09:06    I independently reviewed the above imaging studies.  Impression/Recommendations:  Right scrotal wall abscess  I discussed the findings on examination and on imaging with the patient.  His exam is consistent with a right scrotal wall abscess.   Will obtain scrotal ultrasound to further evaluate scrotal contents. I recommended management with incision and drainage.  I  advised the patient that this could be done under local anesthetic at the bedside.  He refused and stated that he could only have this done under anesthesia in the operating room.  I discussed the potential risk of the procedure including infection, bleeding, injury to scrotal structures including the testicle and spermatic cord, need for additional procedures, and anesthetic risk.  He understands and wishes to proceed as described.   The patient will be scheduled for incision and drainage of right scrotal wall abscess at [facility].  Surgical request is placed with the surgery schedulers and will be scheduled at the patient's/family request. Informed consent is given as documented below. Anesthesia: General  The patient does not have sleep apnea, history of MRSA, history of VRE, history of cardiac device requiring special anesthetic needs. Patient is stable and considered clear for surgical in an outpatient ambulatory surgery setting as well as patient hospital setting.  Consent for Operation or Procedure: Provider Certification I hereby certify that the nature, purpose, benefits, usual and most frequent risks of, and alternatives to, the operation or procedure have been explained to the patient (or person authorized to sign for the patient) either by me as responsible physician or by the provider who is to perform the operation or procedure. Time spent such that the patient/family has had an opportunity to ask questions, and that those questions have been answered. The patient or the patient's representative has been advised that selected tasks may be performed by assistants to the primary health care provider(s). I believe that the patient (or person authorized to sign for the patient) understands what has been explained, and has consented to the operation or procedure. No guarantees were implied or made.   Di Kindle 06/09/2022, 11:48 AM

## 2022-06-09 NOTE — Op Note (Signed)
OPERATIVE NOTE   Patient Name: Julian Sanchez  MRN: [id number]   Date of Procedure: 06/09/22  Preoperative diagnosis:  Right scrotal wall abscess  Postoperative diagnosis:  Right scrotal wall abscess  Procedure:  Incision and drainage of right scrotal wall abscess  Attending: Milderd Meager, MD  Anesthesia: General with local  Estimated blood loss: 5 ml  Fluids: Per anesthesia record  Drains: None  Specimens: Abscess fluid sent for aerobic and anaerobic culture  Antibiotics: Rocephin, vancomycin  Findings: 3 cm abscess right scrotal wall  Indications:  26 year old male who presents with a right scrotal wall abscess found during evaluation for fever and abdominal pain.  He has a prior history of scrotal abscesses requiring incision and drainage.  He reports onset of right scrotal wall swelling for approximately 24 hours.  He has noted a small amount of drainage.  He noted fever as well as abdominal pain with associated nausea, vomiting, and diarrhea x24 hours.  No voiding difficulty.  Description of Procedure:  The patient was on Rocephin and vancomycin preoperatively.  After successful induction of a general anesthetic, the patient was placed on the operating room table in the supine position.  The patient's genital area was prepped and draped in sterile fashion.  Examination revealed a 3 cm fluctuant area on the right lateral scrotal wall with surrounding induration.  No necrotic tissue was seen.  Incision of the area was performed with immediate return of purulent fluid.  Aerobic and anaerobic cultures were sent.  The incision was continued superiorly to completely open to the abscess cavity.  The abscess cavity was probed with a fingertip and no undrained areas were identified.  The wound was copiously irrigated with normal saline.  Hemostasis was obtained with cautery.  The wound was packed with Kerlix gauze.  5 mL of 0.25% plain Marcaine was injected along the wound  edges.  Sterile gauze was placed over the Curlex and a scrotal support was used.  The patient was then extubated and taken to the postanesthesia care in stable condition.  Complications: None  Condition: Stable, extubated, transferred to PACU  Plan:  Continue IV antibiotics pending culture results Begin dressing changes tomorrow with wet-to-dry dressings.

## 2022-06-09 NOTE — ED Provider Notes (Signed)
[facility] EMERGENCY DEPARTMENT Provider Note   CSN: [id number] Arrival date & time: 06/09/22  0813     History  Chief Complaint  Patient presents with   Abdominal Pain    Julian Sanchez is a 26 y.o. male.  HPI 26 year old male presents with fever, vomiting, diarrhea.  Symptoms started last night while at work.  He ate a wrap that he did not prepare and then sometime later developed diarrhea.  He is also developed vomiting.  Has been unable to keep anything down.  Has felt hot and cold though not checked a temperature.  He is also developed a cough.  No shortness of breath, sore throat.  He is having periumbilical abdominal pain.  Home Medications Prior to Admission medications   Medication Sig Start Date End Date Taking? Authorizing Provider  bictegravir-emtricitabine-tenofovir AF (BIKTARVY) 50-200-25 MG TABS tablet Take 1 tablet by mouth daily. 03/12/21  Yes Cliffton Asters, MD  erythromycin ophthalmic ointment Place a 1/2 inch ribbon of ointment into the lower eyelid. Patient not taking: Reported on 06/09/2022 05/19/22   Theron Arista, PA-C  oxyCODONE (ROXICODONE) 5 MG immediate release tablet Take 1 tablet (5 mg total) by mouth every 4 (four) hours as needed for up to 6 doses for severe pain. Patient not taking: Reported on 06/09/2022 12/14/21   Tanda Rockers A, DO  polyethylene glycol (MIRALAX / GLYCOLAX) 17 g packet Take 17 g by mouth daily as needed for mild constipation. Patient not taking: Reported on 06/09/2022 03/09/21   Albertine Grates, MD      Allergies    Patient has no known allergies.    Review of Systems   Review of Systems  Constitutional:  Positive for fever.  Respiratory:  Positive for cough. Negative for shortness of breath.   Gastrointestinal:  Positive for abdominal pain, diarrhea, nausea and vomiting.    Physical Exam Updated Vital Signs BP 103/66   Pulse 93   Temp (!) 100.9 F (38.3 C)   Resp 16   Ht 6' (1.829 m)   Wt 65.8 kg   SpO2 97%   BMI 19.67 kg/m   Physical Exam Vitals and nursing note reviewed.  Constitutional:      Appearance: He is well-developed.  HENT:     Head: Normocephalic and atraumatic.  Cardiovascular:     Rate and Rhythm: Regular rhythm. Tachycardia present.     Heart sounds: Normal heart sounds.  Pulmonary:     Effort: Pulmonary effort is normal.     Breath sounds: Normal breath sounds. No wheezing.  Abdominal:     Palpations: Abdomen is soft.     Tenderness: There is generalized abdominal tenderness.  Skin:    General: Skin is warm and dry.  Neurological:     Mental Status: He is alert.     ED Results / Procedures / Treatments   Labs (all labs ordered are listed, but only abnormal results are displayed) Labs Reviewed  COMPREHENSIVE METABOLIC PANEL - Abnormal; Notable for the following components:      Result Value   Sodium 132 (*)    Potassium 3.0 (*)    CO2 20 (*)    Glucose, Bld 128 (*)    Calcium 8.8 (*)    Total Protein 8.8 (*)    All other components within normal limits  CBC - Abnormal; Notable for the following components:   WBC 12.8 (*)    Hemoglobin 12.5 (*)    HCT 37.3 (*)    MCV 77.9 (*)  All other components within normal limits  RESP PANEL BY RT-PCR (FLU A&B, COVID) ARPGX2  CULTURE, BLOOD (ROUTINE X 2)  CULTURE, BLOOD (ROUTINE X 2)  LIPASE, BLOOD  LACTIC ACID, PLASMA  URINALYSIS, ROUTINE W REFLEX MICROSCOPIC  LACTIC ACID, PLASMA  PROTIME-INR  APTT    EKG None  Radiology CT ABDOMEN PELVIS W CONTRAST  Result Date: 06/09/2022 CLINICAL DATA:  Acute generalized abdominal pain, nausea, vomiting. Sepsis. EXAM: CT ABDOMEN AND PELVIS WITH CONTRAST TECHNIQUE: Multidetector CT imaging of the abdomen and pelvis was performed using the standard protocol following bolus administration of intravenous contrast. RADIATION DOSE REDUCTION: This exam was performed according to the departmental dose-optimization program which includes automated exposure control, adjustment of the mA and/or kV  according to patient size and/or use of iterative reconstruction technique. CONTRAST:  OMNIPAQUE IOHEXOL 300 MG/ML  SOLN COMPARISON:  December 14, 2021.  Mar 05, 2021. FINDINGS: Lower chest: No acute abnormality. Hepatobiliary: No focal liver abnormality is seen. No gallstones, gallbladder wall thickening, or biliary dilatation. Pancreas: Unremarkable. No pancreatic ductal dilatation or surrounding inflammatory changes. Spleen: Normal in size without focal abnormality. Adrenals/Urinary Tract: Adrenal glands appear normal. Horseshoe configuration of the kidneys is noted. Small nonobstructive left renal calculus is noted. No hydronephrosis or renal obstruction is noted. Urinary bladder is unremarkable. Stomach/Bowel: Stomach is within normal limits. Appendix appears normal. No evidence of bowel wall thickening, distention, or inflammatory changes. Vascular/Lymphatic: No significant vascular findings are present. No enlarged abdominal or pelvic lymph nodes. Reproductive: Prostate is unremarkable. Other: No abdominal wall hernia or abnormality. No abdominopelvic ascites. However, 3.2 cm fluid collection is noted in the right scrotum. 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 No acute osseous abnormality is noted. IMPRESSION: Nonobstructive left renal calculus is noted. No hydronephrosis or renal obstruction is noted. 3.2 cm fluid collection is seen in the right scrotum. Abscess cannot be excluded. Scrotal ultrasound may be performed for further evaluation. Electronically Signed   By: Lupita Raider M.D.   On: 06/09/2022 10:42   DG Chest Portable 1 View  Result Date: 06/09/2022 CLINICAL DATA:  Cough, fever, GI symptoms, vomiting, diarrhea, periumbilical pain EXAM: PORTABLE CHEST 1 VIEW COMPARISON:  Portable exam 0856 hours compared to 03/04/2021 FINDINGS: Normal heart size, mediastinal contours, and pulmonary vascularity. Lungs clear. No pulmonary infiltrate, pleural effusion, or  pneumothorax. Osseous structures unremarkable. IMPRESSION: No acute abnormalities. Electronically Signed   By: Ulyses Southward M.D.   On: 06/09/2022 09:06    Procedures .Critical Care  Performed by: Pricilla Loveless, MD Authorized by: Pricilla Loveless, MD   Critical care provider statement:    Critical care time (minutes):  30   Critical care time was exclusive of:  Separately billable procedures and treating other patients   Critical care was necessary to treat or prevent imminent or life-threatening deterioration of the following conditions:  Sepsis   Critical care was time spent personally by me on the following activities:  Development of treatment plan with patient or surrogate, discussions with consultants, evaluation of patient's response to treatment, examination of patient, ordering and review of laboratory studies, ordering and review of radiographic studies, ordering and performing treatments and interventions, pulse oximetry, re-evaluation of patient's condition and review of old charts     Medications Ordered in ED Medications  cefTRIAXone (ROCEPHIN) 2 g in sodium chloride 0.9 % 100 mL IVPB (2 g Intravenous New Bag/Given 06/09/22 1152)  vancomycin (VANCOREADY) IVPB 1250 mg/250 mL (has no administration in time range)  ondansetron (  ZOFRAN) injection 4 mg (4 mg Intravenous Given 06/09/22 0906)  acetaminophen (TYLENOL) tablet 1,000 mg (1,000 mg Oral Given 06/09/22 0909)  lactated ringers bolus 1,000 mL (0 mLs Intravenous Stopped 06/09/22 1012)  potassium chloride (KLOR-CON) packet 40 mEq (40 mEq Oral Given 06/09/22 1012)  lactated ringers bolus 1,000 mL (0 mLs Intravenous Stopped 06/09/22 1104)  fentaNYL (SUBLIMAZE) injection 50 mcg (50 mcg Intravenous Given 06/09/22 1014)  iohexol (OMNIPAQUE) 300 MG/ML solution 100 mL (100 mLs Intravenous Contrast Given 06/09/22 1031)    ED Course/ Medical Decision Making/ A&P Clinical Course as of 06/09/22 1222  Tue Jun 09, 2022  1118 After CT results  and images viewed by myself, I went back to ask further questions of the patient and he does state that he has had a "boil" to his right scrotum.  Has a clear developing abscess as well.  This correlates with the fluid collection.  He is now also having softer blood pressures at 98/50.  Mildly tachycardic.  We will start IV antibiotics and get cultures given his HIV status and systemic symptoms.  I discussed with Dr. Pete Glatter of urology who will review images and evaluate patient. [SG]    Clinical Course User Index [SG] Pricilla Loveless, MD                           Medical Decision Making Amount and/or Complexity of Data Reviewed Labs: ordered.    Details: Leukocytosis of 12.8.  Mild acidosis with bicarb of 20 and a hypokalemia to 3.0 Radiology: ordered and independent interpretation performed.    Details: No SBO on CT abdomen and pelvis but he does have a fluid collection to the right scrotum  Risk OTC drugs. Prescription drug management. Decision regarding hospitalization.   Overall I suspect patient's systemic illness is probably from the abscess.  Chart review shows he had been admitted a couple times for this and had an ED incision and drainage once before.  However he tells me he cannot tolerate incision and drainage without "being put to sleep".  He has had a couple soft blood pressures in the 90s.  His most recent CD4 count was around 200 last year.  He could also have a GI illness but either way I think he has a Sirs response to an infection and needs to be admitted for further supportive care.  He will be started on IV antibiotics.  I discussed with Dr. Pete Glatter of urology as above and he will take to the operating room.        Final Clinical Impression(s) / ED Diagnoses Final diagnoses:  Scrotal abscess    Rx / DC Orders ED Discharge Orders     None         Pricilla Loveless, MD 06/09/22 1223

## 2022-06-09 NOTE — H&P (Signed)
History and Physical    LARNIE HEART ZOX:[id number] DOB: 04-16-1996 DOA: 06/09/2022  PCP: Patient, No Pcp Per   Patient coming from: Home  Chief Complaint: Abdominal pain with N/V/D  HPI: Julian Sanchez is a 26 y.o. male with medical history significant for HIV, tobacco abuse, prior scrotal abscess, and marijuana use who presented to the ED with complaints of mild fever as well as some nausea and vomiting as well as diarrhea over the last few days.  He has also been complaining of generalized abdominal pain and was noted to have a boil on his scrotum which was quite concerning.  He is noted to have a prior scrotal abscess and became concerned about this and therefore presented to the ED.  As of late, he has been using more marijuana.  He denies any chest pain or shortness of breath.   ED Course: Vital signs with temperature 100.9 F, leukocytosis 12,800, potassium 3, sodium 132.  CT abdomen pelvis with nonobstructing renal calculus and was notable for fluid collection in the scrotal region for which follow-up ultrasound was performed demonstrating right-sided scrotal wall abscess.  Patient was seen by urology with recommendations for bedside drainage, however patient felt uncomfortable with this and prefers OR.  He has been started on Rocephin and vancomycin.  Review of Systems: Reviewed as noted above, otherwise negative.  Past Medical History:  Diagnosis Date   ADHD (attention deficit hyperactivity disorder)    HIV disease (HCC)    Hx of seasonal allergies    No pertinent past medical history    Recurrent tonsillitis    Wisdom teeth extracted     Past Surgical History:  Procedure Laterality Date   BIOPSY  03/08/2021   Procedure: BIOPSY;  Surgeon: Bernette Redbird, MD;  Location: [facility];  Service: Endoscopy;;   FLEXIBLE SIGMOIDOSCOPY N/A 03/08/2021   Procedure: Arnell Sieving;  Surgeon: Bernette Redbird, MD;  Location: [facility];  Service: Endoscopy;  Laterality: N/A;    IRRIGATION AND DEBRIDEMENT ABSCESS  09/28/2012   Procedure: IRRIGATION AND DEBRIDEMENT ABSCESS;  Surgeon: Flo Shanks, MD;  Location: [facility] OR;  Service: ENT;  Laterality: Right;  Right Peritonsillar Abscess   IRRIGATION AND DEBRIDEMENT ABSCESS N/A 03/05/2021   Procedure: IRRIGATION AND DEBRIDEMENT ABSCESS;  Surgeon: Crista Elliot, MD;  Location: [facility] ORS;  Service: Urology;  Laterality: N/A;   MOUTH SURGERY     SCROTAL EXPLORATION N/A 03/05/2021   Procedure: SCROTUM EXPLORATION;  Surgeon: Crista Elliot, MD;  Location: [facility] ORS;  Service: Urology;  Laterality: N/A;   TONSILLECTOMY     TONSILLECTOMY N/A 07/23/2017   Procedure: TONSILLECTOMY;  Surgeon: Christia Reading, MD;  Location: [facility] OR;  Service: ENT;  Laterality: N/A;   WISDOM TOOTH EXTRACTION       reports that he has been smoking cigarettes. He has been smoking an average of .4 packs per day. He has never used smokeless tobacco. He reports current drug use. Frequency: 7.00 times per week. Drug: Marijuana. He reports that he does not drink alcohol.  No Known Allergies  Family History  Problem Relation Age of Onset   Diabetes Mother    Asthma Father     Prior to Admission medications   Medication Sig Start Date End Date Taking? Authorizing Provider  bictegravir-emtricitabine-tenofovir AF (BIKTARVY) 50-200-25 MG TABS tablet Take 1 tablet by mouth daily. 03/12/21  Yes Cliffton Asters, MD  erythromycin ophthalmic ointment Place a 1/2 inch ribbon of ointment into the lower eyelid. Patient not taking:  Reported on 06/09/2022 05/19/22   Theron Arista, PA-C  oxyCODONE (ROXICODONE) 5 MG immediate release tablet Take 1 tablet (5 mg total) by mouth every 4 (four) hours as needed for up to 6 doses for severe pain. Patient not taking: Reported on 06/09/2022 12/14/21   Tanda Rockers A, DO  polyethylene glycol (MIRALAX / GLYCOLAX) 17 g packet Take 17 g by mouth daily as needed for mild constipation. Patient not taking: Reported on 06/09/2022 03/09/21   Albertine Grates, MD    Physical Exam: Vitals:   06/09/22 1130 06/09/22 1200 06/09/22 1230 06/09/22 1300  BP: 103/66 99/68 110/71 107/78  Pulse: 93 85 85 85  Resp: 16 16 14 17   Temp:      TempSrc:      SpO2: 97% 97% 98% 96%  Weight:      Height:        Constitutional: NAD, calm, comfortable Vitals:   06/09/22 1130 06/09/22 1200 06/09/22 1230 06/09/22 1300  BP: 103/66 99/68 110/71 107/78  Pulse: 93 85 85 85  Resp: 16 16 14 17   Temp:      TempSrc:      SpO2: 97% 97% 98% 96%  Weight:      Height:       Eyes: lids and conjunctivae normal Neck: normal, supple Respiratory: clear to auscultation bilaterally. Normal respiratory effort. No accessory muscle use.  Cardiovascular: Regular rate and rhythm, no murmurs. Abdomen: no tenderness, no distention. Bowel sounds positive.  Musculoskeletal:  No edema. Skin: no rashes, lesions, ulcers.  Psychiatric: Flat affect  Labs on Admission: I have personally reviewed following labs and imaging studies  CBC: Recent Labs  Lab 06/09/22 0854  WBC 12.8*  HGB 12.5*  HCT 37.3*  MCV 77.9*  PLT 203   Basic Metabolic Panel: Recent Labs  Lab 06/09/22 0854  NA 132*  K 3.0*  CL 103  CO2 20*  GLUCOSE 128*  BUN 12  CREATININE 0.91  CALCIUM 8.8*   GFR: Estimated Creatinine Clearance: 112.5 mL/min (by C-G formula based on SCr of 0.91 mg/dL). Liver Function Tests: Recent Labs  Lab 06/09/22 0854  AST 20  ALT 10  ALKPHOS 64  BILITOT 0.8  PROT 8.8*  ALBUMIN 3.7   Recent Labs  Lab 06/09/22 0854  LIPASE 28   No results for input(s): "AMMONIA" in the last 168 hours. Coagulation Profile: No results for input(s): "INR", "PROTIME" in the last 168 hours. Cardiac Enzymes: No results for input(s): "CKTOTAL", "CKMB", "CKMBINDEX", "TROPONINI" in the last 168 hours. BNP (last 3 results) No results for input(s): "PROBNP" in the last 8760 hours. HbA1C: No results for input(s): "HGBA1C" in the last 72 hours. CBG: No results for input(s):  "GLUCAP" in the last 168 hours. Lipid Profile: No results for input(s): "CHOL", "HDL", "LDLCALC", "TRIG", "CHOLHDL", "LDLDIRECT" in the last 72 hours. Thyroid Function Tests: No results for input(s): "TSH", "T4TOTAL", "FREET4", "T3FREE", "THYROIDAB" in the last 72 hours. Anemia Panel: No results for input(s): "VITAMINB12", "FOLATE", "FERRITIN", "TIBC", "IRON", "RETICCTPCT" in the last 72 hours. Urine analysis:    Component Value Date/Time   COLORURINE AMBER (A) 12/13/2021 2243   APPEARANCEUR CLOUDY (A) 12/13/2021 2243   LABSPEC 1.029 12/13/2021 2243   PHURINE 6.0 12/13/2021 2243   GLUCOSEU NEGATIVE 12/13/2021 2243   HGBUR LARGE (A) 12/13/2021 2243   BILIRUBINUR NEGATIVE 12/13/2021 2243   KETONESUR NEGATIVE 12/13/2021 2243   PROTEINUR >=300 (A) 12/13/2021 2243   UROBILINOGEN 0.2 06/08/2014 2133   NITRITE NEGATIVE 12/13/2021 2243  LEUKOCYTESUR MODERATE (A) 12/13/2021 2243    Radiological Exams on Admission: US SCROTUM W/DOPPLER  Result Date: 06/09/2022 CLINICAL DATA:  Acute right testicular pain. EXAM: SCROTAL ULTRASOUND DOPPLER ULTRASOUND OF THE TESTICLES TECHNIQUE: Complete ultrasound examination of the testicles, epididymis, and other scrotal structures was performed. Color and spectral Doppler ultrasound were also utilized to evaluate blood flow to the testicles. COMPARISON:  CT scan of same day.  Ultrasound of Mar 05, 2021. FINDINGS: Right testicle Measurements: 3.8 x 2.3 x 1.8 cm. No mass or microlithiasis visualized. Left testicle Measurements: 3.8 x 2.4 x 1.7 cm. No mass or microlithiasis visualized. Right epididymis:  5 mm cyst is noted. Left epididymis:  4 mm cyst is noted. Hydrocele:  Small bilateral hydroceles are noted. Varicocele:  None visualized. Pulsed Doppler interrogation of both testes demonstrates normal low resistance arterial and venous waveforms bilaterally. Rounded complex abnormality measuring 3.6 x 2.5 x 2.4 cm is seen laterally in the right side of the scrotum.  This most likely corresponds to abnormality seen on CT scan of same day. This is concerning for either abscess, hematoma or possibly mass. IMPRESSION: No evidence of testicular mass or torsion. 3.6 x 2.5 x 2.4 cm rounded complex abnormality is noted laterally in the right side of the scrotum which most likely corresponds to abnormality seen on CT scan of same day. This is concerning for either abscess, hematoma or possibly mass. Electronically Signed   By: Lupita Raider M.D.   On: 06/09/2022 12:50   CT ABDOMEN PELVIS W CONTRAST  Result Date: 06/09/2022 CLINICAL DATA:  Acute generalized abdominal pain, nausea, vomiting. Sepsis. EXAM: CT ABDOMEN AND PELVIS WITH CONTRAST TECHNIQUE: Multidetector CT imaging of the abdomen and pelvis was performed using the standard protocol following bolus administration of intravenous contrast. RADIATION DOSE REDUCTION: This exam was performed according to the departmental dose-optimization program which includes automated exposure control, adjustment of the mA and/or kV according to patient size and/or use of iterative reconstruction technique. CONTRAST:  OMNIPAQUE IOHEXOL 300 MG/ML  SOLN COMPARISON:  December 14, 2021.  Mar 05, 2021. FINDINGS: Lower chest: No acute abnormality. Hepatobiliary: No focal liver abnormality is seen. No gallstones, gallbladder wall thickening, or biliary dilatation. Pancreas: Unremarkable. No pancreatic ductal dilatation or surrounding inflammatory changes. Spleen: Normal in size without focal abnormality. Adrenals/Urinary Tract: Adrenal glands appear normal. Horseshoe configuration of the kidneys is noted. Small nonobstructive left renal calculus is noted. No hydronephrosis or renal obstruction is noted. Urinary bladder is unremarkable. Stomach/Bowel: Stomach is within normal limits. Appendix appears normal. No evidence of bowel wall thickening, distention, or inflammatory changes. Vascular/Lymphatic: No significant vascular findings are present.  No enlarged abdominal or pelvic lymph nodes. Reproductive: Prostate is unremarkable. Other: No abdominal wall hernia or abnormality. No abdominopelvic ascites. However, 3.2 cm fluid collection is noted in the right scrotum. 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 No acute osseous abnormality is noted. IMPRESSION: Nonobstructive left renal calculus is noted. No hydronephrosis or renal obstruction is noted. 3.2 cm fluid collection is seen in the right scrotum. Abscess cannot be excluded. Scrotal ultrasound may be performed for further evaluation. Electronically Signed   By: Lupita Raider M.D.   On: 06/09/2022 10:42   DG Chest Portable 1 View  Result Date: 06/09/2022 CLINICAL DATA:  Cough, fever, GI symptoms, vomiting, diarrhea, periumbilical pain EXAM: PORTABLE CHEST 1 VIEW COMPARISON:  Portable exam 0856 hours compared to 03/04/2021 FINDINGS: Normal heart size, mediastinal contours, and pulmonary vascularity. Lungs clear. No pulmonary  infiltrate, pleural effusion, or pneumothorax. Osseous structures unremarkable. IMPRESSION: No acute abnormalities. Electronically Signed   By: Ulyses Southward M.D.   On: 06/09/2022 09:06    EKG: Independently reviewed. 90bpm, SR.  Assessment/Plan Principal Problem:   Scrotal abscess Active Problems:   HIV disease (HCC)   Cigarette smoker   Marijuana use    Right-sided scrotal wall abscess Continue vancomycin as ordered Rocephin dose given in ED Follow blood cultures and MRSA PCR Plans per urology for I&D in OR later today Follow cultures  Possible cannabis hyperemesis versus gastroenteritis Counseled on avoidance of use of THC Noted to have some diarrhea and will obtain GI panel  Hypokalemia Replete and monitor in a.m.  History of HIV Continue home medications, patient has remained compliant  History of tobacco abuse Counseled on cessation  DVT prophylaxis: Lovenox Code Status: Full Family Communication: None at  bedside Disposition Plan: I&D of abscess per urology Consults called: Urology Admission status: Observation, MedSurg  Severity of Illness: The appropriate patient status for this patient is OBSERVATION. Observation status is judged to be reasonable and necessary in order to provide the required intensity of service to ensure the patient's safety. The patient's presenting symptoms, physical exam findings, and initial radiographic and laboratory data in the context of their medical condition is felt to place them at decreased risk for further clinical deterioration. Furthermore, it is anticipated that the patient will be medically stable for discharge from the hospital within 2 midnights of admission.    Velena Keegan D Sherryll Burger DO [facility]  If 7PM-7AM, please contact night-coverage [email or web address]  06/09/2022, 1:23 PM

## 2022-06-09 NOTE — Sepsis Progress Note (Signed)
Sepsis protocol monitored by eLink 

## 2022-06-09 NOTE — Interval H&P Note (Signed)
History and Physical Interval Note:  06/09/2022 2:25 PM  Julian Sanchez  has presented today for surgery, with the diagnosis of Right scrotal wall abscess.  The various methods of treatment have been discussed with the patient and family. After consideration of risks, benefits and other options for treatment, the patient has consented to  Procedure(s): INCISION AND DRAINAGE ABSCESS (Right) as a surgical intervention.  The patient's history has been reviewed, patient examined, no change in status, stable for surgery.  I have reviewed the patient's chart and labs.  Questions were answered to the patient's satisfaction.     Di Kindle

## 2022-06-09 NOTE — H&P (View-Only) (Signed)
Urology Consult   Physician requesting consult: Pricilla Loveless, MD  Reason for consult: scrotal wall abscess  History of Present Illness: Julian Sanchez is a 26 y.o. male who is seen in consultation for evaluation of a scrotal wall abscess.  He presented to the emergency room this morning with symptoms of fever, vomiting, and diarrhea.  He reports the symptoms began after eating a sandwich at work yesterday.  He has reported periumbilical abdominal pain.  He also has noted swelling and pain in the right scrotal wall for approximately 24 hours.  He reports a small amount of purulent drainage from this area.  The area is tender to touch.  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  This last occurred approximately 1 year ago.  CT imaging from this morning shows a 3 cm fluid collection in the right scrotal wall consistent with a possible abscess. He denies any voiding symptoms.  No dysuria or gross hematuria.   Past Medical History:  Diagnosis Date   ADHD (attention deficit hyperactivity disorder)    HIV disease (HCC)    Hx of seasonal allergies    No pertinent past medical history    Recurrent tonsillitis    Wisdom teeth extracted     Past Surgical History:  Procedure Laterality Date   BIOPSY  03/08/2021   Procedure: BIOPSY;  Surgeon: Bernette Redbird, MD;  Location: [facility];  Service: Endoscopy;;   FLEXIBLE SIGMOIDOSCOPY N/A 03/08/2021   Procedure: Arnell Sieving;  Surgeon: Bernette Redbird, MD;  Location: [facility];  Service: Endoscopy;  Laterality: N/A;   IRRIGATION AND DEBRIDEMENT ABSCESS  09/28/2012   Procedure: IRRIGATION AND DEBRIDEMENT ABSCESS;  Surgeon: Flo Shanks, MD;  Location: [facility] OR;  Service: ENT;  Laterality: Right;  Right Peritonsillar Abscess   IRRIGATION AND DEBRIDEMENT ABSCESS N/A 03/05/2021   Procedure: IRRIGATION AND DEBRIDEMENT ABSCESS;  Surgeon: Crista Elliot, MD;  Location: [facility] ORS;  Service: Urology;   Laterality: N/A;   MOUTH SURGERY     SCROTAL EXPLORATION N/A 03/05/2021   Procedure: SCROTUM EXPLORATION;  Surgeon: Crista Elliot, MD;  Location: [facility];  Service: Urology;  Laterality: N/A;   TONSILLECTOMY     TONSILLECTOMY N/A 07/23/2017   Procedure: TONSILLECTOMY;  Surgeon: Christia Reading, MD;  Location: [facility] OR;  Service: ENT;  Laterality: N/A;   WISDOM TOOTH EXTRACTION      Medications:  Scheduled Meds: Continuous Infusions:  cefTRIAXone (ROCEPHIN)  IV     vancomycin     PRN Meds:.  Allergies: No Known Allergies  Family History  Problem Relation Age of Onset   Diabetes Mother    Asthma Father     Social History:  reports that he has been smoking cigarettes. He has been smoking an average of .4 packs per day. He has never used smokeless tobacco. He reports current drug use. Frequency: 7.00 times per week. Drug: Marijuana. He reports that he does not drink alcohol.  ROS: A complete review of systems was performed.  All systems are negative except for pertinent findings as noted.  Physical Exam:  Vital signs in last 24 hours: Temp:  [99.1 F (37.3 C)-100.9 F (38.3 C)] 100.9 F (38.3 C) (08/29 1010) Pulse Rate:  [97-115] 97 (08/29 1110) Resp:  [16-20] 16 (08/29 1110) BP: (98-126)/(50-71) 98/50 (08/29 1110) SpO2:  [97 %-100 %] 98 % (08/29 1110) Weight:  [65.8 kg] 65.8 kg (08/29 0820) GENERAL APPEARANCE:  Well appearing, well developed, well nourished, NAD  HEENT:  Atraumatic, normocephalic, oropharynx clear NECK:  Supple without lymphadenopathy or thyromegaly ABDOMEN:  Soft, non-tender, no masses EXTREMITIES:  Moves all extremities well, without clubbing, cyanosis, or edema NEUROLOGIC:  Alert and oriented x 3, CN II-XII grossly intact MENTAL STATUS:  appropriate BACK:  Non-tender to palpation, No CVAT SKIN:  Warm, dry, and intact GU: Penis:  circumcised Meatus: Normal Scrotum: 3 cm area of fluctuance on right lateral scrotal wall, tender to palpation, no  necrosis Testis: normal without masses bilateral   Laboratory Data:  Recent Labs    06/09/22 0854  WBC 12.8*  HGB 12.5*  HCT 37.3*  PLT 203    Recent Labs    06/09/22 0854  NA 132*  K 3.0*  CL 103  GLUCOSE 128*  BUN 12  CALCIUM 8.8*  CREATININE 0.91     Results for orders placed or performed during the hospital encounter of 06/09/22 (from the past 24 hour(s))  Lipase, blood     Status: None   Collection Time: 06/09/22  8:54 AM  Result Value Ref Range   Lipase 28 11 - 51 U/L  Comprehensive metabolic panel     Status: Abnormal   Collection Time: 06/09/22  8:54 AM  Result Value Ref Range   Sodium 132 (L) 135 - 145 mmol/L   Potassium 3.0 (L) 3.5 - 5.1 mmol/L   Chloride 103 98 - 111 mmol/L   CO2 20 (L) 22 - 32 mmol/L   Glucose, Bld 128 (H) 70 - 99 mg/dL   BUN 12 6 - 20 mg/dL   Creatinine, Ser 4.08 0.61 - 1.24 mg/dL   Calcium 8.8 (L) 8.9 - 10.3 mg/dL   Total Protein 8.8 (H) 6.5 - 8.1 g/dL   Albumin 3.7 3.5 - 5.0 g/dL   AST 20 15 - 41 U/L   ALT 10 0 - 44 U/L   Alkaline Phosphatase 64 38 - 126 U/L   Total Bilirubin 0.8 0.3 - 1.2 mg/dL   GFR, Estimated >14 >48 mL/min   Anion gap 9 5 - 15  CBC     Status: Abnormal   Collection Time: 06/09/22  8:54 AM  Result Value Ref Range   WBC 12.8 (H) 4.0 - 10.5 K/uL   RBC 4.79 4.22 - 5.81 MIL/uL   Hemoglobin 12.5 (L) 13.0 - 17.0 g/dL   HCT 18.5 (L) 63.1 - 49.7 %   MCV 77.9 (L) 80.0 - 100.0 fL   MCH 26.1 26.0 - 34.0 pg   MCHC 33.5 30.0 - 36.0 g/dL   RDW 02.6 37.8 - 58.8 %   Platelets 203 150 - 400 K/uL   nRBC 0.0 0.0 - 0.2 %  Resp Panel by RT-PCR (Flu A&B, Covid) Anterior Nasal Swab     Status: None   Collection Time: 06/09/22  8:54 AM   Specimen: Anterior Nasal Swab  Result Value Ref Range   SARS Coronavirus 2 by RT PCR NEGATIVE NEGATIVE   Influenza A by PCR NEGATIVE NEGATIVE   Influenza B by PCR NEGATIVE NEGATIVE   Recent Results (from the past 240 hour(s))  Resp Panel by RT-PCR (Flu A&B, Covid) Anterior Nasal Swab      Status: None   Collection Time: 06/09/22  8:54 AM   Specimen: Anterior Nasal Swab  Result Value Ref Range Status   SARS Coronavirus 2 by RT PCR NEGATIVE NEGATIVE Final    Comment: (NOTE) SARS-CoV-2 target nucleic acids are NOT DETECTED.  The SARS-CoV-2 RNA is generally detectable in upper respiratory  specimens during the acute phase of infection. The lowest concentration of SARS-CoV-2 viral copies this assay can detect is 138 copies/mL. A negative result does not preclude SARS-Cov-2 infection and should not be used as the sole basis for treatment or other patient management decisions. A negative result may occur with  improper specimen collection/handling, submission of specimen other than nasopharyngeal swab, presence of viral mutation(s) within the areas targeted by this assay, and inadequate number of viral copies(<138 copies/mL). A negative result must be combined with clinical observations, patient history, and epidemiological information. The expected result is Negative.  Fact Sheet for Patients:  BloggerCourse.com  Fact Sheet for Healthcare Providers:  SeriousBroker.it  This test is no t yet approved or cleared by the [place] FDA and  has been authorized for detection and/or diagnosis of SARS-CoV-2 by FDA under an Emergency Use Authorization (EUA). This EUA will remain  in effect (meaning this test can be used) for the duration of the COVID-19 declaration under Section 564(b)(1) of the Act, 21 U.S.C.section 360bbb-3(b)(1), unless the authorization is terminated  or revoked sooner.       Influenza A by PCR NEGATIVE NEGATIVE Final   Influenza B by PCR NEGATIVE NEGATIVE Final    Comment: (NOTE) The Xpert Xpress SARS-CoV-2/FLU/RSV plus assay is intended as an aid in the diagnosis of influenza from Nasopharyngeal swab specimens and should not be used as a sole basis for treatment. Nasal washings and aspirates are  unacceptable for Xpert Xpress SARS-CoV-2/FLU/RSV testing.  Fact Sheet for Patients: BloggerCourse.com  Fact Sheet for Healthcare Providers: SeriousBroker.it  This test is not yet approved or cleared by the [place] FDA and has been authorized for detection and/or diagnosis of SARS-CoV-2 by FDA under an Emergency Use Authorization (EUA). This EUA will remain in effect (meaning this test can be used) for the duration of the COVID-19 declaration under Section 564(b)(1) of the Act, 21 U.S.C. section 360bbb-3(b)(1), unless the authorization is terminated or revoked.  Performed at [facility], [address] [place], [place] [address]     Renal Function: Recent Labs    06/09/22 0854  CREATININE 0.91   Estimated Creatinine Clearance: 112.5 mL/min (by C-G formula based on SCr of 0.91 mg/dL).  Radiologic Imaging: CT ABDOMEN PELVIS W CONTRAST  Result Date: 06/09/2022 CLINICAL DATA:  Acute generalized abdominal pain, nausea, vomiting. Sepsis. EXAM: CT ABDOMEN AND PELVIS WITH CONTRAST TECHNIQUE: Multidetector CT imaging of the abdomen and pelvis was performed using the standard protocol following bolus administration of intravenous contrast. RADIATION DOSE REDUCTION: This exam was performed according to the departmental dose-optimization program which includes automated exposure control, adjustment of the mA and/or kV according to patient size and/or use of iterative reconstruction technique. CONTRAST:  OMNIPAQUE IOHEXOL 300 MG/ML  SOLN COMPARISON:  December 14, 2021.  Mar 05, 2021. FINDINGS: Lower chest: No acute abnormality. Hepatobiliary: No focal liver abnormality is seen. No gallstones, gallbladder wall thickening, or biliary dilatation. Pancreas: Unremarkable. No pancreatic ductal dilatation or surrounding inflammatory changes. Spleen: Normal in size without focal abnormality. Adrenals/Urinary Tract: Adrenal glands appear normal.  Horseshoe configuration of the kidneys is noted. Small nonobstructive left renal calculus is noted. No hydronephrosis or renal obstruction is noted. Urinary bladder is unremarkable. Stomach/Bowel: Stomach is within normal limits. Appendix appears normal. No evidence of bowel wall thickening, distention, or inflammatory changes. Vascular/Lymphatic: No significant vascular findings are present. No enlarged abdominal or pelvic lymph nodes. Reproductive: Prostate is unremarkable. Other: No abdominal wall hernia or abnormality. No abdominopelvic ascites.  However, 3.2 cm fluid collection is noted in the right scrotum. 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 No acute osseous abnormality is noted. IMPRESSION: Nonobstructive left renal calculus is noted. No hydronephrosis or renal obstruction is noted. 3.2 cm fluid collection is seen in the right scrotum. Abscess cannot be excluded. Scrotal ultrasound may be performed for further evaluation. Electronically Signed   By: Lupita Raider M.D.   On: 06/09/2022 10:42   DG Chest Portable 1 View  Result Date: 06/09/2022 CLINICAL DATA:  Cough, fever, GI symptoms, vomiting, diarrhea, periumbilical pain EXAM: PORTABLE CHEST 1 VIEW COMPARISON:  Portable exam 0856 hours compared to 03/04/2021 FINDINGS: Normal heart size, mediastinal contours, and pulmonary vascularity. Lungs clear. No pulmonary infiltrate, pleural effusion, or pneumothorax. Osseous structures unremarkable. IMPRESSION: No acute abnormalities. Electronically Signed   By: Ulyses Southward M.D.   On: 06/09/2022 09:06    I independently reviewed the above imaging studies.  Impression/Recommendations:  Right scrotal wall abscess  I discussed the findings on examination and on imaging with the patient.  His exam is consistent with a right scrotal wall abscess.   Will obtain scrotal ultrasound to further evaluate scrotal contents. I recommended management with incision and drainage.  I  advised the patient that this could be done under local anesthetic at the bedside.  He refused and stated that he could only have this done under anesthesia in the operating room.  I discussed the potential risk of the procedure including infection, bleeding, injury to scrotal structures including the testicle and spermatic cord, need for additional procedures, and anesthetic risk.  He understands and wishes to proceed as described.   The patient will be scheduled for incision and drainage of right scrotal wall abscess at [facility].  Surgical request is placed with the surgery schedulers and will be scheduled at the patient's/family request. Informed consent is given as documented below. Anesthesia: General  The patient does not have sleep apnea, history of MRSA, history of VRE, history of cardiac device requiring special anesthetic needs. Patient is stable and considered clear for surgical in an outpatient ambulatory surgery setting as well as patient hospital setting.  Consent for Operation or Procedure: Provider Certification I hereby certify that the nature, purpose, benefits, usual and most frequent risks of, and alternatives to, the operation or procedure have been explained to the patient (or person authorized to sign for the patient) either by me as responsible physician or by the provider who is to perform the operation or procedure. Time spent such that the patient/family has had an opportunity to ask questions, and that those questions have been answered. The patient or the patient's representative has been advised that selected tasks may be performed by assistants to the primary health care provider(s). I believe that the patient (or person authorized to sign for the patient) understands what has been explained, and has consented to the operation or procedure. No guarantees were implied or made.   Di Kindle 06/09/2022, 11:48 AM

## 2022-06-09 NOTE — Anesthesia Preprocedure Evaluation (Signed)
Anesthesia Evaluation  Patient identified by MRN, date of birth, ID band Patient awake    Reviewed: Allergy & Precautions, H&P , NPO status , Patient's Chart, lab work & pertinent test results, reviewed documented beta blocker date and time   Airway Mallampati: II  TM Distance: >3 FB Neck ROM: full    Dental no notable dental hx.    Pulmonary neg pulmonary ROS, Current Smoker and Patient abstained from smoking.,    Pulmonary exam normal breath sounds clear to auscultation       Cardiovascular Exercise Tolerance: Good negative cardio ROS   Rhythm:regular Rate:Normal     Neuro/Psych PSYCHIATRIC DISORDERS Depression negative neurological ROS     GI/Hepatic negative GI ROS, Neg liver ROS,   Endo/Other  negative endocrine ROS  Renal/GU negative Renal ROS  negative genitourinary   Musculoskeletal   Abdominal   Peds  Hematology negative hematology ROS (+)   Anesthesia Other Findings   Reproductive/Obstetrics negative OB ROS                             Anesthesia Physical Anesthesia Plan  ASA: 4 and emergent  Anesthesia Plan: General and General ETT   Post-op Pain Management:    Induction:   PONV Risk Score and Plan: Ondansetron  Airway Management Planned:   Additional Equipment:   Intra-op Plan:   Post-operative Plan:   Informed Consent: I have reviewed the patients History and Physical, chart, labs and discussed the procedure including the risks, benefits and alternatives for the proposed anesthesia with the patient or authorized representative who has indicated his/her understanding and acceptance.     Dental Advisory Given  Plan Discussed with: CRNA  Anesthesia Plan Comments:         Anesthesia Quick Evaluation

## 2022-06-09 NOTE — Anesthesia Procedure Notes (Signed)
Procedure Name: Intubation Date/Time: 06/09/2022 2:38 PM  Performed by: Karna Dupes, CRNAPre-anesthesia Checklist: Patient identified, Emergency Drugs available, Suction available and Patient being monitored Patient Re-evaluated:Patient Re-evaluated prior to induction Oxygen Delivery Method: Circle system utilized Preoxygenation: Pre-oxygenation with 100% oxygen Induction Type: IV induction Laryngoscope Size: Mac and 4 Grade View: Grade III Tube type: Oral Tube size: 7.0 mm Number of attempts: 1 Airway Equipment and Method: Stylet Placement Confirmation: ETT inserted through vocal cords under direct vision, positive ETCO2 and breath sounds checked- equal and bilateral Secured at: 22 cm Tube secured with: Tape Dental Injury: Teeth and Oropharynx as per pre-operative assessment

## 2022-06-10 DIAGNOSIS — E871 Hypo-osmolality and hyponatremia: Secondary | ICD-10-CM | POA: Diagnosis present

## 2022-06-10 DIAGNOSIS — E876 Hypokalemia: Secondary | ICD-10-CM | POA: Diagnosis present

## 2022-06-10 LAB — COMPREHENSIVE METABOLIC PANEL
ALT: 10 U/L (ref 0–44)
AST: 17 U/L (ref 15–41)
Albumin: 2.9 g/dL — ABNORMAL LOW (ref 3.5–5.0)
Alkaline Phosphatase: 50 U/L (ref 38–126)
Anion gap: 6 (ref 5–15)
BUN: 6 mg/dL (ref 6–20)
CO2: 22 mmol/L (ref 22–32)
Calcium: 8.1 mg/dL — ABNORMAL LOW (ref 8.9–10.3)
Chloride: 104 mmol/L (ref 98–111)
Creatinine, Ser: 0.83 mg/dL (ref 0.61–1.24)
GFR, Estimated: >60 mL/min (ref >60)
Glucose, Bld: 87 mg/dL (ref 70–99)
Potassium: 3.1 mmol/L — ABNORMAL LOW (ref 3.5–5.1)
Sodium: 132 mmol/L — ABNORMAL LOW (ref 135–145)
Total Bilirubin: 0.7 mg/dL (ref 0.3–1.2)
Total Protein: 7.2 g/dL (ref 6.5–8.1)

## 2022-06-10 LAB — CBC
HCT: 35.1 % — ABNORMAL LOW (ref 39.0–52.0)
Hemoglobin: 11.3 g/dL — ABNORMAL LOW (ref 13.0–17.0)
MCH: 26.1 pg (ref 26.0–34.0)
MCHC: 32.2 g/dL (ref 30.0–36.0)
MCV: 81.1 fL (ref 80.0–100.0)
Platelets: 163 10*3/uL (ref 150–400)
RBC: 4.33 MIL/uL (ref 4.22–5.81)
RDW: 12.3 % (ref 11.5–15.5)
WBC: 8.1 10*3/uL (ref 4.0–10.5)
nRBC: 0 % (ref 0.0–0.2)

## 2022-06-10 LAB — URINALYSIS, ROUTINE W REFLEX MICROSCOPIC
Bilirubin Urine: NEGATIVE
Glucose, UA: NEGATIVE mg/dL
Ketones, ur: 20 mg/dL — AB
Leukocytes,Ua: NEGATIVE
Nitrite: NEGATIVE
Protein, ur: 30 mg/dL — AB
RBC / HPF: >50 RBC/hpf — ABNORMAL HIGH (ref 0–5)
Specific Gravity, Urine: 1.018 (ref 1.005–1.030)
pH: 6 (ref 5.0–8.0)

## 2022-06-10 LAB — MAGNESIUM: Magnesium: 1.7 mg/dL (ref 1.7–2.4)

## 2022-06-10 MED ORDER — AMOXICILLIN-POT CLAVULANATE 875-125 MG PO TABS
1.0000 | ORAL_TABLET | Freq: Two times a day (BID) | ORAL | Status: DC
  Administered 2022-06-10 – 2022-06-11 (×2): 1 via ORAL
  Filled 2022-06-10 (×2): qty 1

## 2022-06-10 MED ORDER — DOXYCYCLINE HYCLATE 100 MG PO TABS
100.0000 mg | ORAL_TABLET | Freq: Two times a day (BID) | ORAL | Status: DC
  Administered 2022-06-10 – 2022-06-11 (×2): 100 mg via ORAL
  Filled 2022-06-10 (×2): qty 1

## 2022-06-10 MED ORDER — SODIUM CHLORIDE 0.9 % IV SOLN: 250.0000 mL | INTRAVENOUS | Status: DC | PRN

## 2022-06-10 MED ORDER — POTASSIUM CHLORIDE CRYS ER 20 MEQ PO TBCR
40.0000 meq | EXTENDED_RELEASE_TABLET | ORAL | Status: AC
  Administered 2022-06-10 – 2022-06-11 (×3): 40 meq via ORAL
  Filled 2022-06-10 (×3): qty 2

## 2022-06-10 NOTE — Assessment & Plan Note (Signed)
-  Continue home antiretroviral medications. -Continue outpatient follow-up with infectious disease service.

## 2022-06-10 NOTE — Assessment & Plan Note (Signed)
-   Cessation counseling provided. 

## 2022-06-10 NOTE — Progress Notes (Signed)
Progress Note   Patient: Julian Sanchez QMV:[id number] DOB: Apr 22, 1996 DOA: 06/09/2022     0 DOS: the patient was seen and examined on 06/10/2022   Brief hospital admission course: As per H&P written by Dr. Sherryll Burger on 06/09/2022 Julian Sanchez is a 26 y.o. male with medical history significant for HIV, tobacco abuse, prior scrotal abscess, and marijuana use who presented to the ED with complaints of mild fever as well as some nausea and vomiting as well as diarrhea over the last few days.  He has also been complaining of generalized abdominal pain and was noted to have a boil on his scrotum which was quite concerning.  He is noted to have a prior scrotal abscess and became concerned about this and therefore presented to the ED.  As of late, he has been using more marijuana.  He denies any chest pain or shortness of breath.   ED Course: Vital signs with temperature 100.9 F, leukocytosis 12,800, potassium 3, sodium 132.  CT abdomen pelvis with nonobstructing renal calculus and was notable for fluid collection in the scrotal region for which follow-up ultrasound was performed demonstrating right-sided scrotal wall abscess.  Patient was seen by urology with recommendations for bedside drainage, however patient felt uncomfortable with this and prefers OR.  He has been started on Rocephin and vancomycin.  Assessment and Plan: * Scrotal abscess -Status post I&D -Receiving IV antibiotics while awaiting culture results; based on overall improvement we will attempt transition to Augmentin and doxycycline -So far wound culture with gram-positive and negative rods; and also gram-positive cocci; final speciation and sensitivity pending. -Continue as needed analgesics and supportive care. -Follow wound care as per urology service recommendation.  Hyponatremia - In the setting of dehydration most likely -Continue fluid resuscitation and electrolyte repletion.  Hypokalemia - Continue electrolyte repletion and  follow trend. -Magnesium within normal limits.  Marijuana use - Cessation counseling provided.  Cigarette smoker - Cessation counseling provided -Patient has declined the use of nicotine patch  HIV disease (HCC) - Continue home antiretroviral medications.    Subjective:  Afebrile, no chest pain, shortness of breath, nausea or vomiting.  Reports having pain on his right scrotal area.  Physical Exam: Vitals:   06/09/22 2039 06/10/22 0100 06/10/22 0421 06/10/22 1442  BP: 107/68 104/60 96/61 (!) 131/92  Pulse: 96 82 80 75  Resp:    18  Temp: 99.5 F (37.5 C) 99.9 F (37.7 C) 99.2 F (37.3 C) 98.8 F (37.1 C)  TempSrc: Oral Oral Oral Oral  SpO2: 100% 100% 100% 100%  Weight:      Height:       General exam: Alert, awake, oriented x 3; currently afebrile and complaining of pain on his right scrotal area.  No shortness of breath, nausea or vomiting reported. Respiratory system: Clear to auscultation. Respiratory effort normal.  Good saturation on room air. Cardiovascular system:RRR. No murmurs, rubs, gallops.  No JVD. Gastrointestinal system: Abdomen is nondistended, soft and nontender. No organomegaly or masses felt. Normal bowel sounds heard. Central nervous system: Alert and oriented. No focal neurological deficits. Extremities: No cyanosis or clubbing. Skin: No pack open wound with serosanguineous drainage appreciated on his right scrotal area; mild induration and tenderness on palpation appreciated. Psychiatry: Judgement and insight appear normal. Mood & affect appropriate.   Data Reviewed: Magnesium 1.7 Comprehensive metabolic panel with a sodium of 132, potassium 3.1, chloride 104, BUN 6, creatinine 0.83, albumin 2.9 and normal LFTs CBC: WBCs 8.1, hemoglobin 11.3 and platelets  count 163 K.   Family Communication: No family at bedside.  Disposition: Status is: Inpatient Remains inpatient appropriate because: Still awaiting culture results and received IV  antibiotics; looking for transition to oral route while continue analgesic therapy and further recommendations by urology service.   Planned Discharge Destination: Home   Author: Vassie Loll, MD 06/10/2022 4:40 PM  For on call review [email or web address].

## 2022-06-10 NOTE — Assessment & Plan Note (Signed)
-  Repleted and within normal limits at discharge. -Magnesium within normal limits.

## 2022-06-10 NOTE — Assessment & Plan Note (Addendum)
-  Status post I&D -Receiving IV antibiotics while awaiting culture results; based on overall improvement we will attempt transition to Augmentin and doxycycline -So far wound culture with gram-positive and negative rods; and also gram-positive cocci; final speciation and sensitivity pending. -Continue as needed analgesics and supportive care. -Follow wound care as per urology service recommendation.

## 2022-06-10 NOTE — Assessment & Plan Note (Signed)
-  In the setting of dehydration most likely -Improving within normal limits at time of discharge -Will recommend repeat basic metabolic panel follow-up visit to assess stability.Marland Kitchen

## 2022-06-10 NOTE — Progress Notes (Signed)
Urology Inpatient Progress Note  Subjective: POD #1 s/p I&D of scrotal wall abscess.  Minimal pain.   WBC normal today.  Afebrile today. Wound culture:  gm + cocci, gm + rods, Gm - rods  Anti-infectives: Anti-infectives (From admission, onward)    Start     Dose/Rate Route Frequency Ordered Stop   06/10/22 1000  bictegravir-emtricitabine-tenofovir AF (BIKTARVY) 50-200-25 MG per tablet 1 tablet        1 tablet Oral Daily 06/09/22 1326     06/10/22 0000  vancomycin (VANCOREADY) IVPB 1250 mg/250 mL        1,250 mg 166.7 mL/hr over 90 Minutes Intravenous Every 12 hours 06/09/22 1254     06/09/22 1130  vancomycin (VANCOCIN) IVPB 1000 mg/200 mL premix  Status:  Discontinued        1,000 mg 200 mL/hr over 60 Minutes Intravenous  Once 06/09/22 1118 06/09/22 1122   06/09/22 1130  cefTRIAXone (ROCEPHIN) 2 g in sodium chloride 0.9 % 100 mL IVPB        2 g 200 mL/hr over 30 Minutes Intravenous  Once 06/09/22 1118 06/09/22 1236   06/09/22 1130  vancomycin (VANCOREADY) IVPB 1250 mg/250 mL        1,250 mg 166.7 mL/hr over 90 Minutes Intravenous  Once 06/09/22 1122 06/09/22 1726       Current Facility-Administered Medications  Medication Dose Route Frequency Provider Last Rate Last Admin   0.9 %  sodium chloride infusion  250 mL Intravenous PRN Avyonna Wagoner, Reece Leader., MD       acetaminophen (TYLENOL) tablet 650 mg  650 mg Oral Q6H PRN Primus Bravo., MD   650 mg at 06/09/22 1527   Or   acetaminophen (TYLENOL) suppository 650 mg  650 mg Rectal Q6H PRN Poet Hineman, Reece Leader., MD       bictegravir-emtricitabine-tenofovir AF (BIKTARVY) 50-200-25 MG per tablet 1 tablet  1 tablet Oral Daily Cavan Bearden, Reece Leader., MD   1 tablet at 06/10/22 1039   enoxaparin (LOVENOX) injection 40 mg  40 mg Subcutaneous Q24H Waymond Meador, Reece Leader., MD       ondansetron [facility]) tablet 4 mg  4 mg Oral Q6H PRN Braniyah Besse, Reece Leader., MD       Or   ondansetron [facility]) injection 4 mg  4 mg Intravenous Q6H PRN  Laneice Meneely, Reece Leader., MD       oxyCODONE (Oxy IR/ROXICODONE) immediate release tablet 5 mg  5 mg Oral Q4H PRN Primus Bravo., MD   5 mg at 06/09/22 2342   potassium chloride SA (KLOR-CON M) CR tablet 40 mEq  40 mEq Oral Once Primus Bravo., MD       sodium chloride flush (NS) 0.9 % injection 3 mL  3 mL Intravenous Q12H Ellenore Roscoe, Reece Leader., MD   3 mL at 06/10/22 1041   sodium chloride flush (NS) 0.9 % injection 3 mL  3 mL Intravenous PRN Primus Bravo., MD   3 mL at 06/10/22 1216   vancomycin (VANCOREADY) IVPB 1250 mg/250 mL  1,250 mg Intravenous Q12H Primus Bravo., MD 166.7 mL/hr at 06/10/22 1218 1,250 mg at 06/10/22 1218     Objective: Vital signs in last 24 hours: Temp:  [99.2 F (37.3 C)-102.5 F (39.2 C)] 99.2 F (37.3 C) (08/30 0421) Pulse Rate:  [80-100] 80 (08/30 0421) Resp:  [10-19] 18 (08/29 1606) BP: (96-115)/(60-78) 96/61 (08/30 0421) SpO2:  [96 %-100 %] 100 % (08/30 0421) Weight:  [64.7 kg-65.7 kg] 64.7 kg (08/29 1606)  Intake/Output from previous day: 08/29 0701 - 08/30 0700 In: 1042.6 [I.V.:900; IV Piggyback:142.6] Out: 5 [Blood:5] Intake/Output this shift: No intake/output data recorded.  GENERAL APPEARANCE:  Well appearing, well developed, well nourished, NAD HEENT:  Atraumatic, normocephalic, oropharynx clear ABDOMEN:  Soft, non-tender, no masses EXTREMITIES:  Moves all extremities well, without clubbing, cyanosis, or edema NEUROLOGIC:  Alert and oriented x 3, CN II-XII grossly intact MENTAL STATUS:  appropriate BACK:  Non-tender to palpation, No CVAT SKIN:  Warm, dry, and intact GU:  scrotum with packing in place, minimal serosanguinous drainage; decreased edema   Lab Results:  Recent Labs    06/09/22 0854 06/10/22 0404  WBC 12.8* 8.1  HGB 12.5* 11.3*  HCT 37.3* 35.1*  PLT 203 163   BMET Recent Labs    06/09/22 0854 06/10/22 0404  NA 132* 132*  K 3.0* 3.1*  CL 103 104  CO2 20* 22  GLUCOSE 128* 87  BUN 12 6   CREATININE 0.91 0.83  CALCIUM 8.8* 8.1*   PT/INR Recent Labs    06/09/22 1305  LABPROT 14.6  INR 1.2   ABG No results for input(s): "PHART", "HCO3" in the last 72 hours.  Invalid input(s): "PCO2", "PO2"  Studies/Results: US SCROTUM W/DOPPLER  Result Date: 06/09/2022 CLINICAL DATA:  Acute right testicular pain. EXAM: SCROTAL ULTRASOUND DOPPLER ULTRASOUND OF THE TESTICLES TECHNIQUE: Complete ultrasound examination of the testicles, epididymis, and other scrotal structures was performed. Color and spectral Doppler ultrasound were also utilized to evaluate blood flow to the testicles. COMPARISON:  CT scan of same day.  Ultrasound of Mar 05, 2021. FINDINGS: Right testicle Measurements: 3.8 x 2.3 x 1.8 cm. No mass or microlithiasis visualized. Left testicle Measurements: 3.8 x 2.4 x 1.7 cm. No mass or microlithiasis visualized. Right epididymis:  5 mm cyst is noted. Left epididymis:  4 mm cyst is noted. Hydrocele:  Small bilateral hydroceles are noted. Varicocele:  None visualized. Pulsed Doppler interrogation of both testes demonstrates normal low resistance arterial and venous waveforms bilaterally. Rounded complex abnormality measuring 3.6 x 2.5 x 2.4 cm is seen laterally in the right side of the scrotum. This most likely corresponds to abnormality seen on CT scan of same day. This is concerning for either abscess, hematoma or possibly mass. IMPRESSION: No evidence of testicular mass or torsion. 3.6 x 2.5 x 2.4 cm rounded complex abnormality is noted laterally in the right side of the scrotum which most likely corresponds to abnormality seen on CT scan of same day. This is concerning for either abscess, hematoma or possibly mass. Electronically Signed   By: Lupita Raider M.D.   On: 06/09/2022 12:50   CT ABDOMEN PELVIS W CONTRAST  Result Date: 06/09/2022 CLINICAL DATA:  Acute generalized abdominal pain, nausea, vomiting. Sepsis. EXAM: CT ABDOMEN AND PELVIS WITH CONTRAST TECHNIQUE: Multidetector  CT imaging of the abdomen and pelvis was performed using the standard protocol following bolus administration of intravenous contrast. RADIATION DOSE REDUCTION: This exam was performed according to the departmental dose-optimization program which includes automated exposure control, adjustment of the mA and/or kV according to patient size and/or use of iterative reconstruction technique. CONTRAST:  OMNIPAQUE IOHEXOL 300 MG/ML  SOLN COMPARISON:  December 14, 2021.  Mar 05, 2021. FINDINGS: Lower chest: No acute abnormality. Hepatobiliary: No focal liver abnormality is seen. No gallstones, gallbladder wall thickening, or biliary dilatation. Pancreas: Unremarkable. No pancreatic ductal dilatation or surrounding inflammatory changes. Spleen: Normal in size without focal abnormality. Adrenals/Urinary Tract: Adrenal glands appear normal. Horseshoe  configuration of the kidneys is noted. Small nonobstructive left renal calculus is noted. No hydronephrosis or renal obstruction is noted. Urinary bladder is unremarkable. Stomach/Bowel: Stomach is within normal limits. Appendix appears normal. No evidence of bowel wall thickening, distention, or inflammatory changes. Vascular/Lymphatic: No significant vascular findings are present. No enlarged abdominal or pelvic lymph nodes. Reproductive: Prostate is unremarkable. Other: No abdominal wall hernia or abnormality. No abdominopelvic ascites. However, 3.2 cm fluid collection is noted in the right scrotum. 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 No acute osseous abnormality is noted. IMPRESSION: Nonobstructive left renal calculus is noted. No hydronephrosis or renal obstruction is noted. 3.2 cm fluid collection is seen in the right scrotum. Abscess cannot be excluded. Scrotal ultrasound may be performed for further evaluation. Electronically Signed   By: Lupita Raider M.D.   On: 06/09/2022 10:42   DG Chest Portable 1 View  Result Date:  06/09/2022 CLINICAL DATA:  Cough, fever, GI symptoms, vomiting, diarrhea, periumbilical pain EXAM: PORTABLE CHEST 1 VIEW COMPARISON:  Portable exam 0856 hours compared to 03/04/2021 FINDINGS: Normal heart size, mediastinal contours, and pulmonary vascularity. Lungs clear. No pulmonary infiltrate, pleural effusion, or pneumothorax. Osseous structures unremarkable. IMPRESSION: No acute abnormalities. Electronically Signed   By: Ulyses Southward M.D.   On: 06/09/2022 09:06     Assessment & Plan: Right scrotal wall abscess  POD #1 s/p I&D.  Doing well. Await culture results Begin dressing changes tomorrow Disposition per Medicine   Di Kindle, MD 06/10/2022

## 2022-06-10 NOTE — Assessment & Plan Note (Signed)
-  Cessation counseling provided. ?-Patient has declined the use of nicotine patch. ?

## 2022-06-11 DIAGNOSIS — B20 Human immunodeficiency virus [HIV] disease: Secondary | ICD-10-CM

## 2022-06-11 DIAGNOSIS — F1290 Cannabis use, unspecified, uncomplicated: Secondary | ICD-10-CM

## 2022-06-11 DIAGNOSIS — F17210 Nicotine dependence, cigarettes, uncomplicated: Secondary | ICD-10-CM

## 2022-06-11 DIAGNOSIS — K219 Gastro-esophageal reflux disease without esophagitis: Secondary | ICD-10-CM

## 2022-06-11 DIAGNOSIS — E876 Hypokalemia: Secondary | ICD-10-CM

## 2022-06-11 DIAGNOSIS — E871 Hypo-osmolality and hyponatremia: Secondary | ICD-10-CM

## 2022-06-11 LAB — BASIC METABOLIC PANEL
Anion gap: 4 — ABNORMAL LOW (ref 5–15)
BUN: 6 mg/dL (ref 6–20)
CO2: 28 mmol/L (ref 22–32)
Calcium: 8.7 mg/dL — ABNORMAL LOW (ref 8.9–10.3)
Chloride: 104 mmol/L (ref 98–111)
Creatinine, Ser: 0.75 mg/dL (ref 0.61–1.24)
GFR, Estimated: >60 mL/min (ref >60)
Glucose, Bld: 81 mg/dL (ref 70–99)
Potassium: 3.9 mmol/L (ref 3.5–5.1)
Sodium: 136 mmol/L (ref 135–145)

## 2022-06-11 MED ORDER — POLYETHYLENE GLYCOL 3350 17 G PO PACK: 17.0000 g | PACK | Freq: Every day | ORAL | 1 refills | Status: DC | PRN

## 2022-06-11 MED ORDER — IBUPROFEN 800 MG PO TABS: 800.0000 mg | ORAL_TABLET | Freq: Three times a day (TID) | ORAL | 0 refills | Status: DC | PRN

## 2022-06-11 MED ORDER — AMOXICILLIN-POT CLAVULANATE 875-125 MG PO TABS: 1.0000 | ORAL_TABLET | Freq: Two times a day (BID) | ORAL | 0 refills | Status: AC

## 2022-06-11 MED ORDER — PANTOPRAZOLE SODIUM 40 MG PO TBEC: 40.0000 mg | DELAYED_RELEASE_TABLET | Freq: Every day | ORAL | 1 refills | Status: DC

## 2022-06-11 MED ORDER — OXYCODONE HCL 5 MG PO TABS: 5.0000 mg | ORAL_TABLET | Freq: Four times a day (QID) | ORAL | 0 refills | Status: DC | PRN

## 2022-06-11 MED ORDER — DOXYCYCLINE HYCLATE 100 MG PO TABS: 100.0000 mg | ORAL_TABLET | Freq: Two times a day (BID) | ORAL | 0 refills | Status: AC

## 2022-06-11 NOTE — Discharge Instructions (Signed)
Change wound packing with iodoform gauze daily

## 2022-06-11 NOTE — Discharge Summary (Signed)
Physician Discharge Summary   Patient: Julian Sanchez MRN: [id number] DOB: 10-07-1996  Admit date:     06/09/2022  Discharge date: 06/11/22  Discharge Physician: Barton Dubois   PCP: Patient, No Pcp Per   Recommendations at discharge:  Repeat basic metabolic panel to follow electrolytes and renal function Repeat CBC to follow WBCs/hemoglobin stability and trend. Make sure patient has follow-up with urology service for further care regarding scrotal abscess and wound assessment. Continue assisting patient with smoking cessation.  Discharge Diagnoses: Principal Problem:   Scrotal abscess Active Problems:   HIV disease ([place])   Cigarette smoker   Marijuana use   Hypokalemia   Hyponatremia   Gastroesophageal reflux disease  Brief Hospital admission Course: As per H&P written by Dr. Manuella Ghazi on 06/09/2022 Julian Sanchez is a 26 y.o. male with medical history significant for HIV, tobacco abuse, prior scrotal abscess, and marijuana use who presented to the ED with complaints of mild fever as well as some nausea and vomiting as well as diarrhea over the last few days.  He has also been complaining of generalized abdominal pain and was noted to have a boil on his scrotum which was quite concerning.  He is noted to have a prior scrotal abscess and became concerned about this and therefore presented to the ED.  As of late, he has been using more marijuana.  He denies any chest pain or shortness of breath.   ED Course: Vital signs with temperature 100.9 F, leukocytosis 12,800, potassium 3, sodium 132.  CT abdomen pelvis with nonobstructing renal calculus and was notable for fluid collection in the scrotal region for which follow-up ultrasound was performed demonstrating right-sided scrotal wall abscess.  Patient was seen by urology with recommendations for bedside drainage, however patient felt uncomfortable with this and prefers OR.  He has been started on Rocephin and vancomycin.  Assessment and  Plan: * Scrotal abscess -Status post I&D -Patient reported improvement in his overall scrotal wound and expressed pain to be controlled. -No fever, normal WBCs and cleared for discharge by urology service. -Urology will follow patient in 1 week; recommending daily wound care with iodoform gauze changes. -Discharged on oral Augmentin and doxycycline to complete 10 more days of antibiotics. -As needed ibuprofen and oxycodone For severe pain described.  Gastroesophageal reflux disease -Discharged on PPI daily. -Lifestyle changes discussed with patient.  Hyponatremia -In the setting of dehydration most likely -Improving within normal limits at time of discharge -Will recommend repeat basic metabolic panel follow-up visit to assess stability..  Hypokalemia -Repleted and within normal limits at discharge. -Magnesium within normal limits.  Marijuana use -Cessation counseling provided.  Cigarette smoker -Cessation counseling provided -Patient has declined the use of nicotine patch.  HIV disease ([place]) -Continue home antiretroviral medications. -Continue outpatient follow-up with infectious disease service.   Consultants: Urology service Procedures performed: See below for x-ray report; status post I&D for scrotal abscess on 06/09/2022. Disposition: Home Diet recommendation: Regular diet.  DISCHARGE MEDICATION: Allergies as of 06/11/2022   No Known Allergies      Medication List     STOP taking these medications    erythromycin ophthalmic ointment       TAKE these medications    amoxicillin-clavulanate 875-125 MG tablet Commonly known as: AUGMENTIN Take 1 tablet by mouth every 12 (twelve) hours for 10 days.   Biktarvy 50-200-25 MG Tabs tablet Generic drug: bictegravir-emtricitabine-tenofovir AF Take 1 tablet by mouth daily.   doxycycline 100 MG tablet Commonly known as: VIBRA-TABS  Take 1 tablet (100 mg total) by mouth every 12 (twelve) hours for 10 days.    ibuprofen 800 MG tablet Commonly known as: ADVIL Take 1 tablet (800 mg total) by mouth every 8 (eight) hours as needed for moderate pain or fever.   oxyCODONE 5 MG immediate release tablet Commonly known as: Roxicodone Take 1 tablet (5 mg total) by mouth every 6 (six) hours as needed for severe pain. What changed: when to take this   pantoprazole 40 MG tablet Commonly known as: Protonix Take 1 tablet (40 mg total) by mouth daily.   polyethylene glycol 17 g packet Commonly known as: MIRALAX / GLYCOLAX Take 17 g by mouth daily as needed for mild constipation.               Discharge Care Instructions  (From admission, onward)           Start     Ordered   06/11/22 0000  Discharge wound care:       Comments: Daily wound care using iodoform gauze; keep area clean and dry.  Follow-up with urology service in 1 week for further recommendations.   06/11/22 2703            Follow-up Information     Stone[place], Danford Bad., MD Follow up in 1 week(s).   Specialty: Urology Contact information: [address] [place] [address] [phone]                Discharge Exam: Filed Weights   06/09/22 0820 06/09/22 1338 06/09/22 1606  Weight: 65.8 kg 65.7 kg 64.7 kg   General exam: Alert, awake, oriented x 3; currently afebrile and reporting pain to be well controlled.  Overall scrotal wound demonstrating decreasing duration and no significant drainage.  Patient feeling ready to go home.   Respiratory system: Clear to auscultation. Respiratory effort normal.  Good saturation on room air. Cardiovascular system:RRR. No murmurs, rubs, gallops.  No JVD. Gastrointestinal system: Abdomen is nondistended, soft and nontender. No organomegaly or masses felt. Normal bowel sounds heard. Central nervous system: Alert and oriented. No focal neurological deficits. Extremities: No cyanosis or clubbing. Skin: No significant drainage out of scrotal wound seen; packing  in place.  Decreased induration appreciated. Psychiatry: Judgement and insight appear normal. Mood & affect appropriate.   Condition at discharge: Stable and improved.  The results of significant diagnostics from this hospitalization (including imaging, microbiology, ancillary and laboratory) are listed below for reference.   Imaging Studies: US SCROTUM W/DOPPLER  Result Date: 06/09/2022 CLINICAL DATA:  Acute right testicular pain. EXAM: SCROTAL ULTRASOUND DOPPLER ULTRASOUND OF THE TESTICLES TECHNIQUE: Complete ultrasound examination of the testicles, epididymis, and other scrotal structures was performed. Color and spectral Doppler ultrasound were also utilized to evaluate blood flow to the testicles. COMPARISON:  CT scan of same day.  Ultrasound of Mar 05, 2021. FINDINGS: Right testicle Measurements: 3.8 x 2.3 x 1.8 cm. No mass or microlithiasis visualized. Left testicle Measurements: 3.8 x 2.4 x 1.7 cm. No mass or microlithiasis visualized. Right epididymis:  5 mm cyst is noted. Left epididymis:  4 mm cyst is noted. Hydrocele:  Small bilateral hydroceles are noted. Varicocele:  None visualized. Pulsed Doppler interrogation of both testes demonstrates normal low resistance arterial and venous waveforms bilaterally. Rounded complex abnormality measuring 3.6 x 2.5 x 2.4 cm is seen laterally in the right side of the scrotum. This most likely corresponds to abnormality seen on CT scan of same day. This is concerning  for either abscess, hematoma or possibly mass. IMPRESSION: No evidence of testicular mass or torsion. 3.6 x 2.5 x 2.4 cm rounded complex abnormality is noted laterally in the right side of the scrotum which most likely corresponds to abnormality seen on CT scan of same day. This is concerning for either abscess, hematoma or possibly mass. Electronically Signed   By: Lupita Raider M.D.   On: 06/09/2022 12:50   CT ABDOMEN PELVIS W CONTRAST  Result Date: 06/09/2022 CLINICAL DATA:  Acute  generalized abdominal pain, nausea, vomiting. Sepsis. EXAM: CT ABDOMEN AND PELVIS WITH CONTRAST TECHNIQUE: Multidetector CT imaging of the abdomen and pelvis was performed using the standard protocol following bolus administration of intravenous contrast. RADIATION DOSE REDUCTION: This exam was performed according to the departmental dose-optimization program which includes automated exposure control, adjustment of the mA and/or kV according to patient size and/or use of iterative reconstruction technique. CONTRAST:  OMNIPAQUE IOHEXOL 300 MG/ML  SOLN COMPARISON:  December 14, 2021.  Mar 05, 2021. FINDINGS: Lower chest: No acute abnormality. Hepatobiliary: No focal liver abnormality is seen. No gallstones, gallbladder wall thickening, or biliary dilatation. Pancreas: Unremarkable. No pancreatic ductal dilatation or surrounding inflammatory changes. Spleen: Normal in size without focal abnormality. Adrenals/Urinary Tract: Adrenal glands appear normal. Horseshoe configuration of the kidneys is noted. Small nonobstructive left renal calculus is noted. No hydronephrosis or renal obstruction is noted. Urinary bladder is unremarkable. Stomach/Bowel: Stomach is within normal limits. Appendix appears normal. No evidence of bowel wall thickening, distention, or inflammatory changes. Vascular/Lymphatic: No significant vascular findings are present. No enlarged abdominal or pelvic lymph nodes. Reproductive: Prostate is unremarkable. Other: No abdominal wall hernia or abnormality. No abdominopelvic ascites. However, 3.2 cm fluid collection is noted in the right scrotum. 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 No acute osseous abnormality is noted. IMPRESSION: Nonobstructive left renal calculus is noted. No hydronephrosis or renal obstruction is noted. 3.2 cm fluid collection is seen in the right scrotum. Abscess cannot be excluded. Scrotal ultrasound may be performed for further evaluation.  Electronically Signed   By: Lupita Raider M.D.   On: 06/09/2022 10:42   DG Chest Portable 1 View  Result Date: 06/09/2022 CLINICAL DATA:  Cough, fever, GI symptoms, vomiting, diarrhea, periumbilical pain EXAM: PORTABLE CHEST 1 VIEW COMPARISON:  Portable exam 0856 hours compared to 03/04/2021 FINDINGS: Normal heart size, mediastinal contours, and pulmonary vascularity. Lungs clear. No pulmonary infiltrate, pleural effusion, or pneumothorax. Osseous structures unremarkable. IMPRESSION: No acute abnormalities. Electronically Signed   By: Ulyses Southward M.D.   On: 06/09/2022 09:06    Microbiology: Results for orders placed or performed during the hospital encounter of 06/09/22  Resp Panel by RT-PCR (Flu A&B, Covid) Anterior Nasal Swab     Status: None   Collection Time: 06/09/22  8:54 AM   Specimen: Anterior Nasal Swab  Result Value Ref Range Status   SARS Coronavirus 2 by RT PCR NEGATIVE NEGATIVE Final    Comment: (NOTE) SARS-CoV-2 target nucleic acids are NOT DETECTED.  The SARS-CoV-2 RNA is generally detectable in upper respiratory specimens during the acute phase of infection. The lowest concentration of SARS-CoV-2 viral copies this assay can detect is 138 copies/mL. A negative result does not preclude SARS-Cov-2 infection and should not be used as the sole basis for treatment or other patient management decisions. A negative result may occur with  improper specimen collection/handling, submission of specimen other than nasopharyngeal swab, presence of viral mutation(s) within the areas targeted by  this assay, and inadequate number of viral copies(<138 copies/mL). A negative result must be combined with clinical observations, patient history, and epidemiological information. The expected result is Negative.  Fact Sheet for Patients:  EntrepreneurPulse.com.au  Fact Sheet for Healthcare Providers:  IncredibleEmployment.be  This test is no t yet  approved or cleared by the [place] FDA and  has been authorized for detection and/or diagnosis of SARS-CoV-2 by FDA under an Emergency Use Authorization (EUA). This EUA will remain  in effect (meaning this test can be used) for the duration of the COVID-19 declaration under Section 564(b)(1) of the Act, 21 U.S.C.section 360bbb-3(b)(1), unless the authorization is terminated  or revoked sooner.       Influenza A by PCR NEGATIVE NEGATIVE Final   Influenza B by PCR NEGATIVE NEGATIVE Final    Comment: (NOTE) The Xpert Xpress SARS-CoV-2/FLU/RSV plus assay is intended as an aid in the diagnosis of influenza from Nasopharyngeal swab specimens and should not be used as a sole basis for treatment. Nasal washings and aspirates are unacceptable for Xpert Xpress SARS-CoV-2/FLU/RSV testing.  Fact Sheet for Patients: EntrepreneurPulse.com.au  Fact Sheet for Healthcare Providers: IncredibleEmployment.be  This test is not yet approved or cleared by the [place] FDA and has been authorized for detection and/or diagnosis of SARS-CoV-2 by FDA under an Emergency Use Authorization (EUA). This EUA will remain in effect (meaning this test can be used) for the duration of the COVID-19 declaration under Section 564(b)(1) of the Act, 21 U.S.C. section 360bbb-3(b)(1), unless the authorization is terminated or revoked.  Performed at [facility], [address], [place], [place] [address]   Blood Culture (routine x 2)     Status: None (Preliminary result)   Collection Time: 06/09/22 11:18 AM   Specimen: BLOOD RIGHT ARM  Result Value Ref Range Status   Specimen Description BLOOD RIGHT ARM  Final   Special Requests   Final    BOTTLES DRAWN AEROBIC AND ANAEROBIC Blood Culture results may not be optimal due to an excessive volume of blood received in culture bottles   Culture   Final    NO GROWTH 2 DAYS Performed at [facility], [address],  [place], [place] [address]    Report Status PENDING  Incomplete  Blood Culture (routine x 2)     Status: None (Preliminary result)   Collection Time: 06/09/22 11:23 AM   Specimen: BLOOD LEFT ARM  Result Value Ref Range Status   Specimen Description BLOOD LEFT ARM  Final   Special Requests   Final    BOTTLES DRAWN AEROBIC AND ANAEROBIC Blood Culture results may not be optimal due to an excessive volume of blood received in culture bottles   Culture   Final    NO GROWTH 2 DAYS Performed at [facility], [address] [place], [place] [address]    Report Status PENDING  Incomplete  Aerobic/Anaerobic Culture w Gram Stain (surgical/deep wound)     Status: None (Preliminary result)   Collection Time: 06/09/22  3:09 PM   Specimen: Abscess  Result Value Ref Range Status   Specimen Description   Final    ABSCESS Performed at [facility], [address], [place], [place] [address]    Special Requests   Final    NONE Performed at [facility], [address], [place],  [address]    Gram Stain   Final    FEW GRAM POSITIVE COCCI IN PAIRS FEW GRAM POSITIVE RODS MODERATE GRAM NEGATIVE RODS FEW WBC  PRESENT, PREDOMINANTLY PMN    Culture   Final    NO GROWTH 2 DAYS Performed at [facility], [place] [address] [place], [place] [address]    Report Status PENDING  Incomplete    Labs: CBC: Recent Labs  Lab 06/09/22 0854 06/10/22 0404  WBC 12.8* 8.1  HGB 12.5* 11.3*  HCT 37.3* 35.1*  MCV 77.9* 81.1  PLT 203 [id number]   Basic Metabolic Panel: Recent Labs  Lab 06/09/22 0854 06/10/22 0404 06/11/22 0428  NA 132* 132* 136  K 3.0* 3.1* 3.9  CL 103 104 104  CO2 20* 22 28  GLUCOSE 128* 87 81  BUN 12 6 6   CREATININE 0.91 0.83 0.75  CALCIUM 8.8* 8.1* 8.7*  MG  --  1.7  --    Liver Function Tests: Recent Labs  Lab 06/09/22 0854 06/10/22 0404  AST 20 17  ALT 10 10  ALKPHOS 64 50  BILITOT 0.8 0.7  PROT 8.8* 7.2  ALBUMIN 3.7 2.9*   CBG: No results for input(s): "GLUCAP" in  the last 168 hours.  Discharge time spent: greater than 30 minutes.  Signed: Barton Dubois, MD [facility] 06/11/2022

## 2022-06-11 NOTE — Progress Notes (Signed)
Urology Inpatient Progress Note  Subjective: POD #2 s/p I&D of scrotal wall abscess.   Afebrile.  Pain controlled.  Anti-infectives: Anti-infectives (From admission, onward)    Start     Dose/Rate Route Frequency Ordered Stop   06/10/22 2200  amoxicillin-clavulanate (AUGMENTIN) 875-125 MG per tablet 1 tablet        1 tablet Oral Every 12 hours 06/10/22 1639     06/10/22 2200  doxycycline (VIBRA-TABS) tablet 100 mg        100 mg Oral Every 12 hours 06/10/22 1639     06/10/22 1000  bictegravir-emtricitabine-tenofovir AF (BIKTARVY) 50-200-25 MG per tablet 1 tablet        1 tablet Oral Daily 06/09/22 1326     06/10/22 0000  vancomycin (VANCOREADY) IVPB 1250 mg/250 mL  Status:  Discontinued        1,250 mg 166.7 mL/hr over 90 Minutes Intravenous Every 12 hours 06/09/22 1254 06/10/22 1639   06/09/22 1130  vancomycin (VANCOCIN) IVPB 1000 mg/200 mL premix  Status:  Discontinued        1,000 mg 200 mL/hr over 60 Minutes Intravenous  Once 06/09/22 1118 06/09/22 1122   06/09/22 1130  cefTRIAXone (ROCEPHIN) 2 g in sodium chloride 0.9 % 100 mL IVPB        2 g 200 mL/hr over 30 Minutes Intravenous  Once 06/09/22 1118 06/09/22 1236   06/09/22 1130  vancomycin (VANCOREADY) IVPB 1250 mg/250 mL        1,250 mg 166.7 mL/hr over 90 Minutes Intravenous  Once 06/09/22 1122 06/09/22 1726       Current Facility-Administered Medications  Medication Dose Route Frequency Provider Last Rate Last Admin   0.9 %  sodium chloride infusion  250 mL Intravenous PRN Vassie Loll, MD       acetaminophen (TYLENOL) tablet 650 mg  650 mg Oral Q6H PRN Milderd Meager., MD   650 mg at 06/09/22 1527   Or   acetaminophen (TYLENOL) suppository 650 mg  650 mg Rectal Q6H PRN Milderd Meager., MD       amoxicillin-clavulanate (AUGMENTIN) 875-125 MG per tablet 1 tablet  1 tablet Oral Q12H Vassie Loll, MD   1 tablet at 06/10/22 2117   bictegravir-emtricitabine-tenofovir AF (BIKTARVY) 50-200-25 MG per tablet 1  tablet  1 tablet Oral Daily Milderd Meager., MD   1 tablet at 06/10/22 1039   doxycycline (VIBRA-TABS) tablet 100 mg  100 mg Oral Q12H Vassie Loll, MD   100 mg at 06/10/22 2118   enoxaparin (LOVENOX) injection 40 mg  40 mg Subcutaneous Q24H Davari Lopes, Danford Bad., MD       ondansetron [facility]) tablet 4 mg  4 mg Oral Q6H PRN Eriyonna Matsushita, Danford Bad., MD       Or   ondansetron [facility]) injection 4 mg  4 mg Intravenous Q6H PRN Nairi Oswald, Danford Bad., MD       oxyCODONE (Oxy IR/ROXICODONE) immediate release tablet 5 mg  5 mg Oral Q4H PRN Milderd Meager., MD   5 mg at 06/10/22 2347   potassium chloride SA (KLOR-CON M) CR tablet 40 mEq  40 mEq Oral Once Milderd Meager., MD       sodium chloride flush (NS) 0.9 % injection 3 mL  3 mL Intravenous Q12H Milderd Meager., MD   3 mL at 06/10/22 2118   sodium chloride flush (NS) 0.9 % injection 3 mL  3 mL Intravenous PRN Milderd Meager., MD   3 mL at 06/10/22  1216     Objective: Vital signs in last 24 hours: Temp:  [98.2 F (36.8 C)-98.8 F (37.1 C)] 98.4 F (36.9 C) (08/31 0609) Pulse Rate:  [75-100] 81 (08/31 0609) Resp:  [18-19] 19 (08/31 0609) BP: (123-142)/(88-93) 123/93 (08/31 0609) SpO2:  [98 %-100 %] 100 % (08/31 0609)  Intake/Output from previous day: 08/30 0701 - 08/31 0700 In: 250 [IV Piggyback:250] Out: -  Intake/Output this shift: No intake/output data recorded.  GENERAL APPEARANCE:  Well appearing, well developed, well nourished, NAD GU:  scrotal wound clean and dry; decreased induration  Lab Results:  Recent Labs    06/09/22 0854 06/10/22 0404  WBC 12.8* 8.1  HGB 12.5* 11.3*  HCT 37.3* 35.1*  PLT 203 163   BMET Recent Labs    06/10/22 0404 06/11/22 0428  NA 132* 136  K 3.1* 3.9  CL 104 104  CO2 22 28  GLUCOSE 87 81  BUN 6 6  CREATININE 0.83 0.75  CALCIUM 8.1* 8.7*   PT/INR Recent Labs    06/09/22 1305  LABPROT 14.6  INR 1.2   ABG No results for input(s): "PHART", "HCO3" in  the last 72 hours.  Invalid input(s): "PCO2", "PO2"  Studies/Results: US SCROTUM W/DOPPLER  Result Date: 06/09/2022 CLINICAL DATA:  Acute right testicular pain. EXAM: SCROTAL ULTRASOUND DOPPLER ULTRASOUND OF THE TESTICLES TECHNIQUE: Complete ultrasound examination of the testicles, epididymis, and other scrotal structures was performed. Color and spectral Doppler ultrasound were also utilized to evaluate blood flow to the testicles. COMPARISON:  CT scan of same day.  Ultrasound of Mar 05, 2021. FINDINGS: Right testicle Measurements: 3.8 x 2.3 x 1.8 cm. No mass or microlithiasis visualized. Left testicle Measurements: 3.8 x 2.4 x 1.7 cm. No mass or microlithiasis visualized. Right epididymis:  5 mm cyst is noted. Left epididymis:  4 mm cyst is noted. Hydrocele:  Small bilateral hydroceles are noted. Varicocele:  None visualized. Pulsed Doppler interrogation of both testes demonstrates normal low resistance arterial and venous waveforms bilaterally. Rounded complex abnormality measuring 3.6 x 2.5 x 2.4 cm is seen laterally in the right side of the scrotum. This most likely corresponds to abnormality seen on CT scan of same day. This is concerning for either abscess, hematoma or possibly mass. IMPRESSION: No evidence of testicular mass or torsion. 3.6 x 2.5 x 2.4 cm rounded complex abnormality is noted laterally in the right side of the scrotum which most likely corresponds to abnormality seen on CT scan of same day. This is concerning for either abscess, hematoma or possibly mass. Electronically Signed   By: Lupita Raider M.D.   On: 06/09/2022 12:50   CT ABDOMEN PELVIS W CONTRAST  Result Date: 06/09/2022 CLINICAL DATA:  Acute generalized abdominal pain, nausea, vomiting. Sepsis. EXAM: CT ABDOMEN AND PELVIS WITH CONTRAST TECHNIQUE: Multidetector CT imaging of the abdomen and pelvis was performed using the standard protocol following bolus administration of intravenous contrast. RADIATION DOSE REDUCTION:  This exam was performed according to the departmental dose-optimization program which includes automated exposure control, adjustment of the mA and/or kV according to patient size and/or use of iterative reconstruction technique. CONTRAST:  OMNIPAQUE IOHEXOL 300 MG/ML  SOLN COMPARISON:  December 14, 2021.  Mar 05, 2021. FINDINGS: Lower chest: No acute abnormality. Hepatobiliary: No focal liver abnormality is seen. No gallstones, gallbladder wall thickening, or biliary dilatation. Pancreas: Unremarkable. No pancreatic ductal dilatation or surrounding inflammatory changes. Spleen: Normal in size without focal abnormality. Adrenals/Urinary Tract: Adrenal glands appear normal. Horseshoe configuration  of the kidneys is noted. Small nonobstructive left renal calculus is noted. No hydronephrosis or renal obstruction is noted. Urinary bladder is unremarkable. Stomach/Bowel: Stomach is within normal limits. Appendix appears normal. No evidence of bowel wall thickening, distention, or inflammatory changes. Vascular/Lymphatic: No significant vascular findings are present. No enlarged abdominal or pelvic lymph nodes. Reproductive: Prostate is unremarkable. Other: No abdominal wall hernia or abnormality. No abdominopelvic ascites. However, 3.2 cm fluid collection is noted in the right scrotum. 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 No acute osseous abnormality is noted. IMPRESSION: Nonobstructive left renal calculus is noted. No hydronephrosis or renal obstruction is noted. 3.2 cm fluid collection is seen in the right scrotum. Abscess cannot be excluded. Scrotal ultrasound may be performed for further evaluation. Electronically Signed   By: Lupita Raider M.D.   On: 06/09/2022 10:42   DG Chest Portable 1 View  Result Date: 06/09/2022 CLINICAL DATA:  Cough, fever, GI symptoms, vomiting, diarrhea, periumbilical pain EXAM: PORTABLE CHEST 1 VIEW COMPARISON:  Portable exam 0856 hours compared to  03/04/2021 FINDINGS: Normal heart size, mediastinal contours, and pulmonary vascularity. Lungs clear. No pulmonary infiltrate, pleural effusion, or pneumothorax. Osseous structures unremarkable. IMPRESSION: No acute abnormalities. Electronically Signed   By: Ulyses Southward M.D.   On: 06/09/2022 09:06     Assessment & Plan: Right scrotal wall abscess - POD #2 s/p I&D  Packing changed today. Continue PO antibiotics Dressing changes daily with iodoform gauze May follow-up in office in about a week for wound check Disposition per Medicine   Di Kindle, MD 06/11/2022

## 2022-06-11 NOTE — Progress Notes (Signed)
Patient was very upset this AM stating the kitchen staff were being very rude and unprofessional towards him. Asking to sign AMA papers.  MD made aware, discharge papers, scripts and work note given.   IV removed.

## 2022-06-11 NOTE — Anesthesia Postprocedure Evaluation (Signed)
Anesthesia Post Note  Patient: Julian Sanchez  Procedure(s) Performed: INCISION AND DRAINAGE ABSCESS (Right: Scrotum)  Patient location during evaluation: Phase II Anesthesia Type: General Level of consciousness: awake Pain management: pain level controlled Vital Signs Assessment: post-procedure vital signs reviewed and stable Respiratory status: spontaneous breathing and respiratory function stable Cardiovascular status: blood pressure returned to baseline and stable Postop Assessment: no headache and no apparent nausea or vomiting Anesthetic complications: no Comments: Late entry   No notable events documented.   Last Vitals:  Vitals:   06/10/22 2129 06/11/22 0609  BP: (!) 142/88 (!) 123/93  Pulse: 100 81  Resp: 18 19  Temp: 36.8 C 36.9 C  SpO2: 98% 100%    Last Pain:  Vitals:   06/11/22 0032  TempSrc:   PainSc: Asleep                 Windell Norfolk

## 2022-06-11 NOTE — Assessment & Plan Note (Signed)
-  Discharged on PPI daily. -Lifestyle changes discussed with patient.

## 2022-06-12 LAB — AEROBIC/ANAEROBIC CULTURE W GRAM STAIN (SURGICAL/DEEP WOUND)

## 2022-06-14 LAB — CULTURE, BLOOD (ROUTINE X 2)
Culture: NO GROWTH
Culture: NO GROWTH

## 2022-06-16 ENCOUNTER — Encounter (HOSPITAL_COMMUNITY): Payer: Self-pay | Admitting: Urology

## 2023-06-26 ENCOUNTER — Encounter (HOSPITAL_COMMUNITY): Payer: Self-pay

## 2023-06-26 ENCOUNTER — Other Ambulatory Visit: Payer: Self-pay

## 2023-06-26 ENCOUNTER — Emergency Department (HOSPITAL_COMMUNITY)
Admission: EM | Admit: 2023-06-26 | Discharge: 2023-06-26 | Disposition: A | Discharge status: Home / Self Care | Payer: Self-pay | Attending: Emergency Medicine | Admitting: Emergency Medicine

## 2023-06-26 ENCOUNTER — Emergency Department (HOSPITAL_COMMUNITY): Payer: Self-pay

## 2023-06-26 DIAGNOSIS — L03012 Cellulitis of left finger: Secondary | ICD-10-CM | POA: Insufficient documentation

## 2023-06-26 MED ORDER — AMOXICILLIN-POT CLAVULANATE 875-125 MG PO TABS: 1.0000 | ORAL_TABLET | Freq: Two times a day (BID) | ORAL | 0 refills | Status: AC

## 2023-06-26 MED ORDER — AMOXICILLIN-POT CLAVULANATE 875-125 MG PO TABS
1.0000 | ORAL_TABLET | Freq: Once | ORAL | Status: AC
Start: 2023-06-26 — End: 2023-06-26
  Administered 2023-06-26: 1 via ORAL
  Filled 2023-06-26: qty 1

## 2023-06-26 MED ORDER — OXYCODONE-ACETAMINOPHEN 5-325 MG PO TABS
2.0000 | ORAL_TABLET | Freq: Once | ORAL | Status: AC
  Administered 2023-06-26: 2 via ORAL
  Filled 2023-06-26: qty 2

## 2023-06-26 MED ORDER — OXYCODONE-ACETAMINOPHEN 5-325 MG PO TABS: 1.0000 | ORAL_TABLET | Freq: Four times a day (QID) | ORAL | 0 refills | Status: DC | PRN

## 2023-06-26 NOTE — ED Triage Notes (Signed)
Pt woke up this morning with a swollen finger  LEFT hand 2nd digit Pt stated he bites his nails Noted swelling and delayed cap refill on finger tip

## 2023-06-26 NOTE — ED Provider Notes (Signed)
[place] EMERGENCY DEPARTMENT AT [facility] Provider Note   CSN: [id number] Arrival date & time: 06/26/23  1907     History Chief Complaint  Patient presents with   Finger Injury    Julian Sanchez is a 27 y.o. male.  Patient presents emergency department concerns of left index finger swelling.  Reports that he bites his nails and noted swelling up this morning.  Denies any drainage from the site.  Denies any fevers, laceration, or other injury to the finger.  Able to move finger without significant difficulty but reports the pain is severe.  HPI     Home Medications Prior to Admission medications   Medication Sig Start Date End Date Taking? Authorizing Provider  bictegravir-emtricitabine-tenofovir AF (BIKTARVY) 50-200-25 MG TABS tablet Take 1 tablet by mouth daily. 03/12/21   Cliffton Asters, MD  ibuprofen (ADVIL) 800 MG tablet Take 1 tablet (800 mg total) by mouth every 8 (eight) hours as needed for moderate pain or fever. 06/11/22   Vassie Loll, MD  oxyCODONE (ROXICODONE) 5 MG immediate release tablet Take 1 tablet (5 mg total) by mouth every 6 (six) hours as needed for severe pain. 06/11/22   Vassie Loll, MD  pantoprazole (PROTONIX) 40 MG tablet Take 1 tablet (40 mg total) by mouth daily. 06/11/22 06/11/23  Vassie Loll, MD  polyethylene glycol (MIRALAX / GLYCOLAX) 17 g packet Take 17 g by mouth daily as needed for mild constipation. 06/11/22   Vassie Loll, MD      Allergies    Patient has no known allergies.    Review of Systems   Review of Systems  Skin:  Positive for wound.  All other systems reviewed and are negative.   Physical Exam Updated Vital Signs BP (!) 131/97 (BP Location: Right Arm)   Pulse 92   Temp 99.2 F (37.3 C) (Oral)   Resp 18   Ht 6' (1.829 m)   Wt 81.6 kg   SpO2 99%   BMI 24.41 kg/m  Physical Exam Vitals and nursing note reviewed.  Constitutional:      General: He is not in acute distress.    Appearance: He is  well-developed.  HENT:     Head: Normocephalic and atraumatic.  Eyes:     Conjunctiva/sclera: Conjunctivae normal.  Cardiovascular:     Rate and Rhythm: Normal rate and regular rhythm.     Heart sounds: No murmur heard. Pulmonary:     Effort: Pulmonary effort is normal. No respiratory distress.     Breath sounds: Normal breath sounds.  Abdominal:     Palpations: Abdomen is soft.     Tenderness: There is no abdominal tenderness.  Musculoskeletal:        General: No swelling.     Cervical back: Neck supple.  Skin:    General: Skin is warm and dry.     Capillary Refill: Capillary refill takes less than 2 seconds.     Findings: Erythema present.     Comments: Redness and swelling at the left index finger base of nail. No obvious fluctuance. Significant tenderness.  Neurological:     Mental Status: He is alert.  Psychiatric:        Mood and Affect: Mood normal.     ED Results / Procedures / Treatments   Labs (all labs ordered are listed, but only abnormal results are displayed) Labs Reviewed - No data to display  EKG None  Radiology DG Hand Complete Left  Result Date: 06/26/2023 CLINICAL  DATA:  Second finger swelling EXAM: LEFT HAND - COMPLETE 3+ VIEW COMPARISON:  None Available. FINDINGS: No fracture or dislocation is seen. Mild soft tissue swelling of the second digit at the DIP joint. The joint spaces are preserved. No radiopaque foreign body is seen. IMPRESSION: Mild soft tissue swelling of the second digit at the DIP joint. No fracture or dislocation is seen. Electronically Signed   By: Charline Bills M.D.   On: 06/26/2023 19:52    Procedures Procedures   Medications Ordered in ED Medications  oxyCODONE-acetaminophen (PERCOCET/ROXICET) 5-325 MG per tablet 2 tablet (has no administration in time range)    ED Course/ Medical Decision Making/ A&P                               Medical Decision Making Amount and/or Complexity of Data Reviewed Radiology:  ordered.  Risk Prescription drug management.   This patient presents to the ED for concern of *** differential diagnosis includes ***    Additional history obtained:  Additional history obtained from *** External records from outside source obtained and reviewed including ***   Lab Tests:  I Ordered, and personally interpreted labs.  The pertinent results include:  ***   Imaging Studies ordered:  I ordered imaging studies including ***  I independently visualized and interpreted imaging which showed *** I agree with the radiologist interpretation   Medicines ordered and prescription drug management:  I ordered medication including ***  for ***  Reevaluation of the patient after these medicines showed that the patient {resolved/improved/worsened:23923::"improved"} I have reviewed the patients home medicines and have made adjustments as needed   Problem List / ED Course:  ***   Social Determinants of Health:    Final Clinical Impression(s) / ED Diagnoses Final diagnoses:  None    Rx / DC Orders ED Discharge Orders     None

## 2023-06-26 NOTE — Discharge Instructions (Signed)
You were seen in the ER today for concerns of your finger. After looking at your finger, this appears to be paronychia. Since there is not obvious area of an abscess to drain out, I have started you on antibiotics. Please take this as prescribed. For pain, take Tylenol or ibuprofen for mild pain but for severe pain, take the Percocet I have sent in to your pharmacy.

## 2023-06-29 ENCOUNTER — Emergency Department (HOSPITAL_COMMUNITY): Payer: Self-pay

## 2023-06-29 ENCOUNTER — Observation Stay (HOSPITAL_COMMUNITY)
Admission: EM | Admit: 2023-06-29 | Discharge: 2023-06-30 | Disposition: A | Discharge status: Home / Self Care | Payer: Self-pay | Attending: Internal Medicine | Admitting: Internal Medicine

## 2023-06-29 ENCOUNTER — Other Ambulatory Visit: Payer: Self-pay

## 2023-06-29 ENCOUNTER — Encounter (HOSPITAL_COMMUNITY): Payer: Self-pay

## 2023-06-29 DIAGNOSIS — Z21 Asymptomatic human immunodeficiency virus [HIV] infection status: Secondary | ICD-10-CM | POA: Insufficient documentation

## 2023-06-29 DIAGNOSIS — B20 Human immunodeficiency virus [HIV] disease: Secondary | ICD-10-CM | POA: Diagnosis present

## 2023-06-29 DIAGNOSIS — L03012 Cellulitis of left finger: Principal | ICD-10-CM | POA: Insufficient documentation

## 2023-06-29 DIAGNOSIS — L02512 Cutaneous abscess of left hand: Principal | ICD-10-CM

## 2023-06-29 DIAGNOSIS — E876 Hypokalemia: Secondary | ICD-10-CM | POA: Insufficient documentation

## 2023-06-29 DIAGNOSIS — L03114 Cellulitis of left upper limb: Secondary | ICD-10-CM

## 2023-06-29 DIAGNOSIS — L0390 Cellulitis, unspecified: Secondary | ICD-10-CM | POA: Diagnosis present

## 2023-06-29 DIAGNOSIS — F17210 Nicotine dependence, cigarettes, uncomplicated: Secondary | ICD-10-CM | POA: Insufficient documentation

## 2023-06-29 MED ORDER — LIDOCAINE HCL (PF) 2 % IJ SOLN
INTRAMUSCULAR | Status: AC
  Administered 2023-06-29: 10 mL
  Filled 2023-06-29: qty 10

## 2023-06-29 MED ORDER — FENTANYL CITRATE PF 50 MCG/ML IJ SOSY
50.0000 ug | PREFILLED_SYRINGE | Freq: Once | INTRAMUSCULAR | Status: AC
  Administered 2023-06-29: 50 ug via INTRAVENOUS
  Filled 2023-06-29: qty 1

## 2023-06-29 MED ORDER — LIDOCAINE HCL (PF) 2 % IJ SOLN: 10.0000 mL | Freq: Once | INTRAMUSCULAR | Status: AC

## 2023-06-29 NOTE — ED Triage Notes (Signed)
Pt arrives with swelling to left index finger was seen here for same on 14th. Finger is almost double the size of other fingers with swelling into left hand.

## 2023-06-29 NOTE — ED Provider Notes (Signed)
[place] EMERGENCY DEPARTMENT AT [facility]  Provider Note  CSN: [id number] Arrival date & time: 06/29/23 1904  History Chief Complaint  Patient presents with   Finger Injury    Julian Sanchez is a 27 y.o. male with history of HIV compliant with Biktarvy, but recent viral loads or CD4 counts, homelessness, prior abscesses seen in the ED 9/14 for paronychia of his L index finger did not get antibiotics filled. Returns for worsening pain and swelling. No reported fever.    Home Medications Prior to Admission medications   Medication Sig Start Date End Date Taking? Authorizing Provider  amoxicillin-clavulanate (AUGMENTIN) 875-125 MG tablet Take 1 tablet by mouth every 12 (twelve) hours for 7 days. 06/26/23 07/03/23  Smitty Knudsen, PA-C  bictegravir-emtricitabine-tenofovir AF (BIKTARVY) 50-200-25 MG TABS tablet Take 1 tablet by mouth daily. 03/12/21   Cliffton Asters, MD  ibuprofen (ADVIL) 800 MG tablet Take 1 tablet (800 mg total) by mouth every 8 (eight) hours as needed for moderate pain or fever. 06/11/22   Vassie Loll, MD  oxyCODONE (ROXICODONE) 5 MG immediate release tablet Take 1 tablet (5 mg total) by mouth every 6 (six) hours as needed for severe pain. 06/11/22   Vassie Loll, MD  oxyCODONE-acetaminophen (PERCOCET/ROXICET) 5-325 MG tablet Take 1 tablet by mouth every 6 (six) hours as needed for severe pain. 06/26/23   Smitty Knudsen, PA-C  pantoprazole (PROTONIX) 40 MG tablet Take 1 tablet (40 mg total) by mouth daily. 06/11/22 06/11/23  Vassie Loll, MD  polyethylene glycol (MIRALAX / GLYCOLAX) 17 g packet Take 17 g by mouth daily as needed for mild constipation. 06/11/22   Vassie Loll, MD     Allergies    Orange juice Erskine Emery oil] and Strawberry (diagnostic)   Review of Systems   Review of Systems Please see HPI for pertinent positives and negatives  Physical Exam BP 111/73 (BP Location: Right Arm)   Pulse 95   Temp 99.5 F (37.5 C) (Oral)   Resp 18    Ht 6' (1.829 m)   Wt 81.6 kg   SpO2 100%   BMI 24.41 kg/m   Physical Exam Vitals and nursing note reviewed.  HENT:     Head: Normocephalic.     Nose: Nose normal.  Eyes:     Extraocular Movements: Extraocular movements intact.  Pulmonary:     Effort: Pulmonary effort is normal.  Musculoskeletal:     Cervical back: Neck supple.     Comments: Marked erythema, induration and swelling of L index finger with fluctuance dorsally extending to the dorsal hand, ROM decreased due to pain  Skin:    Findings: No rash (on exposed skin).  Neurological:     Mental Status: He is alert and oriented to person, place, and time.  Psychiatric:        Mood and Affect: Mood normal.     ED Results / Procedures / Treatments   EKG None  Procedures Procedures  Medications Ordered in the ED Medications  lidocaine HCl (PF) (XYLOCAINE) 2 % injection 10 mL (has no administration in time range)    Initial Impression and Plan  Patient here with worsening finger infection, proximal spread in setting of HIV with unknown CD4 counts. Will check labs cultures, xray, and plan I&D. Likely will need IV Abx and admission.   ED Course       MDM Rules/Calculators/A&P Medical Decision Making Amount and/or Complexity of Data Reviewed Labs: ordered. Radiology: ordered.  Risk Prescription  drug management.     Final Clinical Impression(s) / ED Diagnoses Final diagnoses:  None    Rx / DC Orders ED Discharge Orders     None

## 2023-06-30 ENCOUNTER — Other Ambulatory Visit (HOSPITAL_COMMUNITY): Payer: Self-pay

## 2023-06-30 DIAGNOSIS — L03012 Cellulitis of left finger: Secondary | ICD-10-CM

## 2023-06-30 DIAGNOSIS — B20 Human immunodeficiency virus [HIV] disease: Secondary | ICD-10-CM

## 2023-06-30 DIAGNOSIS — E876 Hypokalemia: Secondary | ICD-10-CM

## 2023-06-30 DIAGNOSIS — L0390 Cellulitis, unspecified: Secondary | ICD-10-CM | POA: Diagnosis present

## 2023-06-30 LAB — CD4/CD8 (T-HELPER/T-SUPPRESSOR CELL)
CD4 absolute: 257 /uL — ABNORMAL LOW (ref 400–1790)
CD4%: 22 % — ABNORMAL LOW (ref 33–65)
CD8 T Cell Abs: 678 /uL (ref 190–1000)
CD8tox: 58.08 % — ABNORMAL HIGH (ref 12–40)
Ratio: 0.38 — ABNORMAL LOW (ref 1.0–3.0)
Total lymphocyte count: 1167 /uL (ref 1000–4000)

## 2023-06-30 LAB — CBC
HCT: 39.1 % (ref 39.0–52.0)
Hemoglobin: 12.3 g/dL — ABNORMAL LOW (ref 13.0–17.0)
MCH: 25.2 pg — ABNORMAL LOW (ref 26.0–34.0)
MCHC: 31.5 g/dL (ref 30.0–36.0)
MCV: 80.1 fL (ref 80.0–100.0)
Platelets: 239 10*3/uL (ref 150–400)
RBC: 4.88 MIL/uL (ref 4.22–5.81)
RDW: 12.8 % (ref 11.5–15.5)
WBC: 7 10*3/uL (ref 4.0–10.5)
nRBC: 0 % (ref 0.0–0.2)

## 2023-06-30 LAB — COMPREHENSIVE METABOLIC PANEL WITH GFR
ALT: 20 U/L (ref 0–44)
AST: 27 U/L (ref 15–41)
Albumin: 3.3 g/dL — ABNORMAL LOW (ref 3.5–5.0)
Alkaline Phosphatase: 59 U/L (ref 38–126)
Anion gap: 9 (ref 5–15)
BUN: 6 mg/dL (ref 6–20)
CO2: 28 mmol/L (ref 22–32)
Calcium: 8.6 mg/dL — ABNORMAL LOW (ref 8.9–10.3)
Chloride: 97 mmol/L — ABNORMAL LOW (ref 98–111)
Creatinine, Ser: 0.7 mg/dL (ref 0.61–1.24)
GFR, Estimated: >60 mL/min (ref >60)
Glucose, Bld: 111 mg/dL — ABNORMAL HIGH (ref 70–99)
Potassium: 3.2 mmol/L — ABNORMAL LOW (ref 3.5–5.1)
Sodium: 134 mmol/L — ABNORMAL LOW (ref 135–145)
Total Bilirubin: 0.5 mg/dL (ref 0.3–1.2)
Total Protein: 8.6 g/dL — ABNORMAL HIGH (ref 6.5–8.1)

## 2023-06-30 LAB — MAGNESIUM: Magnesium: 2.1 mg/dL (ref 1.7–2.4)

## 2023-06-30 MED ORDER — ONDANSETRON HCL 4 MG PO TABS: 4.0000 mg | ORAL_TABLET | Freq: Four times a day (QID) | ORAL | Status: DC | PRN

## 2023-06-30 MED ORDER — PANTOPRAZOLE SODIUM 40 MG PO TBEC
40.0000 mg | DELAYED_RELEASE_TABLET | Freq: Every day | ORAL | Status: DC
  Administered 2023-06-30: 40 mg via ORAL
  Filled 2023-06-30: qty 1

## 2023-06-30 MED ORDER — OXYCODONE HCL 5 MG PO TABS
5.0000 mg | ORAL_TABLET | ORAL | Status: DC | PRN
  Administered 2023-06-30: 5 mg via ORAL
  Filled 2023-06-30: qty 1

## 2023-06-30 MED ORDER — POLYETHYLENE GLYCOL 3350 17 G PO PACK: 17.0000 g | PACK | Freq: Every day | ORAL | Status: DC | PRN

## 2023-06-30 MED ORDER — MORPHINE SULFATE (PF) 2 MG/ML IV SOLN
2.0000 mg | INTRAVENOUS | Status: DC | PRN
  Administered 2023-06-30 (×3): 2 mg via INTRAVENOUS
  Filled 2023-06-30 (×3): qty 1

## 2023-06-30 MED ORDER — VANCOMYCIN HCL IN DEXTROSE 1-5 GM/200ML-% IV SOLN
1000.0000 mg | Freq: Once | INTRAVENOUS | Status: AC
  Administered 2023-06-30: 1000 mg via INTRAVENOUS
  Filled 2023-06-30: qty 200

## 2023-06-30 MED ORDER — ACETAMINOPHEN 650 MG RE SUPP: 650.0000 mg | Freq: Four times a day (QID) | RECTAL | Status: DC | PRN

## 2023-06-30 MED ORDER — ORITAVANCIN DIPHOSPHATE 400 MG IV SOLR
1200.0000 mg | Freq: Once | INTRAVENOUS | Status: AC
  Administered 2023-06-30: 1200 mg via INTRAVENOUS
  Filled 2023-06-30: qty 120

## 2023-06-30 MED ORDER — ACETAMINOPHEN 325 MG PO TABS: 650.0000 mg | ORAL_TABLET | Freq: Four times a day (QID) | ORAL | Status: DC | PRN

## 2023-06-30 MED ORDER — ONDANSETRON HCL 4 MG/2ML IJ SOLN
4.0000 mg | Freq: Four times a day (QID) | INTRAMUSCULAR | Status: DC | PRN
  Administered 2023-06-30: 4 mg via INTRAVENOUS
  Filled 2023-06-30: qty 2

## 2023-06-30 MED ORDER — HEPARIN SODIUM (PORCINE) 5000 UNIT/ML IJ SOLN
5000.0000 [IU] | Freq: Three times a day (TID) | INTRAMUSCULAR | Status: DC
  Filled 2023-06-30: qty 1

## 2023-06-30 MED ORDER — VANCOMYCIN HCL 1250 MG/250ML IV SOLN
1250.0000 mg | Freq: Two times a day (BID) | INTRAVENOUS | Status: DC
  Administered 2023-06-30: 1250 mg via INTRAVENOUS
  Filled 2023-06-30: qty 250

## 2023-06-30 MED ORDER — SODIUM CHLORIDE 0.9% IV SOLUTION: Freq: Once | INTRAVENOUS | Status: DC

## 2023-06-30 MED ORDER — BICTEGRAVIR-EMTRICITAB-TENOFOV 50-200-25 MG PO TABS
1.0000 | ORAL_TABLET | Freq: Every day | ORAL | Status: DC
  Filled 2023-06-30 (×3): qty 1

## 2023-06-30 MED ORDER — POTASSIUM CHLORIDE 20 MEQ PO PACK
40.0000 meq | PACK | Freq: Once | ORAL | Status: AC
  Administered 2023-06-30: 40 meq via ORAL
  Filled 2023-06-30: qty 2

## 2023-06-30 NOTE — Hospital Course (Signed)
  Brief Narrative:  27 year old with history of HIV, GERD comes to the ED with complaints of left hand second digit pain which started several days ago and worsened since then.  Initially started with swollen finger then appeared pus filled vesicle.  Initially seen in the ED, performed I&D and discharged him on Augmentin which she did not fill now comes back to the hospital.  Patient was hospitalized started on IV vancomycin.  Case was discussed with ID, dose of oritavancin to be given prior to his discharge today.     Assessment & Plan:  Principal Problem:   Cellulitis Active Problems:   HIV (human immunodeficiency virus infection) (HCC)   Hypokalemia    Cellulitis of the left finger Noncompliant with his outpatient medication, I&D performed by ED provider 2 days prior to admission.  Currently on IV antibiotics.  Due to issues with medication noncompliance, discussed with ID, will give 1 dose of oritavancin prior to discharge.   Hypokalemia Replete   HIV (human immunodeficiency virus infection) (HCC) Continue Biktarvy.  ID follow-up outpatient.       DVT prophylaxis: Subcu heparin Code Status: Full code Family Communication:   Continue hospital stay for IV antibiotics

## 2023-06-30 NOTE — Assessment & Plan Note (Signed)
-  Potassium 3.4 -Replace and recheck

## 2023-06-30 NOTE — Assessment & Plan Note (Signed)
-   Continue Biktarvy - Check CD4 count

## 2023-06-30 NOTE — Progress Notes (Signed)
Pharmacy Antibiotic Note  Julian Sanchez is a 27 y.o. male admitted on 06/29/2023 with L index finger infection.  Pharmacy has been consulted for Vancomycin dosing.  Vancomycin 1 g IV given in ED at  0100  Plan: Vancomycin 1250 mg IV q12h  Height: 6' (182.9 cm) Weight: 81.6 kg (180 lb) IBW/kg (Calculated) : 77.6  Temp (24hrs), Avg:99.2 F (37.3 C), Min:98.9 F (37.2 C), Max:99.5 F (37.5 C)  Recent Labs  Lab 06/29/23 2320  WBC 7.6  CREATININE 0.79    Estimated Creatinine Clearance: 149.5 mL/min (by C-G formula based on SCr of 0.79 mg/dL).    Allergies  Allergen Reactions   Orange Juice [Orange Oil]    Strawberry (Diagnostic)      Eddie Candle 06/30/2023 3:19 AM

## 2023-06-30 NOTE — Discharge Summary (Signed)
Physician Discharge Summary  Julian Sanchez:[id number] DOB: [birth year] DOA: 06/29/2023  PCP: Patient, No Pcp Per  Admit date: 06/29/2023 Discharge date: 06/30/2023  Admitted From: Home Disposition: Home  Recommendations for Outpatient Follow-up:  Follow up with PCP in 1-2 weeks Please obtain BMP/CBC in one week your next doctors visit.  Follow-up outpatient with infectious disease.   Discharge Condition: Stable CODE STATUS: Full code Diet recommendation: Regular  Brief/Interim Summary:  Brief Narrative:  27 year old with history of HIV, GERD comes to the ED with complaints of left hand second digit pain which started several days ago and worsened since then.  Initially started with swollen finger then appeared pus filled vesicle.  Initially seen in the ED, performed I&D and discharged him on Augmentin which she did not fill now comes back to the hospital.  Patient was hospitalized started on IV vancomycin.  Case was discussed with ID, dose of oritavancin to be given prior to his discharge today.     Assessment & Plan:  Principal Problem:   Cellulitis Active Problems:   HIV (human immunodeficiency virus infection) (HCC)   Hypokalemia    Cellulitis of the left finger Noncompliant with his outpatient medication, I&D performed by ED provider 2 days prior to admission.  Currently on IV antibiotics.  Due to issues with medication noncompliance, discussed with ID, will give 1 dose of oritavancin prior to discharge.   Hypokalemia Replete   HIV (human immunodeficiency virus infection) (HCC) Continue Biktarvy.  ID follow-up outpatient.     Discharged today after his IV antibiotic dose Discharge Diagnoses:  Principal Problem:   Cellulitis Active Problems:   HIV (human immunodeficiency virus infection) (HCC)   Hypokalemia      Consultations: Curbside infectious disease  Subjective: Doing well no complaints.  Discharge Exam: Vitals:   06/29/23 2218 06/30/23 0605  BP:  111/73 108/61  Pulse: 95 71  Resp: 18 17  Temp: 99.5 F (37.5 C) 98.9 F (37.2 C)  SpO2: 100% 99%   Vitals:   06/29/23 1934 06/29/23 1935 06/29/23 2218 06/30/23 0605  BP: 121/78  111/73 108/61  Pulse: 97  95 71  Resp: 16  18 17   Temp: 98.9 F (37.2 C)  99.5 F (37.5 C) 98.9 F (37.2 C)  TempSrc: Oral  Oral Oral  SpO2: 100%  100% 99%  Weight:  81.6 kg    Height:  6' (1.829 m)      General: Pt is alert, awake, not in acute distress Cardiovascular: RRR, S1/S2 +, no rubs, no gallops Respiratory: CTA bilaterally, no wheezing, no rhonchi Abdominal: Soft, NT, ND, bowel sounds + Extremities: no edema, no cyanosis  Discharge Instructions   Allergies as of 06/30/2023       Reactions   Orange Juice [orange Oil]    Strawberry (diagnostic)         Medication List     TAKE these medications    amoxicillin-clavulanate 875-125 MG tablet Commonly known as: AUGMENTIN Take 1 tablet by mouth every 12 (twelve) hours for 7 days.   Biktarvy 50-200-25 MG Tabs tablet Generic drug: bictegravir-emtricitabine-tenofovir AF Take 1 tablet by mouth daily.   ibuprofen 800 MG tablet Commonly known as: ADVIL Take 1 tablet (800 mg total) by mouth every 8 (eight) hours as needed for moderate pain or fever.   oxyCODONE 5 MG immediate release tablet Commonly known as: Roxicodone Take 1 tablet (5 mg total) by mouth every 6 (six) hours as needed for severe pain.   oxyCODONE-acetaminophen 5-325 MG  tablet Commonly known as: PERCOCET/ROXICET Take 1 tablet by mouth every 6 (six) hours as needed for severe pain.   pantoprazole 40 MG tablet Commonly known as: Protonix Take 1 tablet (40 mg total) by mouth daily.   polyethylene glycol 17 g packet Commonly known as: MIRALAX / GLYCOLAX Take 17 g by mouth daily as needed for mild constipation.        Allergies  Allergen Reactions   Orange Juice [Orange Oil]    Strawberry (Diagnostic)     You were cared for by a hospitalist during your  hospital stay. If you have any questions about your discharge medications or the care you received while you were in the hospital after you are discharged, you can call the unit and asked to speak with the hospitalist on call if the hospitalist that took care of you is not available. Once you are discharged, your primary care physician will handle any further medical issues. Please note that no refills for any discharge medications will be authorized once you are discharged, as it is imperative that you return to your primary care physician (or establish a relationship with a primary care physician if you do not have one) for your aftercare needs so that they can reassess your need for medications and monitor your lab values.  You were cared for by a hospitalist during your hospital stay. If you have any questions about your discharge medications or the care you received while you were in the hospital after you are discharged, you can call the unit and asked to speak with the hospitalist on call if the hospitalist that took care of you is not available. Once you are discharged, your primary care physician will handle any further medical issues. Please note that NO REFILLS for any discharge medications will be authorized once you are discharged, as it is imperative that you return to your primary care physician (or establish a relationship with a primary care physician if you do not have one) for your aftercare needs so that they can reassess your need for medications and monitor your lab values.  Please request your Prim.MD to go over all Hospital Tests and Procedure/Radiological results at the follow up, please get all Hospital records sent to your Prim MD by signing hospital release before you go home.  Get CBC, CMP, 2 view Chest X ray checked  by Primary MD during your next visit or SNF MD in 5-7 days ( we routinely change or add medications that can affect your baseline labs and fluid status, therefore we  recommend that you get the mentioned basic workup next visit with your PCP, your PCP may decide not to get them or add new tests based on their clinical decision)  On your next visit with your primary care physician please Get Medicines reviewed and adjusted.  If you experience worsening of your admission symptoms, develop shortness of breath, life threatening emergency, suicidal or homicidal thoughts you must seek medical attention immediately by calling 911 or calling your MD immediately  if symptoms less severe.  You Must read complete instructions/literature along with all the possible adverse reactions/side effects for all the Medicines you take and that have been prescribed to you. Take any new Medicines after you have completely understood and accpet all the possible adverse reactions/side effects.   Do not drive, operate heavy machinery, perform activities at heights, swimming or participation in water activities or provide baby sitting services if your were admitted for syncope or siezures until  you have seen by Primary MD or a Neurologist and advised to do so again.  Do not drive when taking Pain medications.   Procedures/Studies: DG Finger Index Left  Result Date: 06/29/2023 CLINICAL DATA:  Infection, concern for osteomyelitis. EXAM: LEFT INDEX FINGER 2+V COMPARISON:  Hand radiograph 06/26/2023 FINDINGS: Progressive soft tissue thickening throughout the index finger. No soft tissue gas or radiopaque foreign body. No erosive change or periostitis. No fracture. The alignment and joint spaces are preserved. IMPRESSION: Progressive soft tissue thickening throughout the index finger, likely cellulitis in the setting of infection. No radiographic findings of osteomyelitis. Electronically Signed   By: Narda Rutherford M.D.   On: 06/29/2023 23:49   DG Hand Complete Left  Result Date: 06/26/2023 CLINICAL DATA:  Second finger swelling EXAM: LEFT HAND - COMPLETE 3+ VIEW COMPARISON:  None  Available. FINDINGS: No fracture or dislocation is seen. Mild soft tissue swelling of the second digit at the DIP joint. The joint spaces are preserved. No radiopaque foreign body is seen. IMPRESSION: Mild soft tissue swelling of the second digit at the DIP joint. No fracture or dislocation is seen. Electronically Signed   By: Charline Bills M.D.   On: 06/26/2023 19:52     The results of significant diagnostics from this hospitalization (including imaging, microbiology, ancillary and laboratory) are listed below for reference.     Microbiology: Recent Results (from the past 240 hour(s))  Culture, blood (routine x 2)     Status: None (Preliminary result)   Collection Time: 06/29/23 11:56 PM   Specimen: Right Antecubital; Blood  Result Value Ref Range Status   Specimen Description   Final    RIGHT ANTECUBITAL BOTTLES DRAWN AEROBIC AND ANAEROBIC   Special Requests Blood Culture adequate volume  Final   Culture   Final    NO GROWTH < 12 HOURS Performed at [facility], [address], [place], [place] [address]    Report Status PENDING  Incomplete  Culture, blood (routine x 2)     Status: None (Preliminary result)   Collection Time: 06/29/23 11:58 PM   Specimen: Left Antecubital; Blood  Result Value Ref Range Status   Specimen Description   Final    LEFT ANTECUBITAL BOTTLES DRAWN AEROBIC AND ANAEROBIC   Special Requests   Final    Blood Culture results may not be optimal due to an excessive volume of blood received in culture bottles   Culture   Final    NO GROWTH < 12 HOURS Performed at [facility], [address] [place], [place] [address]    Report Status PENDING  Incomplete     Labs: BNP (last 3 results) No results for input(s): "BNP" in the last 8760 hours. Basic Metabolic Panel: Recent Labs  Lab 06/29/23 2320 06/30/23 0418  NA 135 134*  K 3.4* 3.2*  CL 99 97*  CO2 28 28  GLUCOSE 97 111*  BUN 7 6  CREATININE 0.79 0.70  CALCIUM 8.6* 8.6*  MG  --  2.1   Liver  Function Tests: Recent Labs  Lab 06/30/23 0418  AST 27  ALT 20  ALKPHOS 59  BILITOT 0.5  PROT 8.6*  ALBUMIN 3.3*   No results for input(s): "LIPASE", "AMYLASE" in the last 168 hours. No results for input(s): "AMMONIA" in the last 168 hours. CBC: Recent Labs  Lab 06/29/23 2320 06/30/23 0418  WBC 7.6 7.0  NEUTROABS 5.1  --   HGB 12.8* 12.3*  HCT 41.0 39.1  MCV 80.2 80.1  PLT 252 239   Cardiac Enzymes: No results for input(s): "CKTOTAL", "CKMB", "CKMBINDEX", "TROPONINI" in the last 168 hours. BNP: Invalid input(s): "POCBNP" CBG: No results for input(s): "GLUCAP" in the last 168 hours. D-Dimer No results for input(s): "DDIMER" in the last 72 hours. Hgb A1c No results for input(s): "HGBA1C" in the last 72 hours. Lipid Profile No results for input(s): "CHOL", "HDL", "LDLCALC", "TRIG", "CHOLHDL", "LDLDIRECT" in the last 72 hours. Thyroid function studies No results for input(s): "TSH", "T4TOTAL", "T3FREE", "THYROIDAB" in the last 72 hours.  Invalid input(s): "FREET3" Anemia work up No results for input(s): "VITAMINB12", "FOLATE", "FERRITIN", "TIBC", "IRON", "RETICCTPCT" in the last 72 hours. Urinalysis    Component Value Date/Time   COLORURINE YELLOW 06/09/2022 2155   APPEARANCEUR CLEAR 06/09/2022 2155   LABSPEC 1.018 06/09/2022 2155   PHURINE 6.0 06/09/2022 2155   GLUCOSEU NEGATIVE 06/09/2022 2155   HGBUR LARGE (A) 06/09/2022 2155   BILIRUBINUR NEGATIVE 06/09/2022 2155   KETONESUR 20 (A) 06/09/2022 2155   PROTEINUR 30 (A) 06/09/2022 2155   UROBILINOGEN 0.2 06/08/2014 2133   NITRITE NEGATIVE 06/09/2022 2155   LEUKOCYTESUR NEGATIVE 06/09/2022 2155   Sepsis Labs Recent Labs  Lab 06/29/23 2320 06/30/23 0418  WBC 7.6 7.0   Microbiology Recent Results (from the past 240 hour(s))  Culture, blood (routine x 2)     Status: None (Preliminary result)   Collection Time: 06/29/23 11:56 PM   Specimen: Right Antecubital; Blood  Result Value Ref Range Status    Specimen Description   Final    RIGHT ANTECUBITAL BOTTLES DRAWN AEROBIC AND ANAEROBIC   Special Requests Blood Culture adequate volume  Final   Culture   Final    NO GROWTH < 12 HOURS Performed at [facility], [address], [place], [place] [address]    Report Status PENDING  Incomplete  Culture, blood (routine x 2)     Status: None (Preliminary result)   Collection Time: 06/29/23 11:58 PM   Specimen: Left Antecubital; Blood  Result Value Ref Range Status   Specimen Description   Final    LEFT ANTECUBITAL BOTTLES DRAWN AEROBIC AND ANAEROBIC   Special Requests   Final    Blood Culture results may not be optimal due to an excessive volume of blood received in culture bottles   Culture   Final    NO GROWTH < 12 HOURS Performed at [facility], [address] [place], [place] [address]    Report Status PENDING  Incomplete     Time coordinating discharge:  I have spent 35 minutes face to face with the patient and on the ward discussing the patients care, assessment, plan and disposition with other care givers. >50% of the time was devoted counseling the patient about the risks and benefits of treatment/Discharge disposition and coordinating care.   SIGNED:   Miguel Rota, MD  [facility] 06/30/2023, 12:21 PM   If 7PM-7AM, please contact night-coverage

## 2023-06-30 NOTE — H&P (Signed)
History and Physical    Patient: Julian Sanchez FAO:[id number] DOB: [birth year] DOA: 06/29/2023 DOS: the patient was seen and examined on 06/30/2023 PCP: Patient, No Pcp Per  Patient coming from:  Unknown  Chief Complaint:  Chief Complaint  Patient presents with   Finger Injury   HPI: Julian Sanchez is a 27 y.o. male with medical history significant of HIV, GERD, presents to the ED with a chief complaint of left hand second digit pain.  Patient reports that the pain started several days ago.  Is been worse since it started.  It appeared that his finger was swollen at first, then it appeared to be pus filled vesicle.  He had no drainage at home.  Patient reports no fever.  He reports the finger was quite painful feeling like a pressure and a stabbing pain.  It was better after they I&D to in the ED.  He was seen in the ED a couple of days ago and prescribed Augmentin.  He did not fill the Augmentin due to financial barriers.  When we discussed that the Augmentin only cost $8, he got very angry with me and said it cost $50 and he has no reason to live to me and then he refused to participate in the exam any further.  I tried to explain to him that this was important to discuss because when he gets discharged from the hospital he is going to need to take his antibiotics especially given his immunocompromise state.  He was very angry, would not speak to me, declined exam.  No further history could be obtained at this time. Review of Systems: Unable to review all systems due to lack of cooperation from patient. Past Medical History:  Diagnosis Date   ADHD (attention deficit hyperactivity disorder)    HIV disease (HCC)    Hx of seasonal allergies    No pertinent past medical history    Recurrent tonsillitis    Wisdom teeth extracted    Past Surgical History:  Procedure Laterality Date   BIOPSY  03/08/2021   Procedure: BIOPSY;  Surgeon: Bernette Redbird, MD;  Location: [facility];  Service:  Endoscopy;;   FLEXIBLE SIGMOIDOSCOPY N/A 03/08/2021   Procedure: Arnell Sieving;  Surgeon: Bernette Redbird, MD;  Location: [facility];  Service: Endoscopy;  Laterality: N/A;   INCISION AND DRAINAGE ABSCESS Right 06/09/2022   Procedure: INCISION AND DRAINAGE ABSCESS;  Surgeon: Milderd Meager., MD;  Location: [facility];  Service: Urology;  Laterality: Right;   IRRIGATION AND DEBRIDEMENT ABSCESS  09/28/2012   Procedure: IRRIGATION AND DEBRIDEMENT ABSCESS;  Surgeon: Flo Shanks, MD;  Location: [facility] OR;  Service: ENT;  Laterality: Right;  Right Peritonsillar Abscess   IRRIGATION AND DEBRIDEMENT ABSCESS N/A 03/05/2021   Procedure: IRRIGATION AND DEBRIDEMENT ABSCESS;  Surgeon: Crista Elliot, MD;  Location: [facility];  Service: Urology;  Laterality: N/A;   MOUTH SURGERY     SCROTAL EXPLORATION N/A 03/05/2021   Procedure: SCROTUM EXPLORATION;  Surgeon: Crista Elliot, MD;  Location: [facility];  Service: Urology;  Laterality: N/A;   TONSILLECTOMY     TONSILLECTOMY N/A 07/23/2017   Procedure: TONSILLECTOMY;  Surgeon: Christia Reading, MD;  Location: [facility] OR;  Service: ENT;  Laterality: N/A;   WISDOM TOOTH EXTRACTION     Social History:  reports that he has been smoking cigarettes. He has never used smokeless tobacco. He reports current drug use. Frequency: 7.00 times per week. Drug: Marijuana. He reports that he does not  drink alcohol.  Allergies  Allergen Reactions   Orange Juice [Orange Oil]    Strawberry (Diagnostic)     Family History  Problem Relation Age of Onset   Diabetes Mother    Asthma Father     Prior to Admission medications   Medication Sig Start Date End Date Taking? Authorizing Provider  amoxicillin-clavulanate (AUGMENTIN) 875-125 MG tablet Take 1 tablet by mouth every 12 (twelve) hours for 7 days. 06/26/23 07/03/23  Smitty Knudsen, PA-C  bictegravir-emtricitabine-tenofovir AF (BIKTARVY) 50-200-25 MG TABS tablet Take 1 tablet by mouth daily. 03/12/21   Cliffton Asters, MD   ibuprofen (ADVIL) 800 MG tablet Take 1 tablet (800 mg total) by mouth every 8 (eight) hours as needed for moderate pain or fever. 06/11/22   Vassie Loll, MD  oxyCODONE (ROXICODONE) 5 MG immediate release tablet Take 1 tablet (5 mg total) by mouth every 6 (six) hours as needed for severe pain. 06/11/22   Vassie Loll, MD  oxyCODONE-acetaminophen (PERCOCET/ROXICET) 5-325 MG tablet Take 1 tablet by mouth every 6 (six) hours as needed for severe pain. 06/26/23   Smitty Knudsen, PA-C  pantoprazole (PROTONIX) 40 MG tablet Take 1 tablet (40 mg total) by mouth daily. 06/11/22 06/11/23  Vassie Loll, MD  polyethylene glycol (MIRALAX / GLYCOLAX) 17 g packet Take 17 g by mouth daily as needed for mild constipation. 06/11/22   Vassie Loll, MD    Physical Exam: Vitals:   06/29/23 1934 06/29/23 1935 06/29/23 2218 06/30/23 0605  BP: 121/78  111/73 108/61  Pulse: 97  95 71  Resp: 16  18 17   Temp: 98.9 F (37.2 C)  99.5 F (37.5 C) 98.9 F (37.2 C)  TempSrc: Oral  Oral Oral  SpO2: 100%  100% 99%  Weight:  81.6 kg    Height:  6' (1.829 m)     1.  General: Patient lying supine in bed,  no acute distress   2. Psychiatric: Alert and oriented x 3, mood is irritable and patient is unpleasant   3. Neurologic: Speech and language are normal, face is symmetric, moves all 4 extremities voluntarily, at baseline without acute deficits on limited exam   4. HEENMT:  Head is atraumatic, normocephalic, neck is supple, trachea is midline, mucous membranes are moist   5. Respiratory : Declined lung exam   6. Cardiovascular : Decline in heart exam   7. Gastrointestinal:  Declined abdominal exam   8. Skin:  Skin is warm, dry and intact without rashes, acute lesions, or ulcers on limited exam   9.Musculoskeletal:  No acute deformities or trauma, no asymmetry in tone,  Data Reviewed: In the ED Afebrile, heart rate 95-97, respiratory rate 16-18, blood pressure 111/73-121/78, satting at 100% No  leukocytosis with a white blood cell count of 7.6, hemoglobin 12.8, platelets 252 Chemistry reveals a hypokalemia at 3.4 Blood cultures pending X-ray shows cellulitis without evidence of osteomyelitis Patient was started on vancomycin Fentanyl was given for pain control   Assessment and Plan: * Cellulitis - Left hand pointer finger - I&D in the ED - Had been prescribed Augmentin but did not fill it due to financial barriers - Continue vancomycin - Continue pain control - Continue to monitor  Hypokalemia - Potassium 3.4 - Replace and recheck  HIV (human immunodeficiency virus infection) (HCC) - Continue Biktarvy - Check CD4 count      Advance Care Planning:   Code Status: Full Code  Consults: None at this time  Family Communication: No family at  bedside  Severity of Illness: The appropriate patient status for this patient is OBSERVATION. Observation status is judged to be reasonable and necessary in order to provide the required intensity of service to ensure the patient's safety. The patient's presenting symptoms, physical exam findings, and initial radiographic and laboratory data in the context of their medical condition is felt to place them at decreased risk for further clinical deterioration. Furthermore, it is anticipated that the patient will be medically stable for discharge from the hospital within 2 midnights of admission.   Author: Lilyan Gilford, DO 06/30/2023 6:15 AM  For on call review [email or web address].

## 2023-06-30 NOTE — Assessment & Plan Note (Signed)
-   Left hand pointer finger - I&D in the ED - Had been prescribed Augmentin but did not fill it due to financial barriers - Continue vancomycin - Continue pain control - Continue to monitor

## 2023-06-30 NOTE — ED Notes (Signed)
ED TO INPATIENT HANDOFF REPORT  ED Nurse Name and Phone #:   S Name/Age/Gender Julian Sanchez 27 y.o. male Room/Bed: APA17/APA17  Code Status   Code Status: Prior  Home/SNF/Other Home Patient oriented to: self, place, time, and situation Is this baseline? Yes   Triage Complete: Triage complete  Chief Complaint Cellulitis [L03.90]  Triage Note Pt arrives with swelling to left index finger was seen here for same on 14th. Finger is almost double the size of other fingers with swelling into left hand.    Allergies Allergies  Allergen Reactions   Orange Juice [Orange Oil]    Strawberry (Diagnostic)     Level of Care/Admitting Diagnosis ED Disposition     ED Disposition  Admit   Condition  --   Comment  Hospital Area: [facility] [[id number]]  Level of Care: Med-Surg [16]  Covid Evaluation: Asymptomatic - no recent exposure (last 10 days) testing not required  Diagnosis: Cellulitis [161096]  Admitting Physician: Lilyan Gilford [[id number]]  Attending Physician: Lilyan Gilford [[id number]]          B Medical/Surgery History Past Medical History:  Diagnosis Date   ADHD (attention deficit hyperactivity disorder)    HIV disease (HCC)    Hx of seasonal allergies    No pertinent past medical history    Recurrent tonsillitis    Wisdom teeth extracted    Past Surgical History:  Procedure Laterality Date   BIOPSY  03/08/2021   Procedure: BIOPSY;  Surgeon: Bernette Redbird, MD;  Location: [facility];  Service: Endoscopy;;   FLEXIBLE SIGMOIDOSCOPY N/A 03/08/2021   Procedure: Arnell Sieving;  Surgeon: Bernette Redbird, MD;  Location: [facility];  Service: Endoscopy;  Laterality: N/A;   INCISION AND DRAINAGE ABSCESS Right 06/09/2022   Procedure: INCISION AND DRAINAGE ABSCESS;  Surgeon: Milderd Meager., MD;  Location: [facility];  Service: Urology;  Laterality: Right;   IRRIGATION AND DEBRIDEMENT ABSCESS  09/28/2012   Procedure: IRRIGATION AND  DEBRIDEMENT ABSCESS;  Surgeon: Flo Shanks, MD;  Location: [facility] OR;  Service: ENT;  Laterality: Right;  Right Peritonsillar Abscess   IRRIGATION AND DEBRIDEMENT ABSCESS N/A 03/05/2021   Procedure: IRRIGATION AND DEBRIDEMENT ABSCESS;  Surgeon: Crista Elliot, MD;  Location: [facility];  Service: Urology;  Laterality: N/A;   MOUTH SURGERY     SCROTAL EXPLORATION N/A 03/05/2021   Procedure: SCROTUM EXPLORATION;  Surgeon: Crista Elliot, MD;  Location: [facility];  Service: Urology;  Laterality: N/A;   TONSILLECTOMY     TONSILLECTOMY N/A 07/23/2017   Procedure: TONSILLECTOMY;  Surgeon: Christia Reading, MD;  Location: [facility] OR;  Service: ENT;  Laterality: N/A;   WISDOM TOOTH EXTRACTION       A IV Location/Drains/Wounds Patient Lines/Drains/Airways Status     Active Line/Drains/Airways     Name Placement date Placement time Site Days   Peripheral IV 06/29/23 20 G 1" Right Antecubital 06/29/23  2338  Antecubital  1            Intake/Output Last 24 hours No intake or output data in the 24 hours ending 06/30/23 0142  Labs/Imaging Results for orders placed or performed during the hospital encounter of 06/29/23 (from the past 48 hour(s))  Basic metabolic panel     Status: Abnormal   Collection Time: 06/29/23 11:20 PM  Result Value Ref Range   Sodium 135 135 - 145 mmol/L   Potassium 3.4 (L) 3.5 - 5.1 mmol/L   Chloride 99 98 - 111 mmol/L  CO2 28 22 - 32 mmol/L   Glucose, Bld 97 70 - 99 mg/dL    Comment: Glucose reference range applies only to samples taken after fasting for at least 8 hours.   BUN 7 6 - 20 mg/dL   Creatinine, Ser 8.11 0.61 - 1.24 mg/dL   Calcium 8.6 (L) 8.9 - 10.3 mg/dL   GFR, Estimated >91 >47 mL/min    Comment: (NOTE) Calculated using the CKD-EPI Creatinine Equation (2021)    Anion gap 8 5 - 15    Comment: Performed at [facility], [address] [place], [place] [address]  CBC with Differential     Status: Abnormal   Collection Time: 06/29/23 11:20 PM  Result  Value Ref Range   WBC 7.6 4.0 - 10.5 K/uL   RBC 5.11 4.22 - 5.81 MIL/uL   Hemoglobin 12.8 (L) 13.0 - 17.0 g/dL   HCT 21.3 08.6 - 57.8 %   MCV 80.2 80.0 - 100.0 fL   MCH 25.0 (L) 26.0 - 34.0 pg   MCHC 31.2 30.0 - 36.0 g/dL   RDW 46.9 62.9 - 52.8 %   Platelets 252 150 - 400 K/uL   nRBC 0.0 0.0 - 0.2 %   Neutrophils Relative % 67 %   Neutro Abs 5.1 1.7 - 7.7 K/uL   Lymphocytes Relative 21 %   Lymphs Abs 1.6 0.7 - 4.0 K/uL   Monocytes Relative 10 %   Monocytes Absolute 0.8 0.1 - 1.0 K/uL   Eosinophils Relative 2 %   Eosinophils Absolute 0.1 0.0 - 0.5 K/uL   Basophils Relative 0 %   Basophils Absolute 0.0 0.0 - 0.1 K/uL   Immature Granulocytes 0 %   Abs Immature Granulocytes 0.02 0.00 - 0.07 K/uL    Comment: Performed at [facility], [address], [place], [place] [address]  Culture, blood (routine x 2)     Status: None (Preliminary result)   Collection Time: 06/29/23 11:56 PM   Specimen: Right Antecubital; Blood  Result Value Ref Range   Specimen Description      RIGHT ANTECUBITAL BOTTLES DRAWN AEROBIC AND ANAEROBIC   Special Requests      Blood Culture adequate volume Performed at [facility], [address], [place], [place] [address]    Culture PENDING    Report Status PENDING   Culture, blood (routine x 2)     Status: None (Preliminary result)   Collection Time: 06/29/23 11:58 PM   Specimen: Left Antecubital; Blood  Result Value Ref Range   Specimen Description      LEFT ANTECUBITAL BOTTLES DRAWN AEROBIC AND ANAEROBIC   Special Requests      Blood Culture results may not be optimal due to an excessive volume of blood received in culture bottles Performed at [facility], [address] [place], [place] [address]    Culture PENDING    Report Status PENDING    DG Finger Index Left  Result Date: 06/29/2023 CLINICAL DATA:  Infection, concern for osteomyelitis. EXAM: LEFT INDEX FINGER 2+V COMPARISON:  Hand radiograph 06/26/2023 FINDINGS: Progressive soft tissue  thickening throughout the index finger. No soft tissue gas or radiopaque foreign body. No erosive change or periostitis. No fracture. The alignment and joint spaces are preserved. IMPRESSION: Progressive soft tissue thickening throughout the index finger, likely cellulitis in the setting of infection. No radiographic findings of osteomyelitis. Electronically Signed   By: Narda Rutherford M.D.   On: 06/29/2023 23:49    Pending Labs Unresulted Labs (From admission,  onward)    None       Vitals/Pain Today's Vitals   06/29/23 1934 06/29/23 1935 06/29/23 2218  BP: 121/78  111/73  Pulse: 97  95  Resp: 16  18  Temp: 98.9 F (37.2 C)  99.5 F (37.5 C)  TempSrc: Oral  Oral  SpO2: 100%  100%  Weight:  180 lb (81.6 kg)   Height:  6' (1.829 m)   PainSc: 10-Worst pain ever      Isolation Precautions No active isolations  Medications Medications  vancomycin (VANCOCIN) IVPB 1000 mg/200 mL premix (1,000 mg Intravenous New Bag/Given 06/30/23 0101)  lidocaine HCl (PF) (XYLOCAINE) 2 % injection 10 mL (10 mLs Infiltration Given 06/29/23 2347)  fentaNYL (SUBLIMAZE) injection 50 mcg (50 mcg Intravenous Given 06/29/23 2352)    Mobility walks     Focused Assessments    R Recommendations: See Admitting Provider Note  Report given to:   Additional Notes:

## 2023-07-01 ENCOUNTER — Telehealth: Payer: Self-pay | Admitting: Pharmacy Technician

## 2023-07-01 ENCOUNTER — Other Ambulatory Visit (HOSPITAL_COMMUNITY): Payer: Self-pay

## 2023-07-01 NOTE — Telephone Encounter (Signed)
[facility] Patient Advocate Encounter  Insurance verification completed.    The patient is uninsured and will need patient assistance for medication.  We can complete the application and will need to meet with the patient for signatures and income documentation.

## 2023-07-05 LAB — CULTURE, BLOOD (ROUTINE X 2)
Culture: NO GROWTH
Culture: NO GROWTH
Special Requests: ADEQUATE

## 2023-07-06 ENCOUNTER — Ambulatory Visit: Payer: Self-pay | Admitting: Infectious Diseases

## 2023-07-06 ENCOUNTER — Ambulatory Visit: Payer: Self-pay

## 2023-08-09 ENCOUNTER — Other Ambulatory Visit (HOSPITAL_COMMUNITY): Payer: Self-pay

## 2023-08-09 ENCOUNTER — Telehealth: Payer: Self-pay

## 2023-08-09 NOTE — Telephone Encounter (Signed)
[facility] Patient Advocate Encounter  Insurance verification completed.    The patient is uninsured and will need patient assistance for medication.  We can complete the application and will need to meet with the patient for signatures and income documentation.

## 2023-08-16 NOTE — Progress Notes (Deleted)
Brief Narrative   Patient ID: Julian Sanchez, male    DOB: 12-Jan-1996, 27 y.o.   MRN: [id number]  Mr. Julian Sanchez is a 27 y/o AA male diagnosed with HIV-1 disease in October 2016 with risk factor of MSM. Initial viral load 43,695 with CD4 count 440. Entered care at [facility] Stage 2. 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 Quantiferon Gold negative. No history of opportunistic infection. ART experienced with dolutegravir/descovy and Biktarvy.   Subjective:    No chief complaint on file.   HPI:  Julian Sanchez is a 27 y.o. male with HIV disease last seen by Dr. Orvan Falconer on 03/12/21 with adequately controlled virus with improved adherence with Biktarvy following hospitalization for MRSA and corynebacterium abscesses. Viral load was 198 and CD4 count 292. Most recent lab work completed on 06/30/23 with CD4 count 257. No viral load available.     Denies fevers, chills, night sweats, headaches, changes in vision, neck pain/stiffness, nausea, diarrhea, vomiting, lesions or rashes.  Lab Results  Component Value Date   CD4TCELL 18 (L) 03/12/2021   CD4TABS 292 (L) 03/12/2021   Lab Results  Component Value Date   HIV1RNAQUANT 198 (H) 03/12/2021     Allergies  Allergen Reactions   Orange Juice [Orange Oil]    Strawberry (Diagnostic)       Outpatient Medications Prior to Visit  Medication Sig Dispense Refill   bictegravir-emtricitabine-tenofovir AF (BIKTARVY) 50-200-25 MG TABS tablet Take 1 tablet by mouth daily. 30 tablet 11   No facility-administered medications prior to visit.     Past Medical History:  Diagnosis Date   ADHD (attention deficit hyperactivity disorder)    HIV disease (HCC)    Hx of seasonal allergies    No pertinent past medical history    Recurrent tonsillitis    Wisdom teeth extracted      Past Surgical History:  Procedure Laterality Date   BIOPSY  03/08/2021   Procedure: BIOPSY;  Surgeon: Bernette Redbird, MD;   [facility]: [facility];  Service: Endoscopy;;   FLEXIBLE SIGMOIDOSCOPY N/A 03/08/2021   Procedure: Arnell Sieving;  Surgeon: Bernette Redbird, MD;  Location[facility];  Service: Endoscopy;  Laterality: N/A;   INCISION AND DRAINAGE ABSCESS Right 06/09/2022   Procedure: INCISION AND DRAINAGE ABSCESS;  Surgeon: Milderd Meager., MD;  Location: [facility];  Service: Urology;  Laterality: Right;   IRRIGATION AND DEBRIDEMENT ABSCESS  09/28/2012   Procedure: IRRIGATION AND DEBRIDEMENT ABSCESS;  Surgeon: Flo Shanks, MD;  Location[facility] OR;  Service: ENT;  Laterality: Right;  Right Peritonsillar Abscess   IRRIGATION AND DEBRIDEMENT ABSCESS N/A 03/05/2021   Procedure: IRRIGATION AND DEBRIDEMENT ABSCESS;  Surgeon: Crista Elliot, MD;  Location: [facility];  Service: Urology;  Laterality: N/A;   MOUTH SURGERY     SCROTAL EXPLORATION N/A 03/05/2021   Procedure: SCROTUM EXPLORATION;  Surgeon: Crista Elliot, MD;  Location: [facility];  Service: Urology;  Laterality: N/A;   TONSILLECTOMY     TONSILLECTOMY N/A 07/23/2017   Procedure: TONSILLECTOMY;  Surgeon: Christia Reading, MD;  Location: [facility] OR;  Service: ENT;  Laterality: N/A;   WISDOM TOOTH EXTRACTION        Review of Systems    Objective:    There were no vitals taken for this visit. Nursing note and vital signs reviewed.  Physical Exam      03/12/2021   10:39 AM 03/22/2019   10:22 AM 02/02/2017    2:59 PM 09/10/2015  3:44 PM 08/19/2015    2:19 PM  Depression screen PHQ 2/9  Decreased Interest 0 2 0 0 1  Down, Depressed, Hopeless 0 2 1 0 1  PHQ - 2 Score 0 4 1 0 2  Suicidal thoughts  0     Difficult doing work/chores  Very difficult          Assessment & Plan:    Patient Active Problem List   Diagnosis Date Noted   Cellulitis 06/30/2023   Gastroesophageal reflux disease    Hypokalemia 06/10/2022   Hyponatremia 06/10/2022   Scrotal abscess 03/05/2021   Marijuana use 03/05/2021   Proctitis 03/05/2021   Hypoalbuminemia  due to protein-calorie malnutrition (HCC) 03/05/2021   Perineal abscess    Peritonsillar abscess 07/23/2017   Homelessness 02/02/2017   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   No orders of the defined types were placed in this encounter.    Follow-up: No follow-ups on file. or sooner if needed.    Marcos Eke, MSN, FNP-C Nurse Practitioner [facility] Main number: [phone]

## 2023-08-17 ENCOUNTER — Ambulatory Visit: Payer: Self-pay | Admitting: Family

## 2023-08-17 ENCOUNTER — Ambulatory Visit: Payer: Self-pay

## 2023-08-17 ENCOUNTER — Telehealth: Payer: Self-pay

## 2023-08-17 NOTE — Telephone Encounter (Signed)
Attempted to call patient regarding missed appointment today. Left voicemail requesting call back to reschedule. Juanita Laster, RMA

## 2023-10-21 ENCOUNTER — Ambulatory Visit: Payer: Self-pay

## 2023-10-21 ENCOUNTER — Ambulatory Visit: Payer: Self-pay | Admitting: Infectious Diseases

## 2024-02-17 ENCOUNTER — Telehealth: Payer: Self-pay

## 2024-02-17 NOTE — Telephone Encounter (Signed)
 Patient considered out of care.  Last [facility] Visit: 03-12-21  Last HIV Viral Load:  HIV 1 RNA Quant  Date Value Ref Range Status  03/12/2021 198 (H) Copies/mL Final    Last CD4 Count:  CD4 T Cell Abs  Date Value Ref Range Status  03/12/2021 292 (L) 400 - 1,790 /uL Final    Medication Dispense History:   Dispensed Days Supply Quantity Provider Pharmacy  BIKTARVY  50/200/25MG  TABLETS 04/21/2021 30 30 each Cory Dingwall, MD [facility]...  BIKTARVY  50/200/25MG  TABLETS 03/26/2021 30 30 each Cory Dingwall, MD [facility] #...    Interventions: Called [facility]ie to offer appointment, no answer. Left HIPAA compliant voicemail requesting callback.    Duration of Services: 5 minutes  Elowyn Raupp, BSN, Charity fundraiser

## 2024-03-15 ENCOUNTER — Telehealth: Payer: Self-pay

## 2024-03-15 NOTE — Telephone Encounter (Signed)
 Called patient to offer appointment, no answer and voicemail box not set up.   Angelik Walls, BSN, RN

## 2024-04-19 ENCOUNTER — Telehealth: Payer: Self-pay

## 2024-04-19 NOTE — Telephone Encounter (Signed)
 Called patient to offer appointment. No answer and no VM set up.   Emon Miggins, BSN, RN

## 2024-06-06 ENCOUNTER — Telehealth: Payer: Self-pay

## 2024-06-06 NOTE — Telephone Encounter (Signed)
 Called [facility]ie to offer appointment, no answer. Left HIPAA compliant voicemail requesting callback.   Called patient's mother and father, no answer on either number. Left HIPAA compliant voicemails requesting call back if they had any updated contact info for [facility].    Yuta Cipollone, BSN, RN

## 2024-11-08 ENCOUNTER — Telehealth: Payer: Self-pay

## 2024-11-08 NOTE — Telephone Encounter (Signed)
 Called [facility] to offer appointment, no answer and no voicemail set up.   Lylith Bebeau, BSN, RN
